# Patient Record
Sex: Male | Born: 1935 | Race: White | Hispanic: No | Smoking: Never smoker
Health system: Southern US, Community
[De-identification: ages and names within clinical notes are randomized; demographics above are authoritative.]

## PROBLEM LIST (undated history)

## (undated) DIAGNOSIS — I10 Essential (primary) hypertension: Secondary | ICD-10-CM

## (undated) DIAGNOSIS — M1990 Unspecified osteoarthritis, unspecified site: Secondary | ICD-10-CM

## (undated) NOTE — *Deleted (*Deleted)
Medical screening examination/treatment/procedure(s) were conducted as a shared visit with non-physician practitioner(s) and myself.  I personally evaluated the patient during the encounter  

---

## 2014-12-21 DIAGNOSIS — R569 Unspecified convulsions: Secondary | ICD-10-CM | POA: Insufficient documentation

## 2015-04-24 DIAGNOSIS — G8929 Other chronic pain: Secondary | ICD-10-CM | POA: Insufficient documentation

## 2015-04-24 DIAGNOSIS — R413 Other amnesia: Secondary | ICD-10-CM | POA: Insufficient documentation

## 2015-04-24 DIAGNOSIS — R251 Tremor, unspecified: Secondary | ICD-10-CM | POA: Insufficient documentation

## 2017-03-25 DIAGNOSIS — R69 Illness, unspecified: Secondary | ICD-10-CM | POA: Diagnosis not present

## 2017-04-10 DIAGNOSIS — R69 Illness, unspecified: Secondary | ICD-10-CM | POA: Diagnosis not present

## 2017-04-10 DIAGNOSIS — N400 Enlarged prostate without lower urinary tract symptoms: Secondary | ICD-10-CM | POA: Insufficient documentation

## 2017-04-10 DIAGNOSIS — R0602 Shortness of breath: Secondary | ICD-10-CM | POA: Diagnosis not present

## 2017-04-10 DIAGNOSIS — M7989 Other specified soft tissue disorders: Secondary | ICD-10-CM | POA: Diagnosis not present

## 2017-04-10 DIAGNOSIS — R030 Elevated blood-pressure reading, without diagnosis of hypertension: Secondary | ICD-10-CM | POA: Diagnosis not present

## 2017-04-10 DIAGNOSIS — R079 Chest pain, unspecified: Secondary | ICD-10-CM | POA: Diagnosis not present

## 2017-04-10 DIAGNOSIS — I10 Essential (primary) hypertension: Secondary | ICD-10-CM | POA: Diagnosis not present

## 2017-04-10 DIAGNOSIS — R0789 Other chest pain: Secondary | ICD-10-CM | POA: Diagnosis not present

## 2017-04-10 DIAGNOSIS — I160 Hypertensive urgency: Secondary | ICD-10-CM | POA: Diagnosis not present

## 2017-04-10 DIAGNOSIS — R42 Dizziness and giddiness: Secondary | ICD-10-CM | POA: Diagnosis not present

## 2017-04-10 DIAGNOSIS — Z79899 Other long term (current) drug therapy: Secondary | ICD-10-CM | POA: Diagnosis not present

## 2017-04-10 DIAGNOSIS — R748 Abnormal levels of other serum enzymes: Secondary | ICD-10-CM | POA: Diagnosis not present

## 2017-04-10 DIAGNOSIS — F17220 Nicotine dependence, chewing tobacco, uncomplicated: Secondary | ICD-10-CM | POA: Diagnosis not present

## 2017-04-11 DIAGNOSIS — I160 Hypertensive urgency: Secondary | ICD-10-CM | POA: Diagnosis not present

## 2017-04-11 DIAGNOSIS — N400 Enlarged prostate without lower urinary tract symptoms: Secondary | ICD-10-CM | POA: Diagnosis not present

## 2017-04-11 DIAGNOSIS — R079 Chest pain, unspecified: Secondary | ICD-10-CM | POA: Diagnosis not present

## 2017-04-12 DIAGNOSIS — N400 Enlarged prostate without lower urinary tract symptoms: Secondary | ICD-10-CM | POA: Diagnosis not present

## 2017-04-12 DIAGNOSIS — I160 Hypertensive urgency: Secondary | ICD-10-CM | POA: Diagnosis not present

## 2017-04-12 DIAGNOSIS — R079 Chest pain, unspecified: Secondary | ICD-10-CM | POA: Diagnosis not present

## 2017-04-24 DIAGNOSIS — Z1509 Genetic susceptibility to other malignant neoplasm: Secondary | ICD-10-CM | POA: Diagnosis not present

## 2017-04-24 DIAGNOSIS — R0602 Shortness of breath: Secondary | ICD-10-CM | POA: Diagnosis not present

## 2017-04-24 DIAGNOSIS — I10 Essential (primary) hypertension: Secondary | ICD-10-CM | POA: Diagnosis not present

## 2017-04-24 DIAGNOSIS — Z8051 Family history of malignant neoplasm of kidney: Secondary | ICD-10-CM | POA: Diagnosis not present

## 2017-04-24 DIAGNOSIS — Z85828 Personal history of other malignant neoplasm of skin: Secondary | ICD-10-CM | POA: Diagnosis not present

## 2017-04-24 DIAGNOSIS — Z1379 Encounter for other screening for genetic and chromosomal anomalies: Secondary | ICD-10-CM | POA: Diagnosis not present

## 2017-04-24 DIAGNOSIS — Z808 Family history of malignant neoplasm of other organs or systems: Secondary | ICD-10-CM | POA: Diagnosis not present

## 2017-04-25 DIAGNOSIS — Z1379 Encounter for other screening for genetic and chromosomal anomalies: Secondary | ICD-10-CM | POA: Diagnosis not present

## 2017-04-25 DIAGNOSIS — I209 Angina pectoris, unspecified: Secondary | ICD-10-CM | POA: Diagnosis not present

## 2017-04-25 DIAGNOSIS — R112 Nausea with vomiting, unspecified: Secondary | ICD-10-CM | POA: Diagnosis not present

## 2017-04-25 DIAGNOSIS — R0602 Shortness of breath: Secondary | ICD-10-CM | POA: Diagnosis not present

## 2017-04-25 DIAGNOSIS — R69 Illness, unspecified: Secondary | ICD-10-CM | POA: Diagnosis not present

## 2017-04-29 DIAGNOSIS — I10 Essential (primary) hypertension: Secondary | ICD-10-CM | POA: Diagnosis not present

## 2017-07-17 DIAGNOSIS — M79644 Pain in right finger(s): Secondary | ICD-10-CM | POA: Diagnosis not present

## 2017-07-17 DIAGNOSIS — I10 Essential (primary) hypertension: Secondary | ICD-10-CM | POA: Diagnosis not present

## 2017-07-17 DIAGNOSIS — W01198A Fall on same level from slipping, tripping and stumbling with subsequent striking against other object, initial encounter: Secondary | ICD-10-CM | POA: Diagnosis not present

## 2017-07-31 DIAGNOSIS — M79644 Pain in right finger(s): Secondary | ICD-10-CM | POA: Diagnosis not present

## 2017-07-31 DIAGNOSIS — I10 Essential (primary) hypertension: Secondary | ICD-10-CM | POA: Diagnosis not present

## 2017-08-25 DIAGNOSIS — M79644 Pain in right finger(s): Secondary | ICD-10-CM | POA: Diagnosis not present

## 2017-08-27 DIAGNOSIS — S62522A Displaced fracture of distal phalanx of left thumb, initial encounter for closed fracture: Secondary | ICD-10-CM | POA: Diagnosis not present

## 2017-09-16 DIAGNOSIS — M25562 Pain in left knee: Secondary | ICD-10-CM | POA: Diagnosis not present

## 2017-09-16 DIAGNOSIS — M25561 Pain in right knee: Secondary | ICD-10-CM | POA: Diagnosis not present

## 2017-09-16 DIAGNOSIS — N401 Enlarged prostate with lower urinary tract symptoms: Secondary | ICD-10-CM | POA: Diagnosis not present

## 2017-09-16 DIAGNOSIS — R252 Cramp and spasm: Secondary | ICD-10-CM | POA: Diagnosis not present

## 2017-09-16 DIAGNOSIS — I10 Essential (primary) hypertension: Secondary | ICD-10-CM | POA: Diagnosis not present

## 2017-09-23 DIAGNOSIS — E612 Magnesium deficiency: Secondary | ICD-10-CM | POA: Diagnosis not present

## 2017-09-23 DIAGNOSIS — R509 Fever, unspecified: Secondary | ICD-10-CM | POA: Diagnosis not present

## 2017-09-23 DIAGNOSIS — R799 Abnormal finding of blood chemistry, unspecified: Secondary | ICD-10-CM | POA: Diagnosis not present

## 2017-09-23 DIAGNOSIS — I700 Atherosclerosis of aorta: Secondary | ICD-10-CM | POA: Diagnosis not present

## 2018-03-22 DIAGNOSIS — R69 Illness, unspecified: Secondary | ICD-10-CM | POA: Diagnosis not present

## 2018-05-25 DIAGNOSIS — Z6830 Body mass index (BMI) 30.0-30.9, adult: Secondary | ICD-10-CM | POA: Diagnosis not present

## 2018-05-25 DIAGNOSIS — Z809 Family history of malignant neoplasm, unspecified: Secondary | ICD-10-CM | POA: Diagnosis not present

## 2018-05-25 DIAGNOSIS — R32 Unspecified urinary incontinence: Secondary | ICD-10-CM | POA: Diagnosis not present

## 2018-05-25 DIAGNOSIS — N400 Enlarged prostate without lower urinary tract symptoms: Secondary | ICD-10-CM | POA: Diagnosis not present

## 2018-05-25 DIAGNOSIS — I10 Essential (primary) hypertension: Secondary | ICD-10-CM | POA: Diagnosis not present

## 2018-05-25 DIAGNOSIS — E669 Obesity, unspecified: Secondary | ICD-10-CM | POA: Diagnosis not present

## 2018-05-25 DIAGNOSIS — Z89421 Acquired absence of other right toe(s): Secondary | ICD-10-CM | POA: Diagnosis not present

## 2018-05-25 DIAGNOSIS — Z823 Family history of stroke: Secondary | ICD-10-CM | POA: Diagnosis not present

## 2018-05-25 DIAGNOSIS — Z7722 Contact with and (suspected) exposure to environmental tobacco smoke (acute) (chronic): Secondary | ICD-10-CM | POA: Diagnosis not present

## 2018-05-25 DIAGNOSIS — Z803 Family history of malignant neoplasm of breast: Secondary | ICD-10-CM | POA: Diagnosis not present

## 2018-08-23 DIAGNOSIS — I10 Essential (primary) hypertension: Secondary | ICD-10-CM | POA: Diagnosis not present

## 2018-08-23 DIAGNOSIS — M25551 Pain in right hip: Secondary | ICD-10-CM | POA: Diagnosis not present

## 2018-08-23 DIAGNOSIS — S8991XA Unspecified injury of right lower leg, initial encounter: Secondary | ICD-10-CM | POA: Diagnosis not present

## 2018-08-23 DIAGNOSIS — M7989 Other specified soft tissue disorders: Secondary | ICD-10-CM | POA: Diagnosis not present

## 2018-08-23 DIAGNOSIS — S79911A Unspecified injury of right hip, initial encounter: Secondary | ICD-10-CM | POA: Diagnosis not present

## 2018-08-23 DIAGNOSIS — S80919A Unspecified superficial injury of unspecified knee, initial encounter: Secondary | ICD-10-CM | POA: Diagnosis not present

## 2018-08-23 DIAGNOSIS — S79912A Unspecified injury of left hip, initial encounter: Secondary | ICD-10-CM | POA: Diagnosis not present

## 2018-08-23 DIAGNOSIS — M25561 Pain in right knee: Secondary | ICD-10-CM | POA: Diagnosis not present

## 2018-08-23 DIAGNOSIS — R52 Pain, unspecified: Secondary | ICD-10-CM | POA: Diagnosis not present

## 2018-09-01 DIAGNOSIS — Z89421 Acquired absence of other right toe(s): Secondary | ICD-10-CM | POA: Diagnosis not present

## 2018-09-01 DIAGNOSIS — E669 Obesity, unspecified: Secondary | ICD-10-CM | POA: Diagnosis not present

## 2018-09-01 DIAGNOSIS — Z6830 Body mass index (BMI) 30.0-30.9, adult: Secondary | ICD-10-CM | POA: Diagnosis not present

## 2018-09-01 DIAGNOSIS — R32 Unspecified urinary incontinence: Secondary | ICD-10-CM | POA: Diagnosis not present

## 2018-09-01 DIAGNOSIS — Z791 Long term (current) use of non-steroidal anti-inflammatories (NSAID): Secondary | ICD-10-CM | POA: Diagnosis not present

## 2018-09-01 DIAGNOSIS — N400 Enlarged prostate without lower urinary tract symptoms: Secondary | ICD-10-CM | POA: Diagnosis not present

## 2018-09-01 DIAGNOSIS — Z89411 Acquired absence of right great toe: Secondary | ICD-10-CM | POA: Diagnosis not present

## 2018-09-01 DIAGNOSIS — I10 Essential (primary) hypertension: Secondary | ICD-10-CM | POA: Diagnosis not present

## 2018-09-01 DIAGNOSIS — G8929 Other chronic pain: Secondary | ICD-10-CM | POA: Diagnosis not present

## 2018-09-01 DIAGNOSIS — Z809 Family history of malignant neoplasm, unspecified: Secondary | ICD-10-CM | POA: Diagnosis not present

## 2018-09-27 DIAGNOSIS — E539 Vitamin B deficiency, unspecified: Secondary | ICD-10-CM | POA: Diagnosis not present

## 2018-09-27 DIAGNOSIS — I10 Essential (primary) hypertension: Secondary | ICD-10-CM | POA: Diagnosis not present

## 2018-09-27 DIAGNOSIS — N401 Enlarged prostate with lower urinary tract symptoms: Secondary | ICD-10-CM | POA: Diagnosis not present

## 2018-09-27 DIAGNOSIS — R296 Repeated falls: Secondary | ICD-10-CM | POA: Diagnosis not present

## 2018-09-30 DIAGNOSIS — N401 Enlarged prostate with lower urinary tract symptoms: Secondary | ICD-10-CM | POA: Diagnosis not present

## 2018-09-30 DIAGNOSIS — R296 Repeated falls: Secondary | ICD-10-CM | POA: Diagnosis not present

## 2018-09-30 DIAGNOSIS — I10 Essential (primary) hypertension: Secondary | ICD-10-CM | POA: Diagnosis not present

## 2018-09-30 DIAGNOSIS — E539 Vitamin B deficiency, unspecified: Secondary | ICD-10-CM | POA: Diagnosis not present

## 2018-10-14 DIAGNOSIS — D519 Vitamin B12 deficiency anemia, unspecified: Secondary | ICD-10-CM | POA: Diagnosis not present

## 2018-10-14 DIAGNOSIS — I10 Essential (primary) hypertension: Secondary | ICD-10-CM | POA: Diagnosis not present

## 2018-10-14 DIAGNOSIS — D529 Folate deficiency anemia, unspecified: Secondary | ICD-10-CM | POA: Diagnosis not present

## 2018-10-14 DIAGNOSIS — E559 Vitamin D deficiency, unspecified: Secondary | ICD-10-CM | POA: Diagnosis not present

## 2018-10-14 DIAGNOSIS — R799 Abnormal finding of blood chemistry, unspecified: Secondary | ICD-10-CM | POA: Diagnosis not present

## 2018-10-25 DIAGNOSIS — R799 Abnormal finding of blood chemistry, unspecified: Secondary | ICD-10-CM | POA: Diagnosis not present

## 2018-10-25 DIAGNOSIS — E559 Vitamin D deficiency, unspecified: Secondary | ICD-10-CM | POA: Diagnosis not present

## 2018-10-25 DIAGNOSIS — Z9119 Patient's noncompliance with other medical treatment and regimen: Secondary | ICD-10-CM | POA: Diagnosis not present

## 2018-12-06 DIAGNOSIS — R0902 Hypoxemia: Secondary | ICD-10-CM | POA: Diagnosis not present

## 2018-12-06 DIAGNOSIS — M25511 Pain in right shoulder: Secondary | ICD-10-CM | POA: Diagnosis not present

## 2018-12-06 DIAGNOSIS — M25519 Pain in unspecified shoulder: Secondary | ICD-10-CM | POA: Diagnosis not present

## 2018-12-06 DIAGNOSIS — R0689 Other abnormalities of breathing: Secondary | ICD-10-CM | POA: Diagnosis not present

## 2018-12-06 DIAGNOSIS — R231 Pallor: Secondary | ICD-10-CM | POA: Diagnosis not present

## 2018-12-06 DIAGNOSIS — S299XXA Unspecified injury of thorax, initial encounter: Secondary | ICD-10-CM | POA: Diagnosis not present

## 2018-12-06 DIAGNOSIS — M546 Pain in thoracic spine: Secondary | ICD-10-CM | POA: Diagnosis not present

## 2018-12-06 DIAGNOSIS — S0990XA Unspecified injury of head, initial encounter: Secondary | ICD-10-CM | POA: Diagnosis not present

## 2018-12-06 DIAGNOSIS — R410 Disorientation, unspecified: Secondary | ICD-10-CM | POA: Diagnosis not present

## 2018-12-06 DIAGNOSIS — R52 Pain, unspecified: Secondary | ICD-10-CM | POA: Diagnosis not present

## 2018-12-14 DIAGNOSIS — E876 Hypokalemia: Secondary | ICD-10-CM | POA: Diagnosis not present

## 2018-12-14 DIAGNOSIS — F0390 Unspecified dementia without behavioral disturbance: Secondary | ICD-10-CM | POA: Diagnosis not present

## 2018-12-14 DIAGNOSIS — R69 Illness, unspecified: Secondary | ICD-10-CM | POA: Diagnosis not present

## 2018-12-14 DIAGNOSIS — Z79899 Other long term (current) drug therapy: Secondary | ICD-10-CM | POA: Diagnosis not present

## 2018-12-14 DIAGNOSIS — E785 Hyperlipidemia, unspecified: Secondary | ICD-10-CM | POA: Diagnosis not present

## 2018-12-14 DIAGNOSIS — Z7982 Long term (current) use of aspirin: Secondary | ICD-10-CM | POA: Diagnosis not present

## 2018-12-14 DIAGNOSIS — R42 Dizziness and giddiness: Secondary | ICD-10-CM | POA: Diagnosis not present

## 2018-12-14 DIAGNOSIS — I340 Nonrheumatic mitral (valve) insufficiency: Secondary | ICD-10-CM

## 2018-12-14 DIAGNOSIS — Z6822 Body mass index (BMI) 22.0-22.9, adult: Secondary | ICD-10-CM | POA: Diagnosis not present

## 2018-12-14 DIAGNOSIS — I10 Essential (primary) hypertension: Secondary | ICD-10-CM | POA: Diagnosis not present

## 2018-12-14 DIAGNOSIS — I361 Nonrheumatic tricuspid (valve) insufficiency: Secondary | ICD-10-CM

## 2018-12-14 DIAGNOSIS — I4891 Unspecified atrial fibrillation: Secondary | ICD-10-CM | POA: Diagnosis not present

## 2018-12-14 DIAGNOSIS — R55 Syncope and collapse: Secondary | ICD-10-CM | POA: Diagnosis not present

## 2018-12-15 ENCOUNTER — Telehealth: Payer: Self-pay

## 2018-12-15 DIAGNOSIS — E785 Hyperlipidemia, unspecified: Secondary | ICD-10-CM | POA: Diagnosis not present

## 2018-12-15 DIAGNOSIS — F0390 Unspecified dementia without behavioral disturbance: Secondary | ICD-10-CM | POA: Diagnosis not present

## 2018-12-15 DIAGNOSIS — R55 Syncope and collapse: Secondary | ICD-10-CM | POA: Diagnosis not present

## 2018-12-15 DIAGNOSIS — I4891 Unspecified atrial fibrillation: Secondary | ICD-10-CM

## 2018-12-15 DIAGNOSIS — I10 Essential (primary) hypertension: Secondary | ICD-10-CM | POA: Diagnosis not present

## 2018-12-15 DIAGNOSIS — R69 Illness, unspecified: Secondary | ICD-10-CM | POA: Diagnosis not present

## 2018-12-15 NOTE — Telephone Encounter (Signed)
Patient is currently in hospital due for release today. Dr. Docia Furl requested 2 week zio due to afib/syncope. Printed patient records/insurance information and forwarded to P.Perzee to set patient up. Scheduled 2 week f/u.

## 2018-12-16 ENCOUNTER — Telehealth: Payer: Self-pay

## 2018-12-16 ENCOUNTER — Other Ambulatory Visit (INDEPENDENT_AMBULATORY_CARE_PROVIDER_SITE_OTHER): Payer: Medicare HMO

## 2018-12-16 DIAGNOSIS — I4891 Unspecified atrial fibrillation: Secondary | ICD-10-CM | POA: Diagnosis not present

## 2018-12-16 NOTE — Telephone Encounter (Signed)
Called to verify that patient was able to get monitor on with no issues. Registered unit with [employer]. Scheduled for 2 wk f/u.

## 2018-12-16 NOTE — Addendum Note (Signed)
Addended by: Beckey Rutter on: 12/16/2018 10:21 AM   Modules accepted: Orders

## 2018-12-30 ENCOUNTER — Ambulatory Visit (INDEPENDENT_AMBULATORY_CARE_PROVIDER_SITE_OTHER): Payer: Medicare HMO | Admitting: Cardiology

## 2018-12-30 ENCOUNTER — Other Ambulatory Visit: Payer: Self-pay

## 2018-12-30 ENCOUNTER — Encounter: Payer: Self-pay | Admitting: Cardiology

## 2018-12-30 VITALS — BP 140/62 | HR 76 | Ht 72.0 in

## 2018-12-30 DIAGNOSIS — I10 Essential (primary) hypertension: Secondary | ICD-10-CM | POA: Diagnosis not present

## 2018-12-30 DIAGNOSIS — R55 Syncope and collapse: Secondary | ICD-10-CM | POA: Diagnosis not present

## 2018-12-30 DIAGNOSIS — E785 Hyperlipidemia, unspecified: Secondary | ICD-10-CM | POA: Insufficient documentation

## 2018-12-30 DIAGNOSIS — I340 Nonrheumatic mitral (valve) insufficiency: Secondary | ICD-10-CM

## 2018-12-30 DIAGNOSIS — M1990 Unspecified osteoarthritis, unspecified site: Secondary | ICD-10-CM | POA: Insufficient documentation

## 2018-12-30 NOTE — Addendum Note (Signed)
Addended by: Beckey Rutter on: 12/30/2018 10:38 AM   Modules accepted: Orders

## 2018-12-30 NOTE — Patient Instructions (Addendum)
Medication Instructions:  Your physician recommends that you continue on your current medications as directed. Please refer to the Current Medication list given to you today.  If you need a refill on your cardiac medications before your next appointment, please call your pharmacy.   Lab work: Your physician recommends that you have a TSH drawn today.  If you have labs (blood work) drawn today and your tests are completely normal, you will receive your results only by: Marland Kitchen MyChart Message (if you have MyChart) OR . A paper copy in the mail If you have any lab test that is abnormal or we need to change your treatment, we will call you to review the results.  Testing/Procedures: YOU had an EKG performed today  Follow-Up: At [facility], you and your health needs are our priority.  As part of our continuing mission to provide you with exceptional heart care, we have created designated Provider Care Teams.  These Care Teams include your primary Cardiologist (physician) and Advanced Practice Providers (APPs -  Physician Assistants and Nurse Practitioners) who all work together to provide you with the care you need, when you need it. You will need a follow up appointment in 4 months.

## 2018-12-30 NOTE — Progress Notes (Addendum)
Cardiology Office Note:    Date:  12/30/2018   ID:  Christian Pages., DOB 1932-07-13, MRN [id number]  PCP:  Barnie Mort, NP  Cardiologist:  Jenean Lindau, MD   Referring MD: Barnie Mort, NP    ASSESSMENT:    1. Syncope and collapse   2. Essential hypertension   3. Moderate mitral regurgitation    PLAN:    In order of problems listed above:  1. Syncope and collapse: Christian Warren is being evaluated for this.  I did not see her TSH in the chart and we will get this blood work done today.  His blood pressure stable.  He is on blood pressure medication amlodipine 10 mg daily according to the daughter.  He is undergoing 14-day ZIO monitoring after hospital discharge and we will review this. 2. Essential hypertension: Blood pressure stable 3. Moderate mitral regurgitation: I discussed this with the Christian Warren and daughter extensively questions were answered to their satisfaction.  Medical management at this time 4 months follow-up or earlier if he has any concerns.  In view of recurrent syncopal episodes he was advised not to drive.  His syncope will also need to be evaluated by his primary care physician for reasons other than cardiac.  I advised him about this.   Medication Adjustments/Labs and Tests Ordered: Current medicines are reviewed at length with the Christian Warren today.  Concerns regarding medicines are outlined above.  No orders of the defined types were placed in this encounter.  No orders of the defined types were placed in this encounter.    History of Present Illness:    Christian Beg. is a 85 y.o. male who is being seen today for the evaluation of atrial fibrillation and syncope at the request of Barnie Mort, NP.  Christian Warren is a pleasant 85 year old male.  He is brought in by his daughter.  He has history of essential hypertension.  He had a passing out spell and was admitted to [facility].  He has had multiple such spells in the past.  No chest pain orthopnea or  PND.  He was found to be in atrial fibrillation with well-controlled ventricular rates, moderate mitral regurgitation and left atrial enlargement.  He is unsteady on his gait as he walks into the clinic.  At the time of my evaluation, the Christian Warren is alert awake oriented and in no distress.  No past medical history on file.  [place] records were reviewed extensively Current Medications: Current Meds  Medication Sig  . tamsulosin (FLOMAX) 0.4 MG CAPS capsule Take 1 capsule by mouth daily.     Allergies:   Christian Warren has no known allergies.   Social History   Socioeconomic History  . Marital status: Divorced    Spouse name: Not on file  . Number of children: Not on file  . Years of education: Not on file  . Highest education level: Not on file  Occupational History  . Not on file  Social Needs  . Financial resource strain: Not on file  . Food insecurity    Worry: Not on file    Inability: Not on file  . Transportation needs    Medical: Not on file    Non-medical: Not on file  Tobacco Use  . Smoking status: Never Smoker  . Smokeless tobacco: Never Used  Substance and Sexual Activity  . Alcohol use: Not on file  . Drug use: Not on file  . Sexual activity: Not on file  Lifestyle  . Physical activity    Days per week: Not on file    Minutes per session: Not on file  . Stress: Not on file  Relationships  . Social Musicianconnections    Talks on phone: Not on file    Gets together: Not on file    Attends religious service: Not on file    Active member of club or organization: Not on file    Attends meetings of clubs or organizations: Not on file    Relationship status: Not on file  Other Topics Concern  . Not on file  Social History Narrative  . Not on file     Family History: The Christian Warren's family history is not on file.  ROS:   Please see the history of present illness.    All other systems reviewed and are negative.  EKGs/Labs/Other Studies Reviewed:    The  following studies were reviewed today[facility].  Christian Warren has moderate mitral regurgitation.  Ejection fraction is normal.   Recent Labs: No results found for requested labs within last 8760 hours.  Recent Lipid Panel No results found for: CHOL, TRIG, HDL, CHOLHDL, VLDL, LDLCALC, LDLDIRECT  Physical Exam:    VS:  BP 140/62 (BP Location: Left Arm, Christian Warren Position: Sitting, Cuff Size: Normal)   Pulse 76   Ht 6' (1.829 m)   SpO2 98%     Wt Readings from Last 3 Encounters:  No data found for Wt     GEN: Christian Warren is in no acute distress HEENT: Normal NECK: No JVD; No carotid bruits LYMPHATICS: No lymphadenopathy CARDIAC: S1 S2 irregular, 2/6 systolic murmur at the apex. RESPIRATORY:  Clear to auscultation without rales, wheezing or rhonchi  ABDOMEN: Soft, non-tender, non-distended MUSCULOSKELETAL:  No edema; No deformity  SKIN: Warm and dry NEUROLOGIC:  Alert and oriented x 3 PSYCHIATRIC:  Normal affect    Signed, Garwin Brothersajan R Kasai Beltran, MD  12/30/2018 10:28 AM    [facility]

## 2018-12-31 ENCOUNTER — Telehealth: Payer: Self-pay

## 2018-12-31 LAB — TSH: TSH: 3.8 u[IU]/mL (ref 0.450–4.500)

## 2018-12-31 NOTE — Telephone Encounter (Signed)
-----   Message from Rajan R Revankar, MD sent at 12/31/2018  8:02 AM EDT ----- The results of the study is unremarkable. Please inform patient. I will discuss in detail at next appointment. Cc  primary care/referring physician Rajan R Revankar, MD 12/31/2018 8:02 AM 

## 2019-01-04 DIAGNOSIS — I4819 Other persistent atrial fibrillation: Secondary | ICD-10-CM | POA: Diagnosis not present

## 2019-01-04 DIAGNOSIS — I472 Ventricular tachycardia: Secondary | ICD-10-CM | POA: Diagnosis not present

## 2019-01-04 DIAGNOSIS — I4891 Unspecified atrial fibrillation: Secondary | ICD-10-CM | POA: Diagnosis not present

## 2019-01-12 ENCOUNTER — Telehealth: Payer: Self-pay

## 2019-01-12 DIAGNOSIS — R55 Syncope and collapse: Secondary | ICD-10-CM

## 2019-01-12 DIAGNOSIS — I10 Essential (primary) hypertension: Secondary | ICD-10-CM

## 2019-01-12 NOTE — Telephone Encounter (Signed)
Patient notified to come to [facility] for repeat blood work and scheduled Lexi. Spoke with his niece Malachy Mood with date/time. Patient will request printed copy of lexi instruction on day of lab visit.  Copy of results sent to PCP.

## 2019-01-12 NOTE — Telephone Encounter (Signed)
-----   Message from Austin Miles, RN sent at 01/11/2019 11:26 AM EDT -----  ----- Message ----- From: Jenean Lindau, MD Sent: 01/11/2019  11:06 AM EDT To: Susie Cassette, RN  In view of the above I would like to get a Chem-7, magnesium level, TSH and Lexiscan sestamibi in the next week or 2. Jenean Lindau, MD 01/11/2019 11:06 AM

## 2019-02-17 ENCOUNTER — Telehealth (HOSPITAL_COMMUNITY): Payer: Self-pay | Admitting: *Deleted

## 2019-02-17 NOTE — Telephone Encounter (Signed)
Left message on voicemail in reference to upcoming appointment scheduled for 02/22/19. Phone number given for a call back so details instructions can be given. Christian Warren   

## 2019-04-19 ENCOUNTER — Telehealth: Payer: Self-pay

## 2019-04-19 DIAGNOSIS — I10 Essential (primary) hypertension: Secondary | ICD-10-CM

## 2019-04-19 NOTE — Telephone Encounter (Signed)
-----   Message from Jenean Lindau, MD sent at 04/12/2019 12:52 PM EST ----- Not sure whether his potassium was replaced.  Please check with him if not bring him back for a Chem-7 I will let his primary care handle it depend on patient choice ----- Message ----- From: Caleen Essex Sent: 04/12/2019  10:07 AM EST To: Jenean Lindau, MD

## 2019-04-19 NOTE — Telephone Encounter (Signed)
Patient will come into office 11/11 or 11/12 for repeat BMP.

## 2019-04-20 DIAGNOSIS — I10 Essential (primary) hypertension: Secondary | ICD-10-CM | POA: Diagnosis not present

## 2019-04-20 DIAGNOSIS — Z136 Encounter for screening for cardiovascular disorders: Secondary | ICD-10-CM | POA: Diagnosis not present

## 2019-04-20 DIAGNOSIS — E559 Vitamin D deficiency, unspecified: Secondary | ICD-10-CM | POA: Diagnosis not present

## 2019-04-20 DIAGNOSIS — I4891 Unspecified atrial fibrillation: Secondary | ICD-10-CM | POA: Diagnosis not present

## 2019-05-02 ENCOUNTER — Ambulatory Visit (INDEPENDENT_AMBULATORY_CARE_PROVIDER_SITE_OTHER): Payer: Medicare HMO | Admitting: Cardiology

## 2019-05-02 ENCOUNTER — Other Ambulatory Visit: Payer: Self-pay

## 2019-05-02 ENCOUNTER — Encounter: Payer: Self-pay | Admitting: Cardiology

## 2019-05-02 VITALS — BP 146/72 | HR 73 | Ht 72.0 in | Wt 212.8 lb

## 2019-05-02 DIAGNOSIS — I10 Essential (primary) hypertension: Secondary | ICD-10-CM | POA: Diagnosis not present

## 2019-05-02 DIAGNOSIS — R55 Syncope and collapse: Secondary | ICD-10-CM | POA: Diagnosis not present

## 2019-05-02 DIAGNOSIS — R413 Other amnesia: Secondary | ICD-10-CM | POA: Diagnosis not present

## 2019-05-02 NOTE — Patient Instructions (Signed)
Medication Instructions:  Your physician recommends that you continue on your current medications as directed. Please refer to the Current Medication list given to you today.  *If you need a refill on your cardiac medications before your next appointment, please call your pharmacy*  Lab Work: None.  If you have labs (blood work) drawn today and your tests are completely normal, you will receive your results only by: . MyChart Message (if you have MyChart) OR . A paper copy in the mail If you have any lab test that is abnormal or we need to change your treatment, we will call you to review the results.  Testing/Procedures: None.   Follow-Up: At [facility], you and your health needs are our priority.  As part of our continuing mission to provide you with exceptional heart care, we have created designated Provider Care Teams.  These Care Teams include your primary Cardiologist (physician) and Advanced Practice Providers (APPs -  Physician Assistants and Nurse Practitioners) who all work together to provide you with the care you need, when you need it.  Your next appointment:   3 month(s)  The format for your next appointment:   In Person  Provider:   Rajan Revankar, MD  Other Instructions   

## 2019-05-02 NOTE — Progress Notes (Signed)
Cardiology Office Note:    Date:  05/02/2019   ID:  Christian Gentile., DOB 07/25/1932, MRN [id number]  PCP:  Retia Passe, NP  Cardiologist:  Garwin Brothers, MD   Referring MD: Retia Passe, NP    ASSESSMENT:    No diagnosis found. PLAN:    In order of problems listed above:  1. Nonsustained ventricular tachycardia: I discussed findings with the patient at extensive length.  Echocardiogram was unremarkable.  His blood work was recently done by his primary care physician and he was told that it was fine.  I discussed with him about stress testing in view of nonsustained ventricular tachycardia him he is not keen on it and respect his wishes.  Risks including fatal and fatal arrhythmias were discussed and he vocalized understanding and I discussed this with his needs to. 2. Essential hypertension: Blood pressure stable.  I would not put him on her beta-blocker at this time because of his minimum heart rate being fairly low. 3. Patient will be seen in follow-up appointment in 3 months or earlier if the patient has any concerns    Medication Adjustments/Labs and Tests Ordered: Current medicines are reviewed at length with the patient today.  Concerns regarding medicines are outlined above.  No orders of the defined types were placed in this encounter.  No orders of the defined types were placed in this encounter.    No chief complaint on file.    History of Present Illness:    Christian Mom. is a 85 y.o. male.  Patient has past medical history of essential hypertension.  He was evaluated for syncope and blood work was unremarkable.  He denies any chest pain orthopnea or PND.  His echocardiogram was unremarkable and Holter monitoring revealed nonsustained ventricular tachycardia and he is here for follow-up.  History reviewed. No pertinent past medical history.  History reviewed. No pertinent surgical history.  Current Medications: No outpatient medications have been  marked as taking for the 05/02/19 encounter (Appointment) with Courtenay Creger, Aundra Dubin, MD.     Allergies:   Patient has no known allergies.   Social History   Socioeconomic History  . Marital status: Divorced    Spouse name: Not on file  . Number of children: Not on file  . Years of education: Not on file  . Highest education level: Not on file  Occupational History  . Not on file  Social Needs  . Financial resource strain: Not on file  . Food insecurity    Worry: Not on file    Inability: Not on file  . Transportation needs    Medical: Not on file    Non-medical: Not on file  Tobacco Use  . Smoking status: Never Smoker  . Smokeless tobacco: Never Used  Substance and Sexual Activity  . Alcohol use: Not on file  . Drug use: Not on file  . Sexual activity: Not on file  Lifestyle  . Physical activity    Days per week: Not on file    Minutes per session: Not on file  . Stress: Not on file  Relationships  . Social Musician on phone: Not on file    Gets together: Not on file    Attends religious service: Not on file    Active member of club or organization: Not on file    Attends meetings of clubs or organizations: Not on file    Relationship status: Not on file  Other  Topics Concern  . Not on file  Social History Narrative  . Not on file     Family History: The patient's family history is not on file.  ROS:   Please see the history of present illness.    All other systems reviewed and are negative.  EKGs/Labs/Other Studies Reviewed:    The following studies were reviewed today:   EVENT MONITOR REPORT:  Patient was monitored from 12/15/2018 to 12/29/2018. Indication:                    Atrial fibrillation Ordering physician:  Jenean Lindau, MD  Referring physician:        Jenean Lindau, MD    Baseline rhythm: Fibrillation  Minimum heart rate: 36 BPM.  Average heart rate: 73 BPM.  Maximal heart rate 178 BPM.  Atrial arrhythmia: Atrial  fibrillation  Ventricular arrhythmia: PVCs seen.  2 nonsustained ventricular tachycardia's, longest was 7 beats  Conduction abnormality: None significant  Symptoms: None significant   Conclusion:  Atrial fibrillation.  2 nonsustained ventricular tachycardia episodes, longest was 7 beats.  Interpreting  cardiologist: Jenean Lindau, MD  Date: 01/09/2019 12:37 AM   Recent Labs: 12/30/2018: TSH 3.800  Recent Lipid Panel No results found for: CHOL, TRIG, HDL, CHOLHDL, VLDL, LDLCALC, LDLDIRECT  Physical Exam:    VS:  There were no vitals taken for this visit.    Wt Readings from Last 3 Encounters:  No data found for Wt     GEN: Patient is in no acute distress HEENT: Normal NECK: No JVD; No carotid bruits LYMPHATICS: No lymphadenopathy CARDIAC: Hear sounds regular, 2/6 systolic murmur at the apex. RESPIRATORY:  Clear to auscultation without rales, wheezing or rhonchi  ABDOMEN: Soft, non-tender, non-distended MUSCULOSKELETAL:  No edema; No deformity  SKIN: Warm and dry NEUROLOGIC:  Alert and oriented x 3 PSYCHIATRIC:  Normal affect   Signed, Jenean Lindau, MD  05/02/2019 9:50 AM    Christian Warren

## 2019-08-02 ENCOUNTER — Ambulatory Visit: Payer: Medicare HMO | Admitting: Cardiology

## 2020-01-03 DIAGNOSIS — Z9119 Patient's noncompliance with other medical treatment and regimen: Secondary | ICD-10-CM | POA: Diagnosis not present

## 2020-01-03 DIAGNOSIS — R296 Repeated falls: Secondary | ICD-10-CM | POA: Diagnosis not present

## 2020-01-03 DIAGNOSIS — E539 Vitamin B deficiency, unspecified: Secondary | ICD-10-CM | POA: Diagnosis not present

## 2020-01-03 DIAGNOSIS — E559 Vitamin D deficiency, unspecified: Secondary | ICD-10-CM | POA: Diagnosis not present

## 2020-01-03 DIAGNOSIS — I10 Essential (primary) hypertension: Secondary | ICD-10-CM | POA: Diagnosis not present

## 2020-01-31 DIAGNOSIS — S3993XA Unspecified injury of pelvis, initial encounter: Secondary | ICD-10-CM | POA: Diagnosis not present

## 2020-01-31 DIAGNOSIS — S51811A Laceration without foreign body of right forearm, initial encounter: Secondary | ICD-10-CM | POA: Diagnosis not present

## 2020-01-31 DIAGNOSIS — R531 Weakness: Secondary | ICD-10-CM | POA: Diagnosis not present

## 2020-01-31 DIAGNOSIS — S0990XA Unspecified injury of head, initial encounter: Secondary | ICD-10-CM | POA: Diagnosis not present

## 2020-01-31 DIAGNOSIS — S199XXA Unspecified injury of neck, initial encounter: Secondary | ICD-10-CM | POA: Diagnosis not present

## 2020-01-31 DIAGNOSIS — S299XXA Unspecified injury of thorax, initial encounter: Secondary | ICD-10-CM | POA: Diagnosis not present

## 2020-02-04 ENCOUNTER — Emergency Department (HOSPITAL_COMMUNITY): Payer: Medicare HMO

## 2020-02-04 ENCOUNTER — Encounter (HOSPITAL_COMMUNITY): Payer: Self-pay

## 2020-02-04 ENCOUNTER — Other Ambulatory Visit: Payer: Self-pay

## 2020-02-04 ENCOUNTER — Inpatient Hospital Stay (HOSPITAL_COMMUNITY)
Admission: EM | Admit: 2020-02-04 | Discharge: 2020-02-08 | DRG: 641 | Disposition: A | Discharge status: Skilled Nursing Facility | Payer: Medicare HMO | Attending: Internal Medicine | Admitting: Internal Medicine

## 2020-02-04 DIAGNOSIS — S32040A Wedge compression fracture of fourth lumbar vertebra, initial encounter for closed fracture: Secondary | ICD-10-CM | POA: Diagnosis not present

## 2020-02-04 DIAGNOSIS — R748 Abnormal levels of other serum enzymes: Secondary | ICD-10-CM

## 2020-02-04 DIAGNOSIS — W010XXA Fall on same level from slipping, tripping and stumbling without subsequent striking against object, initial encounter: Secondary | ICD-10-CM | POA: Diagnosis present

## 2020-02-04 DIAGNOSIS — I10 Essential (primary) hypertension: Secondary | ICD-10-CM | POA: Diagnosis present

## 2020-02-04 DIAGNOSIS — E538 Deficiency of other specified B group vitamins: Secondary | ICD-10-CM | POA: Diagnosis present

## 2020-02-04 DIAGNOSIS — R531 Weakness: Secondary | ICD-10-CM | POA: Diagnosis not present

## 2020-02-04 DIAGNOSIS — Z7982 Long term (current) use of aspirin: Secondary | ICD-10-CM | POA: Diagnosis not present

## 2020-02-04 DIAGNOSIS — E785 Hyperlipidemia, unspecified: Secondary | ICD-10-CM | POA: Diagnosis present

## 2020-02-04 DIAGNOSIS — Z23 Encounter for immunization: Secondary | ICD-10-CM

## 2020-02-04 DIAGNOSIS — Z79899 Other long term (current) drug therapy: Secondary | ICD-10-CM | POA: Diagnosis not present

## 2020-02-04 DIAGNOSIS — D696 Thrombocytopenia, unspecified: Secondary | ICD-10-CM | POA: Diagnosis present

## 2020-02-04 DIAGNOSIS — S8011XA Contusion of right lower leg, initial encounter: Secondary | ICD-10-CM | POA: Diagnosis not present

## 2020-02-04 DIAGNOSIS — S2221XA Fracture of manubrium, initial encounter for closed fracture: Secondary | ICD-10-CM | POA: Diagnosis not present

## 2020-02-04 DIAGNOSIS — R52 Pain, unspecified: Secondary | ICD-10-CM | POA: Diagnosis not present

## 2020-02-04 DIAGNOSIS — S40021A Contusion of right upper arm, initial encounter: Secondary | ICD-10-CM | POA: Diagnosis not present

## 2020-02-04 DIAGNOSIS — M2550 Pain in unspecified joint: Secondary | ICD-10-CM | POA: Diagnosis not present

## 2020-02-04 DIAGNOSIS — M25562 Pain in left knee: Secondary | ICD-10-CM | POA: Diagnosis not present

## 2020-02-04 DIAGNOSIS — Z20822 Contact with and (suspected) exposure to covid-19: Secondary | ICD-10-CM | POA: Diagnosis present

## 2020-02-04 DIAGNOSIS — R5381 Other malaise: Secondary | ICD-10-CM | POA: Diagnosis not present

## 2020-02-04 DIAGNOSIS — S2222XA Fracture of body of sternum, initial encounter for closed fracture: Secondary | ICD-10-CM | POA: Diagnosis present

## 2020-02-04 DIAGNOSIS — F17220 Nicotine dependence, chewing tobacco, uncomplicated: Secondary | ICD-10-CM | POA: Diagnosis present

## 2020-02-04 DIAGNOSIS — R000 Tachycardia, unspecified: Secondary | ICD-10-CM | POA: Diagnosis not present

## 2020-02-04 DIAGNOSIS — M6282 Rhabdomyolysis: Secondary | ICD-10-CM | POA: Diagnosis present

## 2020-02-04 DIAGNOSIS — S80212A Abrasion, left knee, initial encounter: Secondary | ICD-10-CM | POA: Diagnosis not present

## 2020-02-04 DIAGNOSIS — S0990XA Unspecified injury of head, initial encounter: Secondary | ICD-10-CM | POA: Diagnosis not present

## 2020-02-04 DIAGNOSIS — Y92002 Bathroom of unspecified non-institutional (private) residence single-family (private) house as the place of occurrence of the external cause: Secondary | ICD-10-CM

## 2020-02-04 DIAGNOSIS — W19XXXA Unspecified fall, initial encounter: Secondary | ICD-10-CM

## 2020-02-04 DIAGNOSIS — S299XXA Unspecified injury of thorax, initial encounter: Secondary | ICD-10-CM | POA: Diagnosis not present

## 2020-02-04 DIAGNOSIS — D539 Nutritional anemia, unspecified: Secondary | ICD-10-CM | POA: Diagnosis present

## 2020-02-04 DIAGNOSIS — E860 Dehydration: Secondary | ICD-10-CM | POA: Diagnosis not present

## 2020-02-04 DIAGNOSIS — S59901A Unspecified injury of right elbow, initial encounter: Secondary | ICD-10-CM | POA: Diagnosis not present

## 2020-02-04 DIAGNOSIS — N179 Acute kidney failure, unspecified: Secondary | ICD-10-CM | POA: Diagnosis not present

## 2020-02-04 DIAGNOSIS — S3993XA Unspecified injury of pelvis, initial encounter: Secondary | ICD-10-CM | POA: Diagnosis not present

## 2020-02-04 DIAGNOSIS — S8012XA Contusion of left lower leg, initial encounter: Secondary | ICD-10-CM | POA: Diagnosis not present

## 2020-02-04 DIAGNOSIS — S3991XA Unspecified injury of abdomen, initial encounter: Secondary | ICD-10-CM | POA: Diagnosis not present

## 2020-02-04 DIAGNOSIS — E876 Hypokalemia: Secondary | ICD-10-CM | POA: Diagnosis present

## 2020-02-04 DIAGNOSIS — M25522 Pain in left elbow: Secondary | ICD-10-CM | POA: Diagnosis not present

## 2020-02-04 DIAGNOSIS — S8992XA Unspecified injury of left lower leg, initial encounter: Secondary | ICD-10-CM | POA: Diagnosis not present

## 2020-02-04 DIAGNOSIS — S2222XD Fracture of body of sternum, subsequent encounter for fracture with routine healing: Secondary | ICD-10-CM | POA: Diagnosis not present

## 2020-02-04 DIAGNOSIS — R0902 Hypoxemia: Secondary | ICD-10-CM | POA: Diagnosis not present

## 2020-02-04 DIAGNOSIS — Z7401 Bed confinement status: Secondary | ICD-10-CM | POA: Diagnosis not present

## 2020-02-04 DIAGNOSIS — S199XXA Unspecified injury of neck, initial encounter: Secondary | ICD-10-CM | POA: Diagnosis not present

## 2020-02-04 DIAGNOSIS — R102 Pelvic and perineal pain: Secondary | ICD-10-CM | POA: Diagnosis not present

## 2020-02-04 DIAGNOSIS — N401 Enlarged prostate with lower urinary tract symptoms: Secondary | ICD-10-CM | POA: Diagnosis present

## 2020-02-04 DIAGNOSIS — S40022A Contusion of left upper arm, initial encounter: Secondary | ICD-10-CM | POA: Diagnosis not present

## 2020-02-04 DIAGNOSIS — S32040D Wedge compression fracture of fourth lumbar vertebra, subsequent encounter for fracture with routine healing: Secondary | ICD-10-CM | POA: Diagnosis not present

## 2020-02-04 DIAGNOSIS — R338 Other retention of urine: Secondary | ICD-10-CM | POA: Diagnosis present

## 2020-02-04 DIAGNOSIS — S80211A Abrasion, right knee, initial encounter: Secondary | ICD-10-CM | POA: Diagnosis present

## 2020-02-04 DIAGNOSIS — S59902A Unspecified injury of left elbow, initial encounter: Secondary | ICD-10-CM | POA: Diagnosis not present

## 2020-02-04 HISTORY — DX: Unspecified osteoarthritis, unspecified site: M19.90

## 2020-02-04 HISTORY — DX: Essential (primary) hypertension: I10

## 2020-02-04 LAB — PROTIME-INR
INR: 1 (ref 0.8–1.2)
Prothrombin Time: 12.7 seconds (ref 11.4–15.2)

## 2020-02-04 LAB — COMPREHENSIVE METABOLIC PANEL
ALT: 46 U/L — ABNORMAL HIGH (ref 0–44)
AST: 116 U/L — ABNORMAL HIGH (ref 15–41)
Albumin: 4.2 g/dL (ref 3.5–5.0)
Alkaline Phosphatase: 42 U/L (ref 38–126)
Anion gap: 16 — ABNORMAL HIGH (ref 5–15)
BUN: 40 mg/dL — ABNORMAL HIGH (ref 8–23)
CO2: 21 mmol/L — ABNORMAL LOW (ref 22–32)
Calcium: 9.6 mg/dL (ref 8.9–10.3)
Chloride: 102 mmol/L (ref 98–111)
Creatinine, Ser: 1.41 mg/dL — ABNORMAL HIGH (ref 0.61–1.24)
GFR calc Af Amer: 53 mL/min — ABNORMAL LOW (ref >60)
GFR calc non Af Amer: 45 mL/min — ABNORMAL LOW (ref >60)
Glucose, Bld: 89 mg/dL (ref 70–99)
Potassium: 2.9 mmol/L — ABNORMAL LOW (ref 3.5–5.1)
Sodium: 139 mmol/L (ref 135–145)
Total Bilirubin: 1.8 mg/dL — ABNORMAL HIGH (ref 0.3–1.2)
Total Protein: 6.7 g/dL (ref 6.5–8.1)

## 2020-02-04 LAB — SARS CORONAVIRUS 2 BY RT PCR (HOSPITAL ORDER, PERFORMED IN ~~LOC~~ HOSPITAL LAB): SARS Coronavirus 2: NEGATIVE

## 2020-02-04 LAB — URINALYSIS, ROUTINE W REFLEX MICROSCOPIC
Bacteria, UA: NONE SEEN
Bilirubin Urine: NEGATIVE
Glucose, UA: NEGATIVE mg/dL
Ketones, ur: 20 mg/dL — AB
Leukocytes,Ua: NEGATIVE
Nitrite: NEGATIVE
Protein, ur: 100 mg/dL — AB
Specific Gravity, Urine: 1.019 (ref 1.005–1.030)
pH: 5 (ref 5.0–8.0)

## 2020-02-04 LAB — MAGNESIUM: Magnesium: 1.9 mg/dL (ref 1.7–2.4)

## 2020-02-04 LAB — CBC
HCT: 35.7 % — ABNORMAL LOW (ref 39.0–52.0)
Hemoglobin: 12.1 g/dL — ABNORMAL LOW (ref 13.0–17.0)
MCH: 34.4 pg — ABNORMAL HIGH (ref 26.0–34.0)
MCHC: 33.9 g/dL (ref 30.0–36.0)
MCV: 101.4 fL — ABNORMAL HIGH (ref 80.0–100.0)
Platelets: 135 10*3/uL — ABNORMAL LOW (ref 150–400)
RBC: 3.52 MIL/uL — ABNORMAL LOW (ref 4.22–5.81)
RDW: 14.8 % (ref 11.5–15.5)
WBC: 7 10*3/uL (ref 4.0–10.5)
nRBC: 0 % (ref 0.0–0.2)

## 2020-02-04 LAB — CK: Total CK: 1064 U/L — ABNORMAL HIGH (ref 49–397)

## 2020-02-04 MED ORDER — SODIUM CHLORIDE 0.9 % IV SOLN: INTRAVENOUS | Status: DC

## 2020-02-04 MED ORDER — ACETAMINOPHEN 325 MG PO TABS
650.0000 mg | ORAL_TABLET | Freq: Four times a day (QID) | ORAL | Status: DC | PRN
  Administered 2020-02-04 – 2020-02-07 (×2): 650 mg via ORAL
  Filled 2020-02-04 (×2): qty 2

## 2020-02-04 MED ORDER — LACTATED RINGERS IV BOLUS
500.0000 mL | Freq: Once | INTRAVENOUS | Status: AC
  Administered 2020-02-04: 500 mL via INTRAVENOUS

## 2020-02-04 MED ORDER — TETANUS-DIPHTH-ACELL PERTUSSIS 5-2.5-18.5 LF-MCG/0.5 IM SUSP
0.5000 mL | Freq: Once | INTRAMUSCULAR | Status: AC
  Administered 2020-02-04: 0.5 mL via INTRAMUSCULAR
  Filled 2020-02-04: qty 0.5

## 2020-02-04 MED ORDER — ACETAMINOPHEN 650 MG RE SUPP: 650.0000 mg | Freq: Four times a day (QID) | RECTAL | Status: DC | PRN

## 2020-02-04 MED ORDER — FENTANYL CITRATE (PF) 100 MCG/2ML IJ SOLN
50.0000 ug | Freq: Once | INTRAMUSCULAR | Status: AC
  Administered 2020-02-04: 50 ug via INTRAVENOUS
  Filled 2020-02-04: qty 2

## 2020-02-04 MED ORDER — FENTANYL CITRATE (PF) 100 MCG/2ML IJ SOLN: 50.0000 ug | Freq: Once | INTRAMUSCULAR | Status: DC

## 2020-02-04 MED ORDER — ENOXAPARIN SODIUM 40 MG/0.4ML ~~LOC~~ SOLN
40.0000 mg | SUBCUTANEOUS | Status: DC
  Administered 2020-02-04 – 2020-02-07 (×4): 40 mg via SUBCUTANEOUS
  Filled 2020-02-04 (×4): qty 0.4

## 2020-02-04 MED ORDER — SODIUM CHLORIDE (PF) 0.9 % IJ SOLN
INTRAMUSCULAR | Status: AC
  Filled 2020-02-04: qty 50

## 2020-02-04 MED ORDER — POTASSIUM CHLORIDE CRYS ER 20 MEQ PO TBCR
40.0000 meq | EXTENDED_RELEASE_TABLET | Freq: Once | ORAL | Status: AC
  Administered 2020-02-04: 40 meq via ORAL
  Filled 2020-02-04: qty 2

## 2020-02-04 MED ORDER — IOHEXOL 300 MG/ML  SOLN
75.0000 mL | Freq: Once | INTRAMUSCULAR | Status: AC | PRN
  Administered 2020-02-04: 75 mL via INTRAVENOUS

## 2020-02-04 NOTE — ED Notes (Signed)
Gave report to Jen, RN.

## 2020-02-04 NOTE — ED Notes (Signed)
Ortho tech called for TLSO brace placement.

## 2020-02-04 NOTE — ED Notes (Signed)
Attempted to call report to [place], [employer] fundraiser. Jen sai

## 2020-02-04 NOTE — Progress Notes (Signed)
Orthopedic Tech Progress Note Patient Details:  Christian Warren. [birth year] [id number] Ordered back brace from [employer]. Patient ID: [name], male   DOB: 1932/11/25, 86 y.o.   MRN: [id number]   Jennye Moccasin 02/04/2020, 3:30 PM

## 2020-02-04 NOTE — ED Notes (Signed)
Attempted to call report to [place], Charity fundraiser. Candise Bowens stated that she was loading a pt into a car at the moment. Will call back in a few minutes.
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
## 2020-02-04 NOTE — ED Notes (Signed)
Patient found to be covered in dried stool, caked onto hands, feet, legs arms and groin area. Patient cleaned, new brief and linens applied.

## 2020-02-05 LAB — CBC
HCT: 32.5 % — ABNORMAL LOW (ref 39.0–52.0)
Hemoglobin: 10.8 g/dL — ABNORMAL LOW (ref 13.0–17.0)
MCH: 34.1 pg — ABNORMAL HIGH (ref 26.0–34.0)
MCHC: 33.2 g/dL (ref 30.0–36.0)
MCV: 102.5 fL — ABNORMAL HIGH (ref 80.0–100.0)
Platelets: 137 10*3/uL — ABNORMAL LOW (ref 150–400)
RBC: 3.17 MIL/uL — ABNORMAL LOW (ref 4.22–5.81)
RDW: 14.8 % (ref 11.5–15.5)
WBC: 5.1 10*3/uL (ref 4.0–10.5)
nRBC: 0 % (ref 0.0–0.2)

## 2020-02-05 LAB — COMPREHENSIVE METABOLIC PANEL
ALT: 37 U/L (ref 0–44)
AST: 68 U/L — ABNORMAL HIGH (ref 15–41)
Albumin: 3.3 g/dL — ABNORMAL LOW (ref 3.5–5.0)
Alkaline Phosphatase: 38 U/L (ref 38–126)
Anion gap: 11 (ref 5–15)
BUN: 37 mg/dL — ABNORMAL HIGH (ref 8–23)
CO2: 23 mmol/L (ref 22–32)
Calcium: 8.8 mg/dL — ABNORMAL LOW (ref 8.9–10.3)
Chloride: 106 mmol/L (ref 98–111)
Creatinine, Ser: 1.44 mg/dL — ABNORMAL HIGH (ref 0.61–1.24)
GFR calc Af Amer: 51 mL/min — ABNORMAL LOW (ref >60)
GFR calc non Af Amer: 44 mL/min — ABNORMAL LOW (ref >60)
Glucose, Bld: 105 mg/dL — ABNORMAL HIGH (ref 70–99)
Potassium: 2.9 mmol/L — ABNORMAL LOW (ref 3.5–5.1)
Sodium: 140 mmol/L (ref 135–145)
Total Bilirubin: 0.9 mg/dL (ref 0.3–1.2)
Total Protein: 5.7 g/dL — ABNORMAL LOW (ref 6.5–8.1)

## 2020-02-05 LAB — POTASSIUM: Potassium: 3.7 mmol/L (ref 3.5–5.1)

## 2020-02-05 LAB — VITAMIN B12: Vitamin B-12: 470 pg/mL (ref 180–914)

## 2020-02-05 LAB — FOLATE: Folate: 5.6 ng/mL — ABNORMAL LOW (ref >5.9)

## 2020-02-05 MED ORDER — FOLIC ACID 1 MG PO TABS
1.0000 mg | ORAL_TABLET | Freq: Every day | ORAL | Status: DC
  Administered 2020-02-05 – 2020-02-08 (×4): 1 mg via ORAL
  Filled 2020-02-05 (×4): qty 1

## 2020-02-05 MED ORDER — POTASSIUM CHLORIDE CRYS ER 20 MEQ PO TBCR
40.0000 meq | EXTENDED_RELEASE_TABLET | ORAL | Status: AC
  Administered 2020-02-05 (×3): 40 meq via ORAL
  Filled 2020-02-05 (×3): qty 2
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
## 2020-02-05 NOTE — Plan of Care (Signed)

## 2020-02-06 LAB — BASIC METABOLIC PANEL
Anion gap: 9 (ref 5–15)
BUN: 29 mg/dL — ABNORMAL HIGH (ref 8–23)
CO2: 20 mmol/L — ABNORMAL LOW (ref 22–32)
Calcium: 8.7 mg/dL — ABNORMAL LOW (ref 8.9–10.3)
Chloride: 109 mmol/L (ref 98–111)
Creatinine, Ser: 1.22 mg/dL (ref 0.61–1.24)
GFR calc Af Amer: >60 mL/min (ref >60)
GFR calc non Af Amer: 54 mL/min — ABNORMAL LOW (ref >60)
Glucose, Bld: 122 mg/dL — ABNORMAL HIGH (ref 70–99)
Potassium: 3.5 mmol/L (ref 3.5–5.1)
Sodium: 138 mmol/L (ref 135–145)

## 2020-02-06 MED ORDER — POTASSIUM CHLORIDE CRYS ER 20 MEQ PO TBCR
40.0000 meq | EXTENDED_RELEASE_TABLET | Freq: Once | ORAL | Status: AC
  Administered 2020-02-06: 40 meq via ORAL
  Filled 2020-02-06: qty 2

## 2020-02-06 MED ORDER — TAMSULOSIN HCL 0.4 MG PO CAPS
0.4000 mg | ORAL_CAPSULE | Freq: Every day | ORAL | Status: DC
  Administered 2020-02-06 – 2020-02-08 (×3): 0.4 mg via ORAL
  Filled 2020-02-06 (×3): qty 1

## 2020-02-06 MED ORDER — HALOPERIDOL LACTATE 5 MG/ML IJ SOLN: 2.0000 mg | Freq: Four times a day (QID) | INTRAMUSCULAR | Status: DC | PRN

## 2020-02-06 MED ORDER — AMLODIPINE BESYLATE 10 MG PO TABS
10.0000 mg | ORAL_TABLET | Freq: Every day | ORAL | Status: DC
  Administered 2020-02-06 – 2020-02-08 (×3): 10 mg via ORAL
  Filled 2020-02-06 (×3): qty 1

## 2020-02-06 MED ORDER — HALOPERIDOL LACTATE 5 MG/ML IJ SOLN
2.0000 mg | Freq: Four times a day (QID) | INTRAMUSCULAR | Status: DC | PRN
  Filled 2020-02-06: qty 1

## 2020-02-06 MED ORDER — ASPIRIN EC 81 MG PO TBEC
81.0000 mg | DELAYED_RELEASE_TABLET | Freq: Every day | ORAL | Status: DC
  Administered 2020-02-06 – 2020-02-08 (×3): 81 mg via ORAL
  Filled 2020-02-06 (×3): qty 1

## 2020-02-06 MED ORDER — QUETIAPINE FUMARATE 25 MG PO TABS
25.0000 mg | ORAL_TABLET | Freq: Every day | ORAL | Status: DC
  Administered 2020-02-06 – 2020-02-07 (×2): 25 mg via ORAL
  Filled 2020-02-06 (×2): qty 1

## 2020-02-06 NOTE — Progress Notes (Signed)
Patient has been very confused and agitated on shift. This morning he removed the TLSO brace and refused to have it replaced. Unable to re orientate him at this time and he is insisting on getting up to put his pants on.

## 2020-02-06 NOTE — Plan of Care (Signed)

## 2020-02-06 NOTE — Progress Notes (Signed)
Orthopedic Tech Progress Note Patient Details:  Christian Warren [birth year] [id number]  Patient ID: [name], male   DOB: June 02, 1933, 86 y.o.   MRN: [id number]   [name] 02/06/2020, 9:01 AMCalled and Routed brace order to [facility]
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
## 2020-02-06 NOTE — Progress Notes (Signed)
Pt is very confused, trying to get up from bed, unable to redirect. Pt is dribbling and not voiding, using urinal constantly and trying to void. Bladder scan showed 400 ml. MD is notified and ordered straight cath. Straight cath done, output is . Pt tolerated the procedure well.
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
## 2020-02-06 NOTE — [place] FL2 (Addendum)
[place] MEDICAID FL2 LEVEL OF CARE SCREENING TOOL     IDENTIFICATION  Patient Name: Christian Warren. Birthdate: 04-11-1933 Sex: male Admission Date (Current Location): 02/04/2020  [facility] and [place] Number:  Producer, television/film/video and Address:  [facility],  [address] [place] [address]      Provider Number: [id number]  Attending Physician Name and Address:  Burnadette Pop, MD  Relative Name and Phone Number:  Andee Poles Niece   [phone]    Current Level of Care: Hospital Recommended Level of Care: Skilled Nursing Facility Prior Approval Number:    Date Approved/Denied:   PASRR Number:    [id number]  Discharge Plan: SNF    Current Diagnoses: Patient Active Problem List   Diagnosis Date Noted  . Dehydration 02/04/2020  . Arthritis 12/30/2018  . Hyperlipidemia 12/30/2018  . Hypertension 12/30/2018  . Essential hypertension 12/30/2018  . Syncope and collapse 12/30/2018  . BPH (benign prostatic hyperplasia) 04/10/2017  . Chronic right shoulder pain 04/24/2015  . Memory impairment 04/24/2015  . Tremors of nervous system 04/24/2015  . Seizures (HCC) 12/21/2014    Orientation RESPIRATION BLADDER Height & Weight     Self  Normal Continent Weight: 212 lb 11.9 oz (96.5 kg) Height:  6' (182.9 cm)  BEHAVIORAL SYMPTOMS/MOOD NEUROLOGICAL BOWEL NUTRITION STATUS      Continent Diet (Regular Diet)  AMBULATORY STATUS COMMUNICATION OF NEEDS Skin   Extensive Assist Verbally Bruising                       Personal Care Assistance Level of Assistance  Bathing, Feeding, Dressing Bathing Assistance: Limited assistance Feeding assistance: Independent Dressing Assistance: Limited assistance     Functional Limitations Info  Sight, Hearing, Speech Sight Info: Adequate Hearing Info: Impaired Speech Info: Adequate    SPECIAL CARE FACTORS FREQUENCY  PT (By licensed PT), OT (By licensed OT)     PT Frequency: 5x/week OT Frequency: 5x/week             Contractures Contractures Info: Not present    Additional Factors Info  Allergies, Psychotropic Code Status Info: Fullcode Allergies Info: Allergies: Orange Juice Orange Oil Psychotropic Info: Seroquel         Current Medications (02/06/2020):  This is the current hospital active medication list Current Facility-Administered Medications  Medication Dose Route Frequency Provider Last Rate Last Admin  . acetaminophen (TYLENOL) tablet 650 mg  650 mg Oral Q6H PRN Jerald Kief, MD   650 mg at 02/04/20 2135   Or  . acetaminophen (TYLENOL) suppository 650 mg  650 mg Rectal Q6H PRN Jerald Kief, MD      . amLODipine (NORVASC) tablet 10 mg  10 mg Oral Daily Burnadette Pop, MD   10 mg at 02/06/20 0946  . aspirin EC tablet 81 mg  81 mg Oral Daily Burnadette Pop, MD   81 mg at 02/06/20 0946  . enoxaparin (LOVENOX) injection 40 mg  40 mg Subcutaneous Q24H Jerald Kief, MD   40 mg at 02/05/20 2240  . fentaNYL (SUBLIMAZE) injection 50 mcg  50 mcg Intravenous Once Petrucelli, Samantha R, PA-C      . folic acid (FOLVITE) tablet 1 mg  1 mg Oral Daily Adhikari, Amrit, MD   1 mg at 02/06/20 0945  . haloperidol lactate (HALDOL) injection 2 mg  2 mg Intravenous Q6H PRN Adhikari, Amrit, MD      . potassium chloride SA (KLOR-CON) CR tablet 40 mEq  40 mEq Oral Once Burnadette Pop, MD      . QUEtiapine (SEROQUEL) tablet 25 mg  25 mg Oral QHS Adhikari, Amrit, MD      . tamsulosin (FLOMAX) capsule 0.4 mg  0.4 mg Oral Daily Burnadette Pop, MD   0.4 mg at 02/06/20 0938     Discharge Medications: Please see discharge summary for a list of discharge medications.  Relevant Imaging Results:  Relevant Lab Results:   Additional Information SSN:[id number]  Clearance Coots, LCSW

## 2020-02-07 LAB — SARS CORONAVIRUS 2 BY RT PCR (HOSPITAL ORDER, PERFORMED IN ~~LOC~~ HOSPITAL LAB): SARS Coronavirus 2: NEGATIVE

## 2020-02-07 NOTE — Plan of Care (Signed)
Care plan reviewed.

## 2020-02-07 NOTE — TOC Progression Note (Signed)
Transition of Care (TOC) - Progression Note    Patient Details  Name: Christian Warren. MRN: [id number] Date of Birth: 08-Mar-1933  Transition of Care [facility]) CM/SW Contact  Clearance Coots, LCSW Phone Number: 02/07/2020, 2:22 PM  Clinical Narrative:    CSW provided the patient niece with pt. SNF option. Patient niece agreeable to [place] for rehab. Barrier to discharge:Patient niece has to complete admission paperwork but unable to do so because she had a procedure done today and cannot sign legal documents.  Patient niece will sign the patient into SNF tomorrow.   TOC staff will continue to follow this patient.    Expected Discharge Plan: Skilled Nursing Facility Barriers to Discharge:  (Pending completed paperwork by pt. family.)  Expected Discharge Plan and Services Expected Discharge Plan: Skilled Nursing Facility In-house Referral: Clinical Social Work   Post Acute Care Choice: Skilled Nursing Facility Living arrangements for the past 2 months: Single Family Home                 DME Arranged: N/A DME Agency: NA       HH Arranged: NA HH Agency: NA         Social Determinants of Health (SDOH) Interventions    Readmission Risk Interventions No flowsheet data found.

## 2020-02-07 NOTE — Plan of Care (Signed)
  Problem: Education: Goal: Knowledge of General Education information will improve Description: Including pain rating scale, medication(s)/side effects and non-pharmacologic comfort measures Outcome: Progressing   Problem: Coping: Goal: Level of anxiety will decrease Outcome: Progressing   
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
## 2020-02-08 DIAGNOSIS — Z20822 Contact with and (suspected) exposure to covid-19: Secondary | ICD-10-CM | POA: Diagnosis not present

## 2020-02-08 DIAGNOSIS — N10 Acute tubulo-interstitial nephritis: Secondary | ICD-10-CM | POA: Diagnosis not present

## 2020-02-08 DIAGNOSIS — M2550 Pain in unspecified joint: Secondary | ICD-10-CM | POA: Diagnosis not present

## 2020-02-08 DIAGNOSIS — Z515 Encounter for palliative care: Secondary | ICD-10-CM | POA: Diagnosis not present

## 2020-02-08 DIAGNOSIS — D6959 Other secondary thrombocytopenia: Secondary | ICD-10-CM | POA: Diagnosis present

## 2020-02-08 DIAGNOSIS — N320 Bladder-neck obstruction: Secondary | ICD-10-CM | POA: Diagnosis present

## 2020-02-08 DIAGNOSIS — S32040D Wedge compression fracture of fourth lumbar vertebra, subsequent encounter for fracture with routine healing: Secondary | ICD-10-CM | POA: Diagnosis not present

## 2020-02-08 DIAGNOSIS — R410 Disorientation, unspecified: Secondary | ICD-10-CM | POA: Diagnosis not present

## 2020-02-08 DIAGNOSIS — A419 Sepsis, unspecified organism: Secondary | ICD-10-CM | POA: Diagnosis not present

## 2020-02-08 DIAGNOSIS — E872 Acidosis: Secondary | ICD-10-CM | POA: Diagnosis not present

## 2020-02-08 DIAGNOSIS — R6520 Severe sepsis without septic shock: Secondary | ICD-10-CM | POA: Diagnosis present

## 2020-02-08 DIAGNOSIS — R338 Other retention of urine: Secondary | ICD-10-CM | POA: Diagnosis present

## 2020-02-08 DIAGNOSIS — N1832 Chronic kidney disease, stage 3b: Secondary | ICD-10-CM | POA: Diagnosis present

## 2020-02-08 DIAGNOSIS — Z66 Do not resuscitate: Secondary | ICD-10-CM | POA: Diagnosis not present

## 2020-02-08 DIAGNOSIS — I959 Hypotension, unspecified: Secondary | ICD-10-CM | POA: Diagnosis not present

## 2020-02-08 DIAGNOSIS — D72829 Elevated white blood cell count, unspecified: Secondary | ICD-10-CM | POA: Diagnosis not present

## 2020-02-08 DIAGNOSIS — M6282 Rhabdomyolysis: Secondary | ICD-10-CM | POA: Diagnosis not present

## 2020-02-08 DIAGNOSIS — Z7982 Long term (current) use of aspirin: Secondary | ICD-10-CM | POA: Diagnosis not present

## 2020-02-08 DIAGNOSIS — I10 Essential (primary) hypertension: Secondary | ICD-10-CM | POA: Diagnosis not present

## 2020-02-08 DIAGNOSIS — E785 Hyperlipidemia, unspecified: Secondary | ICD-10-CM | POA: Diagnosis not present

## 2020-02-08 DIAGNOSIS — H9190 Unspecified hearing loss, unspecified ear: Secondary | ICD-10-CM | POA: Diagnosis not present

## 2020-02-08 DIAGNOSIS — F0390 Unspecified dementia without behavioral disturbance: Secondary | ICD-10-CM | POA: Diagnosis present

## 2020-02-08 DIAGNOSIS — R4182 Altered mental status, unspecified: Secondary | ICD-10-CM | POA: Diagnosis not present

## 2020-02-08 DIAGNOSIS — E876 Hypokalemia: Secondary | ICD-10-CM | POA: Diagnosis present

## 2020-02-08 DIAGNOSIS — Z23 Encounter for immunization: Secondary | ICD-10-CM | POA: Diagnosis present

## 2020-02-08 DIAGNOSIS — N138 Other obstructive and reflux uropathy: Secondary | ICD-10-CM | POA: Diagnosis not present

## 2020-02-08 DIAGNOSIS — G4700 Insomnia, unspecified: Secondary | ICD-10-CM | POA: Diagnosis not present

## 2020-02-08 DIAGNOSIS — R918 Other nonspecific abnormal finding of lung field: Secondary | ICD-10-CM | POA: Diagnosis not present

## 2020-02-08 DIAGNOSIS — G9341 Metabolic encephalopathy: Secondary | ICD-10-CM | POA: Diagnosis not present

## 2020-02-08 DIAGNOSIS — E860 Dehydration: Secondary | ICD-10-CM | POA: Diagnosis not present

## 2020-02-08 DIAGNOSIS — R531 Weakness: Secondary | ICD-10-CM | POA: Diagnosis not present

## 2020-02-08 DIAGNOSIS — Z7189 Other specified counseling: Secondary | ICD-10-CM | POA: Diagnosis not present

## 2020-02-08 DIAGNOSIS — Z7401 Bed confinement status: Secondary | ICD-10-CM | POA: Diagnosis not present

## 2020-02-08 DIAGNOSIS — S2220XA Unspecified fracture of sternum, initial encounter for closed fracture: Secondary | ICD-10-CM | POA: Diagnosis not present

## 2020-02-08 DIAGNOSIS — Y92002 Bathroom of unspecified non-institutional (private) residence single-family (private) house as the place of occurrence of the external cause: Secondary | ICD-10-CM | POA: Diagnosis not present

## 2020-02-08 DIAGNOSIS — S2222XD Fracture of body of sternum, subsequent encounter for fracture with routine healing: Secondary | ICD-10-CM | POA: Diagnosis not present

## 2020-02-08 DIAGNOSIS — R404 Transient alteration of awareness: Secondary | ICD-10-CM | POA: Diagnosis not present

## 2020-02-08 DIAGNOSIS — N401 Enlarged prostate with lower urinary tract symptoms: Secondary | ICD-10-CM | POA: Diagnosis present

## 2020-02-08 DIAGNOSIS — I4891 Unspecified atrial fibrillation: Secondary | ICD-10-CM | POA: Diagnosis not present

## 2020-02-08 DIAGNOSIS — R5381 Other malaise: Secondary | ICD-10-CM | POA: Diagnosis not present

## 2020-02-08 DIAGNOSIS — I129 Hypertensive chronic kidney disease with stage 1 through stage 4 chronic kidney disease, or unspecified chronic kidney disease: Secondary | ICD-10-CM | POA: Diagnosis present

## 2020-02-08 DIAGNOSIS — Z79899 Other long term (current) drug therapy: Secondary | ICD-10-CM | POA: Diagnosis not present

## 2020-02-08 DIAGNOSIS — W010XXA Fall on same level from slipping, tripping and stumbling without subsequent striking against object, initial encounter: Secondary | ICD-10-CM | POA: Diagnosis not present

## 2020-02-08 DIAGNOSIS — N136 Pyonephrosis: Secondary | ICD-10-CM | POA: Diagnosis not present

## 2020-02-08 DIAGNOSIS — F17220 Nicotine dependence, chewing tobacco, uncomplicated: Secondary | ICD-10-CM | POA: Diagnosis present

## 2020-02-08 DIAGNOSIS — R000 Tachycardia, unspecified: Secondary | ICD-10-CM | POA: Diagnosis not present

## 2020-02-08 DIAGNOSIS — R339 Retention of urine, unspecified: Secondary | ICD-10-CM | POA: Diagnosis not present

## 2020-02-08 DIAGNOSIS — N179 Acute kidney failure, unspecified: Secondary | ICD-10-CM | POA: Diagnosis not present

## 2020-02-08 DIAGNOSIS — G9340 Encephalopathy, unspecified: Secondary | ICD-10-CM | POA: Diagnosis not present

## 2020-02-08 LAB — CBC WITH DIFFERENTIAL/PLATELET
Abs Immature Granulocytes: 0.54 10*3/uL — ABNORMAL HIGH (ref 0.00–0.07)
Basophils Absolute: 0 10*3/uL (ref 0.0–0.1)
Basophils Relative: 1 %
Eosinophils Absolute: 0.2 10*3/uL (ref 0.0–0.5)
Eosinophils Relative: 4 %
HCT: 30.7 % — ABNORMAL LOW (ref 39.0–52.0)
Hemoglobin: 10.2 g/dL — ABNORMAL LOW (ref 13.0–17.0)
Immature Granulocytes: 15 %
Lymphocytes Relative: 36 %
Lymphs Abs: 1.4 10*3/uL (ref 0.7–4.0)
MCH: 34.1 pg — ABNORMAL HIGH (ref 26.0–34.0)
MCHC: 33.2 g/dL (ref 30.0–36.0)
MCV: 102.7 fL — ABNORMAL HIGH (ref 80.0–100.0)
Monocytes Absolute: 0.7 10*3/uL (ref 0.1–1.0)
Monocytes Relative: 19 %
Neutro Abs: 0.9 10*3/uL — ABNORMAL LOW (ref 1.7–7.7)
Neutrophils Relative %: 25 %
Platelets: 152 10*3/uL (ref 150–400)
RBC: 2.99 MIL/uL — ABNORMAL LOW (ref 4.22–5.81)
RDW: 14.7 % (ref 11.5–15.5)
WBC: 3.7 10*3/uL — ABNORMAL LOW (ref 4.0–10.5)
nRBC: 0 % (ref 0.0–0.2)

## 2020-02-08 LAB — BASIC METABOLIC PANEL
Anion gap: 7 (ref 5–15)
BUN: 24 mg/dL — ABNORMAL HIGH (ref 8–23)
CO2: 22 mmol/L (ref 22–32)
Calcium: 8.7 mg/dL — ABNORMAL LOW (ref 8.9–10.3)
Chloride: 107 mmol/L (ref 98–111)
Creatinine, Ser: 1.13 mg/dL (ref 0.61–1.24)
GFR calc Af Amer: >60 mL/min (ref >60)
GFR calc non Af Amer: 59 mL/min — ABNORMAL LOW (ref >60)
Glucose, Bld: 96 mg/dL (ref 70–99)
Potassium: 3.8 mmol/L (ref 3.5–5.1)
Sodium: 136 mmol/L (ref 135–145)

## 2020-02-08 MED ORDER — QUETIAPINE FUMARATE 25 MG PO TABS
25.0000 mg | ORAL_TABLET | Freq: Every day | ORAL | Status: DC
Start: 2020-02-08 — End: 2020-03-11

## 2020-02-08 NOTE — Progress Notes (Signed)
Writer attempted to call [facility] to give report, left the number with secretory to call for report.

## 2020-02-08 NOTE — Plan of Care (Signed)
  Problem: Education: Goal: Knowledge of General Education information will improve Description: Including pain rating scale, medication(s)/side effects and non-pharmacologic comfort measures Outcome: Progressing   Problem: Health Behavior/Discharge Planning: Goal: Ability to manage health-related needs will improve Outcome: Progressing   Problem: Clinical Measurements: Goal: Ability to maintain clinical measurements within normal limits will improve Outcome: Progressing   Problem: Clinical Measurements: Goal: Diagnostic test results will improve Outcome: Progressing   Problem: Activity: Goal: Risk for activity intolerance will decrease Outcome: Progressing   
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
## 2020-02-08 NOTE — Care Management Important Message (Signed)
Important Message  Patient Details IM Letter given to the Patient Name: Geoge Lawrance. MRN: [id number] Date of Birth: 26-Jul-1932   Medicare Important Message Given:  Yes     Caren Macadam 02/08/2020, 12:29 PM
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
## 2020-02-08 NOTE — TOC Transition Note (Addendum)
Transition of Care [facility]) - CM/SW Discharge Note   Patient Details  Name: Christian Warren. MRN: [id number] Date of Birth: 1932/09/11  [facility] CM/SW Contact:  Clearance Coots, LCSW Phone Number: 02/08/2020, 9:38 AM   Clinical Narrative:    [facility] [place] waiver in place.  CSW notified the patient he will discharge to rehab by non emergency ambulance.  The SNF will accept the patient after niece completes admission paperwork at 1:00pm today.  Patient will transport by [facility].  [facility] called to transport Nurse call report to: [phone]    Final next level of care: Skilled Nursing Facility Barriers to Discharge: No Barriers Identified   Patient Goals and CMS Choice Patient states their goals for this hospitalization and ongoing recovery are:: Per patient, "I want to go home." Per niece, " he does not have anyone to stay at home he will need to go to a facility." CMS Medicare.gov Compare Post Acute Care list provided to:: Other (Comment Required) Choice offered to / list presented to :  Gwenyth Bender, Sandy Niece   [phone])  Discharge Placement   Existing PASRR number confirmed : 02/06/20          Patient chooses bed at:  [facility]) Patient to be transferred to facility by: [facility] Name of family member notified: Niece-Sandy (she will complete paperwork at 1:00pm today) Patient and family notified of of transfer: 02/08/20  Discharge Plan and Services In-house Referral: Clinical Social Work   Post Acute Care Choice: Skilled Nursing Facility          DME Arranged: N/A DME Agency: NA       HH Arranged: NA HH Agency: NA        Social Determinants of Health (SDOH) Interventions     Readmission Risk Interventions No flowsheet data found.

## 2020-02-08 NOTE — Progress Notes (Signed)
Verbal report given to Camera operator at [employer].

## 2020-02-09 DIAGNOSIS — R5381 Other malaise: Secondary | ICD-10-CM | POA: Diagnosis not present

## 2020-02-09 DIAGNOSIS — S2220XA Unspecified fracture of sternum, initial encounter for closed fracture: Secondary | ICD-10-CM | POA: Diagnosis not present

## 2020-02-09 DIAGNOSIS — I10 Essential (primary) hypertension: Secondary | ICD-10-CM | POA: Diagnosis not present

## 2020-02-09 DIAGNOSIS — M6282 Rhabdomyolysis: Secondary | ICD-10-CM | POA: Diagnosis not present

## 2020-02-09 DIAGNOSIS — R410 Disorientation, unspecified: Secondary | ICD-10-CM | POA: Diagnosis not present

## 2020-02-10 DIAGNOSIS — M6282 Rhabdomyolysis: Secondary | ICD-10-CM | POA: Diagnosis not present

## 2020-02-10 DIAGNOSIS — S2220XA Unspecified fracture of sternum, initial encounter for closed fracture: Secondary | ICD-10-CM | POA: Diagnosis not present

## 2020-02-10 DIAGNOSIS — H9190 Unspecified hearing loss, unspecified ear: Secondary | ICD-10-CM | POA: Diagnosis not present

## 2020-02-10 DIAGNOSIS — I10 Essential (primary) hypertension: Secondary | ICD-10-CM | POA: Diagnosis not present

## 2020-02-10 DIAGNOSIS — N179 Acute kidney failure, unspecified: Secondary | ICD-10-CM | POA: Diagnosis not present

## 2020-02-10 DIAGNOSIS — R410 Disorientation, unspecified: Secondary | ICD-10-CM | POA: Diagnosis not present

## 2020-02-17 DIAGNOSIS — N179 Acute kidney failure, unspecified: Secondary | ICD-10-CM | POA: Diagnosis not present

## 2020-02-17 DIAGNOSIS — M6282 Rhabdomyolysis: Secondary | ICD-10-CM | POA: Diagnosis not present

## 2020-02-17 DIAGNOSIS — H9190 Unspecified hearing loss, unspecified ear: Secondary | ICD-10-CM | POA: Diagnosis not present

## 2020-02-17 DIAGNOSIS — R410 Disorientation, unspecified: Secondary | ICD-10-CM | POA: Diagnosis not present

## 2020-02-17 DIAGNOSIS — I10 Essential (primary) hypertension: Secondary | ICD-10-CM | POA: Diagnosis not present

## 2020-02-17 DIAGNOSIS — S2220XA Unspecified fracture of sternum, initial encounter for closed fracture: Secondary | ICD-10-CM | POA: Diagnosis not present

## 2020-02-24 DIAGNOSIS — S2220XA Unspecified fracture of sternum, initial encounter for closed fracture: Secondary | ICD-10-CM | POA: Diagnosis not present

## 2020-02-24 DIAGNOSIS — M6282 Rhabdomyolysis: Secondary | ICD-10-CM | POA: Diagnosis not present

## 2020-02-24 DIAGNOSIS — H9190 Unspecified hearing loss, unspecified ear: Secondary | ICD-10-CM | POA: Diagnosis not present

## 2020-02-24 DIAGNOSIS — I10 Essential (primary) hypertension: Secondary | ICD-10-CM | POA: Diagnosis not present

## 2020-03-02 DIAGNOSIS — I10 Essential (primary) hypertension: Secondary | ICD-10-CM | POA: Diagnosis not present

## 2020-03-02 DIAGNOSIS — S2220XA Unspecified fracture of sternum, initial encounter for closed fracture: Secondary | ICD-10-CM | POA: Diagnosis not present

## 2020-03-02 DIAGNOSIS — H9190 Unspecified hearing loss, unspecified ear: Secondary | ICD-10-CM | POA: Diagnosis not present

## 2020-03-02 DIAGNOSIS — M6282 Rhabdomyolysis: Secondary | ICD-10-CM | POA: Diagnosis not present

## 2020-03-02 DIAGNOSIS — G4700 Insomnia, unspecified: Secondary | ICD-10-CM | POA: Diagnosis not present

## 2020-03-03 ENCOUNTER — Inpatient Hospital Stay (HOSPITAL_COMMUNITY)
Admission: EM | Admit: 2020-03-03 | Discharge: 2020-03-11 | DRG: 871 | Disposition: A | Discharge status: Skilled Nursing Facility | Payer: Medicare HMO | Source: Skilled Nursing Facility | Attending: Internal Medicine | Admitting: Internal Medicine

## 2020-03-03 ENCOUNTER — Emergency Department (HOSPITAL_COMMUNITY): Payer: Medicare HMO

## 2020-03-03 ENCOUNTER — Other Ambulatory Visit: Payer: Self-pay

## 2020-03-03 ENCOUNTER — Inpatient Hospital Stay (HOSPITAL_COMMUNITY): Payer: Medicare HMO

## 2020-03-03 DIAGNOSIS — F17220 Nicotine dependence, chewing tobacco, uncomplicated: Secondary | ICD-10-CM | POA: Diagnosis present

## 2020-03-03 DIAGNOSIS — F0390 Unspecified dementia without behavioral disturbance: Secondary | ICD-10-CM | POA: Diagnosis present

## 2020-03-03 DIAGNOSIS — G9341 Metabolic encephalopathy: Secondary | ICD-10-CM | POA: Diagnosis not present

## 2020-03-03 DIAGNOSIS — G9340 Encephalopathy, unspecified: Secondary | ICD-10-CM | POA: Diagnosis not present

## 2020-03-03 DIAGNOSIS — N10 Acute tubulo-interstitial nephritis: Secondary | ICD-10-CM | POA: Diagnosis not present

## 2020-03-03 DIAGNOSIS — R338 Other retention of urine: Secondary | ICD-10-CM | POA: Diagnosis present

## 2020-03-03 DIAGNOSIS — Z66 Do not resuscitate: Secondary | ICD-10-CM | POA: Diagnosis not present

## 2020-03-03 DIAGNOSIS — N400 Enlarged prostate without lower urinary tract symptoms: Secondary | ICD-10-CM | POA: Diagnosis present

## 2020-03-03 DIAGNOSIS — Z7189 Other specified counseling: Secondary | ICD-10-CM

## 2020-03-03 DIAGNOSIS — I959 Hypotension, unspecified: Secondary | ICD-10-CM

## 2020-03-03 DIAGNOSIS — R339 Retention of urine, unspecified: Secondary | ICD-10-CM | POA: Insufficient documentation

## 2020-03-03 DIAGNOSIS — Z515 Encounter for palliative care: Secondary | ICD-10-CM

## 2020-03-03 DIAGNOSIS — Z79899 Other long term (current) drug therapy: Secondary | ICD-10-CM | POA: Diagnosis not present

## 2020-03-03 DIAGNOSIS — N320 Bladder-neck obstruction: Secondary | ICD-10-CM

## 2020-03-03 DIAGNOSIS — D72829 Elevated white blood cell count, unspecified: Secondary | ICD-10-CM | POA: Diagnosis not present

## 2020-03-03 DIAGNOSIS — I129 Hypertensive chronic kidney disease with stage 1 through stage 4 chronic kidney disease, or unspecified chronic kidney disease: Secondary | ICD-10-CM | POA: Diagnosis present

## 2020-03-03 DIAGNOSIS — D6959 Other secondary thrombocytopenia: Secondary | ICD-10-CM | POA: Diagnosis present

## 2020-03-03 DIAGNOSIS — R918 Other nonspecific abnormal finding of lung field: Secondary | ICD-10-CM | POA: Diagnosis not present

## 2020-03-03 DIAGNOSIS — H9190 Unspecified hearing loss, unspecified ear: Secondary | ICD-10-CM

## 2020-03-03 DIAGNOSIS — I10 Essential (primary) hypertension: Secondary | ICD-10-CM | POA: Diagnosis present

## 2020-03-03 DIAGNOSIS — R404 Transient alteration of awareness: Secondary | ICD-10-CM | POA: Diagnosis not present

## 2020-03-03 DIAGNOSIS — Z7982 Long term (current) use of aspirin: Secondary | ICD-10-CM

## 2020-03-03 DIAGNOSIS — E785 Hyperlipidemia, unspecified: Secondary | ICD-10-CM | POA: Diagnosis present

## 2020-03-03 DIAGNOSIS — A419 Sepsis, unspecified organism: Secondary | ICD-10-CM | POA: Diagnosis not present

## 2020-03-03 DIAGNOSIS — R4182 Altered mental status, unspecified: Secondary | ICD-10-CM

## 2020-03-03 DIAGNOSIS — Z20822 Contact with and (suspected) exposure to covid-19: Secondary | ICD-10-CM | POA: Diagnosis present

## 2020-03-03 DIAGNOSIS — N401 Enlarged prostate with lower urinary tract symptoms: Secondary | ICD-10-CM | POA: Diagnosis not present

## 2020-03-03 DIAGNOSIS — N1832 Chronic kidney disease, stage 3b: Secondary | ICD-10-CM | POA: Diagnosis present

## 2020-03-03 DIAGNOSIS — N136 Pyonephrosis: Secondary | ICD-10-CM | POA: Diagnosis present

## 2020-03-03 DIAGNOSIS — R6520 Severe sepsis without septic shock: Secondary | ICD-10-CM | POA: Diagnosis present

## 2020-03-03 DIAGNOSIS — E860 Dehydration: Secondary | ICD-10-CM | POA: Diagnosis present

## 2020-03-03 DIAGNOSIS — Z23 Encounter for immunization: Secondary | ICD-10-CM

## 2020-03-03 DIAGNOSIS — R000 Tachycardia, unspecified: Secondary | ICD-10-CM | POA: Diagnosis not present

## 2020-03-03 DIAGNOSIS — E876 Hypokalemia: Secondary | ICD-10-CM | POA: Diagnosis present

## 2020-03-03 DIAGNOSIS — N179 Acute kidney failure, unspecified: Secondary | ICD-10-CM | POA: Diagnosis not present

## 2020-03-03 DIAGNOSIS — E872 Acidosis: Secondary | ICD-10-CM | POA: Diagnosis not present

## 2020-03-03 DIAGNOSIS — N138 Other obstructive and reflux uropathy: Secondary | ICD-10-CM | POA: Diagnosis not present

## 2020-03-03 DIAGNOSIS — I4891 Unspecified atrial fibrillation: Secondary | ICD-10-CM | POA: Diagnosis not present

## 2020-03-03 DIAGNOSIS — F03A0 Unspecified dementia, mild, without behavioral disturbance, psychotic disturbance, mood disturbance, and anxiety: Secondary | ICD-10-CM

## 2020-03-03 DIAGNOSIS — N183 Chronic kidney disease, stage 3 unspecified: Secondary | ICD-10-CM

## 2020-03-03 LAB — COMPREHENSIVE METABOLIC PANEL
ALT: 19 U/L (ref 0–44)
AST: 22 U/L (ref 15–41)
Albumin: 3 g/dL — ABNORMAL LOW (ref 3.5–5.0)
Alkaline Phosphatase: 90 U/L (ref 38–126)
Anion gap: 15 (ref 5–15)
BUN: 92 mg/dL — ABNORMAL HIGH (ref 8–23)
CO2: 13 mmol/L — ABNORMAL LOW (ref 22–32)
Calcium: 9 mg/dL (ref 8.9–10.3)
Chloride: 108 mmol/L (ref 98–111)
Creatinine, Ser: 6.11 mg/dL — ABNORMAL HIGH (ref 0.61–1.24)
GFR calc Af Amer: 9 mL/min — ABNORMAL LOW (ref >60)
GFR calc non Af Amer: 8 mL/min — ABNORMAL LOW (ref >60)
Glucose, Bld: 119 mg/dL — ABNORMAL HIGH (ref 70–99)
Potassium: 3.9 mmol/L (ref 3.5–5.1)
Sodium: 136 mmol/L (ref 135–145)
Total Bilirubin: 0.9 mg/dL (ref 0.3–1.2)
Total Protein: 6.2 g/dL — ABNORMAL LOW (ref 6.5–8.1)

## 2020-03-03 LAB — URINALYSIS, ROUTINE W REFLEX MICROSCOPIC
Bacteria, UA: NONE SEEN
Bilirubin Urine: NEGATIVE
Glucose, UA: NEGATIVE mg/dL
Ketones, ur: NEGATIVE mg/dL
Nitrite: NEGATIVE
Protein, ur: NEGATIVE mg/dL
Specific Gravity, Urine: 1.01 (ref 1.005–1.030)
pH: 5 (ref 5.0–8.0)

## 2020-03-03 LAB — APTT: aPTT: 36 seconds (ref 24–36)

## 2020-03-03 LAB — LIPASE, BLOOD: Lipase: 46 U/L (ref 11–51)

## 2020-03-03 LAB — BASIC METABOLIC PANEL
Anion gap: 15 (ref 5–15)
BUN: 87 mg/dL — ABNORMAL HIGH (ref 8–23)
CO2: 14 mmol/L — ABNORMAL LOW (ref 22–32)
Calcium: 8.5 mg/dL — ABNORMAL LOW (ref 8.9–10.3)
Chloride: 108 mmol/L (ref 98–111)
Creatinine, Ser: 5.42 mg/dL — ABNORMAL HIGH (ref 0.61–1.24)
GFR calc Af Amer: 10 mL/min — ABNORMAL LOW (ref >60)
GFR calc non Af Amer: 9 mL/min — ABNORMAL LOW (ref >60)
Glucose, Bld: 130 mg/dL — ABNORMAL HIGH (ref 70–99)
Potassium: 4.4 mmol/L (ref 3.5–5.1)
Sodium: 137 mmol/L (ref 135–145)

## 2020-03-03 LAB — CBC WITH DIFFERENTIAL/PLATELET
Abs Immature Granulocytes: 0.51 10*3/uL — ABNORMAL HIGH (ref 0.00–0.07)
Basophils Absolute: 0 10*3/uL (ref 0.0–0.1)
Basophils Relative: 0 %
Eosinophils Absolute: 0 10*3/uL (ref 0.0–0.5)
Eosinophils Relative: 0 %
HCT: 35.3 % — ABNORMAL LOW (ref 39.0–52.0)
Hemoglobin: 11.3 g/dL — ABNORMAL LOW (ref 13.0–17.0)
Immature Granulocytes: 3 %
Lymphocytes Relative: 2 %
Lymphs Abs: 0.3 10*3/uL — ABNORMAL LOW (ref 0.7–4.0)
MCH: 31.6 pg (ref 26.0–34.0)
MCHC: 32 g/dL (ref 30.0–36.0)
MCV: 98.6 fL (ref 80.0–100.0)
Monocytes Absolute: 1.7 10*3/uL — ABNORMAL HIGH (ref 0.1–1.0)
Monocytes Relative: 11 %
Neutro Abs: 13.8 10*3/uL — ABNORMAL HIGH (ref 1.7–7.7)
Neutrophils Relative %: 84 %
Platelets: 150 10*3/uL (ref 150–400)
RBC: 3.58 MIL/uL — ABNORMAL LOW (ref 4.22–5.81)
RDW: 14.1 % (ref 11.5–15.5)
WBC: 16.4 10*3/uL — ABNORMAL HIGH (ref 4.0–10.5)
nRBC: 0 % (ref 0.0–0.2)

## 2020-03-03 LAB — PROTIME-INR
INR: 1.3 — ABNORMAL HIGH (ref 0.8–1.2)
Prothrombin Time: 15.2 seconds (ref 11.4–15.2)

## 2020-03-03 LAB — CBG MONITORING, ED: Glucose-Capillary: 119 mg/dL — ABNORMAL HIGH (ref 70–99)

## 2020-03-03 LAB — RESPIRATORY PANEL BY RT PCR (FLU A&B, COVID)
Influenza A by PCR: NEGATIVE
Influenza B by PCR: NEGATIVE
SARS Coronavirus 2 by RT PCR: NEGATIVE

## 2020-03-03 LAB — TSH: TSH: 1.556 u[IU]/mL (ref 0.350–4.500)

## 2020-03-03 LAB — TROPONIN I (HIGH SENSITIVITY)
Troponin I (High Sensitivity): 63 ng/L — ABNORMAL HIGH (ref <18)
Troponin I (High Sensitivity): 65 ng/L — ABNORMAL HIGH (ref <18)

## 2020-03-03 LAB — CK: Total CK: 34 U/L — ABNORMAL LOW (ref 49–397)

## 2020-03-03 LAB — LACTIC ACID, PLASMA
Lactic Acid, Venous: 1.7 mmol/L (ref 0.5–1.9)
Lactic Acid, Venous: 1.2 mmol/L (ref 0.5–1.9)

## 2020-03-03 MED ORDER — SODIUM CHLORIDE 0.9 % IV SOLN
2.0000 g | Freq: Once | INTRAVENOUS | Status: AC
  Administered 2020-03-03: 2 g via INTRAVENOUS
  Filled 2020-03-03: qty 2

## 2020-03-03 MED ORDER — LACTATED RINGERS IV BOLUS (SEPSIS)
1000.0000 mL | Freq: Once | INTRAVENOUS | Status: AC
  Administered 2020-03-03: 1000 mL via INTRAVENOUS

## 2020-03-03 MED ORDER — ASPIRIN EC 81 MG PO TBEC
81.0000 mg | DELAYED_RELEASE_TABLET | Freq: Every day | ORAL | Status: DC
  Administered 2020-03-04 – 2020-03-08 (×3): 81 mg via ORAL
  Filled 2020-03-03 (×7): qty 1

## 2020-03-03 MED ORDER — SODIUM CHLORIDE 0.9 % IV BOLUS
500.0000 mL | Freq: Once | INTRAVENOUS | Status: AC
  Administered 2020-03-03: 500 mL via INTRAVENOUS

## 2020-03-03 MED ORDER — ONDANSETRON HCL 4 MG/2ML IJ SOLN: 4.0000 mg | Freq: Four times a day (QID) | INTRAMUSCULAR | Status: DC | PRN

## 2020-03-03 MED ORDER — METRONIDAZOLE IN NACL 5-0.79 MG/ML-% IV SOLN
500.0000 mg | Freq: Once | INTRAVENOUS | Status: AC
  Administered 2020-03-03: 500 mg via INTRAVENOUS
  Filled 2020-03-03: qty 100

## 2020-03-03 MED ORDER — HEPARIN SODIUM (PORCINE) 5000 UNIT/ML IJ SOLN
5000.0000 [IU] | Freq: Two times a day (BID) | INTRAMUSCULAR | Status: DC
  Administered 2020-03-03 – 2020-03-11 (×15): 5000 [IU] via SUBCUTANEOUS
  Filled 2020-03-03 (×15): qty 1

## 2020-03-03 MED ORDER — VANCOMYCIN HCL 2000 MG/400ML IV SOLN
2000.0000 mg | Freq: Once | INTRAVENOUS | Status: AC
  Administered 2020-03-03: 2000 mg via INTRAVENOUS
  Filled 2020-03-03 (×2): qty 400

## 2020-03-03 MED ORDER — PANTOPRAZOLE SODIUM 40 MG PO TBEC
40.0000 mg | DELAYED_RELEASE_TABLET | Freq: Every day | ORAL | Status: DC
  Administered 2020-03-04 – 2020-03-11 (×7): 40 mg via ORAL
  Filled 2020-03-03 (×10): qty 1

## 2020-03-03 MED ORDER — ONDANSETRON HCL 4 MG PO TABS: 4.0000 mg | ORAL_TABLET | Freq: Four times a day (QID) | ORAL | Status: DC | PRN

## 2020-03-03 MED ORDER — HYDROMORPHONE HCL 1 MG/ML IJ SOLN: 0.5000 mg | INTRAMUSCULAR | Status: DC | PRN

## 2020-03-03 MED ORDER — SODIUM CHLORIDE 0.9 % IV SOLN
1.0000 g | INTRAVENOUS | Status: DC
  Filled 2020-03-03: qty 1

## 2020-03-03 MED ORDER — SODIUM BICARBONATE 8.4 % IV SOLN
INTRAVENOUS | Status: AC
  Filled 2020-03-03 (×4): qty 850

## 2020-03-03 MED ORDER — HYDRALAZINE HCL 25 MG PO TABS: 25.0000 mg | ORAL_TABLET | Freq: Four times a day (QID) | ORAL | Status: DC | PRN

## 2020-03-03 MED ORDER — LACTATED RINGERS IV SOLN: INTRAVENOUS | Status: DC

## 2020-03-03 MED ORDER — VANCOMYCIN HCL IN DEXTROSE 1-5 GM/200ML-% IV SOLN: 1000.0000 mg | Freq: Once | INTRAVENOUS | Status: DC

## 2020-03-03 MED ORDER — QUETIAPINE FUMARATE 25 MG PO TABS
25.0000 mg | ORAL_TABLET | Freq: Every day | ORAL | Status: DC
  Filled 2020-03-03: qty 1

## 2020-03-03 MED ORDER — ACETAMINOPHEN 325 MG PO TABS
650.0000 mg | ORAL_TABLET | Freq: Four times a day (QID) | ORAL | Status: DC | PRN
  Administered 2020-03-05 – 2020-03-11 (×6): 650 mg via ORAL
  Filled 2020-03-03 (×6): qty 2

## 2020-03-03 MED ORDER — VANCOMYCIN VARIABLE DOSE PER UNSTABLE RENAL FUNCTION (PHARMACIST DOSING): Status: DC

## 2020-03-03 MED ORDER — TAMSULOSIN HCL 0.4 MG PO CAPS
0.4000 mg | ORAL_CAPSULE | Freq: Every day | ORAL | Status: DC
  Administered 2020-03-04 – 2020-03-09 (×4): 0.4 mg via ORAL
  Filled 2020-03-03 (×7): qty 1

## 2020-03-03 NOTE — ED Triage Notes (Addendum)
PT from [facility] and staff found PT unresponsive . Staff unaware of Pt's medical HX. Pt grunts and moans when moved or asked questions. EMS reported Pt BP 80/50  And gave NS with increased BP 116/60.  PT does not answer questions.

## 2020-03-03 NOTE — Progress Notes (Signed)
Gave report to [place] for 6E-04

## 2020-03-03 NOTE — H&P (Addendum)
History and Physical    Christian Warren. JGO:[id number] DOB: 1932/09/29 DOA: 03/03/2020  PCP: Retia Passe, NP (Confirm with patient/family/NH records and if not entered, this has to be entered at [facility] point of entry) Patient coming from: SNF  I have personally briefly reviewed patient's old medical records in [facility]  Chief Complaint: Patient was found unresponsive  HPI: Christian Warren. is a 86 y.o. male with medical history significant of HTN, BPH, OA, was found unresponsive at nursing home.  Patient with recently hospitalized for rhabdomyolysis and AKI and then discharged back to nursing home.  Patient was last seen normal last night.  This morning patient was found unresponsive but only moaning.  EMS arrived and found patient blood pressure 80/50, blood pressure improved to 116/60 after 200 mL normal saline bolus and patient became awake and responded to questions.  Patient remained very confused in ED, however patient reported pain in the suprapubic area, but denied any dysuria no fever or chills.  No back pains. ED Course: Patient was found to have fever of 101.6, blood pressure borderline low but responded to IV boluses 2.7 L of LR.  Chest x-ray showed no acute infiltrates.  Blood work, WBC 16, creatinine 6.1, BUN 92, bicarb 14.  Bladder scan was more than 700 urine retention, Foley inserted and UA showed UTI  Review of Systems: Unable to perform patient confused   Past Medical History:  Diagnosis Date  . Arthritis   . Hypertension     No past surgical history on file.   reports that he has never smoked. His smokeless tobacco use includes chew. He reports current alcohol use of about 3.0 standard drinks of alcohol per week. He reports that he does not use drugs.  Allergies  Allergen Reactions  . Orange Juice [Orange Oil] Itching    No family history on file.   Prior to Admission medications   Medication Sig Start Date End Date Taking? Authorizing Provider   acetaminophen (TYLENOL) 325 MG tablet Take 650 mg by mouth every 6 (six) hours as needed for mild pain, fever or headache.    [provider]  amLODipine (NORVASC) 10 MG tablet Take 10 mg by mouth daily. 02/10/19   [provider]  aspirin EC 81 MG tablet Take 1 tablet by mouth daily.    [provider]  QUEtiapine (SEROQUEL) 25 MG tablet Take 1 tablet (25 mg total) by mouth at bedtime. 02/08/20   Lewie Chamber, MD  tamsulosin (FLOMAX) 0.4 MG CAPS capsule Take 1 capsule by mouth daily. 11/30/14   [provider]    Physical Exam: Vitals:   03/03/20 1345 03/03/20 1400 03/03/20 1430 03/03/20 1445  BP: 112/69 97/63 95/66  (!) 120/97  Pulse: 79 80 79 (!) 113  Resp: 14 14 14  (!) 30  Temp:      TempSrc:      SpO2: 100% 99% 99% 93%  Weight:      Height:        Constitutional: NAD, calm, comfortable Vitals:   03/03/20 1345 03/03/20 1400 03/03/20 1430 03/03/20 1445  BP: 112/69 97/63 95/66  (!) 120/97  Pulse: 79 80 79 (!) 113  Resp: 14 14 14  (!) 30  Temp:      TempSrc:      SpO2: 100% 99% 99% 93%  Weight:      Height:       Eyes: PERRL, lids and conjunctivae normal ENMT: Mucous membranes are moist. Posterior pharynx clear of any  exudate or lesions.Normal dentition.  Neck: normal, supple, no masses, no thyromegaly Respiratory: clear to auscultation bilaterally, no wheezing, no crackles. Normal respiratory effort. No accessory muscle use.  Cardiovascular: Regular rate and rhythm, no murmurs / rubs / gallops. No extremity edema. 2+ pedal pulses. No carotid bruits.  Abdomen: no tenderness, no masses palpated. No hepatosplenomegaly. Bowel sounds positive.  Musculoskeletal: no clubbing / cyanosis. No joint deformity upper and lower extremities. Good ROM, no contractures. Normal muscle tone.  Skin: no rashes, lesions, ulcers. No induration Neurologic: CN 2-12 grossly intact. Sensation intact, DTR normal. Strength 5/5 in all 4.  Psychiatric: Awake  confused    Labs on Admission: I have personally reviewed following labs and imaging studies  CBC: Recent Labs  Lab 03/03/20 1120  WBC 16.4*  NEUTROABS 13.8*  HGB 11.3*  HCT 35.3*  MCV 98.6  PLT 150   Basic Metabolic Panel: Recent Labs  Lab 03/03/20 1120  NA 136  K 3.9  CL 108  CO2 13*  GLUCOSE 119*  BUN 92*  CREATININE 6.11*  CALCIUM 9.0   GFR: Estimated Creatinine Clearance: 9.9 mL/min (A) (by C-G formula based on SCr of 6.11 mg/dL (H)). Liver Function Tests: Recent Labs  Lab 03/03/20 1120  AST 22  ALT 19  ALKPHOS 90  BILITOT 0.9  PROT 6.2*  ALBUMIN 3.0*   Recent Labs  Lab 03/03/20 1120  LIPASE 46   No results for input(s): AMMONIA in the last 168 hours. Coagulation Profile: Recent Labs  Lab 03/03/20 1120  INR 1.3*   Cardiac Enzymes: Recent Labs  Lab 03/03/20 1120  CKTOTAL 34*   BNP (last 3 results) No results for input(s): PROBNP in the last 8760 hours. HbA1C: No results for input(s): HGBA1C in the last 72 hours. CBG: Recent Labs  Lab 03/03/20 1101  GLUCAP 119*   Lipid Profile: No results for input(s): CHOL, HDL, LDLCALC, TRIG, CHOLHDL, LDLDIRECT in the last 72 hours. Thyroid Function Tests: No results for input(s): TSH, T4TOTAL, FREET4, T3FREE, THYROIDAB in the last 72 hours. Anemia Panel: No results for input(s): VITAMINB12, FOLATE, FERRITIN, TIBC, IRON, RETICCTPCT in the last 72 hours. Urine analysis:    Component Value Date/Time   COLORURINE YELLOW 03/03/2020 1359   APPEARANCEUR HAZY (A) 03/03/2020 1359   LABSPEC 1.010 03/03/2020 1359   PHURINE 5.0 03/03/2020 1359   GLUCOSEU NEGATIVE 03/03/2020 1359   HGBUR MODERATE (A) 03/03/2020 1359   BILIRUBINUR NEGATIVE 03/03/2020 1359   KETONESUR NEGATIVE 03/03/2020 1359   PROTEINUR NEGATIVE 03/03/2020 1359   NITRITE NEGATIVE 03/03/2020 1359   LEUKOCYTESUR MODERATE (A) 03/03/2020 1359    Radiological Exams on Admission: CT Head Wo Contrast  Result Date: 03/03/2020 CLINICAL  DATA:  Mental status change. EXAM: CT HEAD WITHOUT CONTRAST TECHNIQUE: Contiguous axial images were obtained from the base of the skull through the vertex without intravenous contrast. COMPARISON:  CT 02/04/2020 FINDINGS: Brain: Similar advanced patchy white matter hypoattenuation, compatible with the sequela of chronic microvascular ischemic disease. Similar remote lacunar infarcts involving the right caudate and possibly in the right frontal white matter. No evidence of acute large vascular territory infarct. No acute hemorrhage. No abnormal mass lesion or extra-axial collection. Similar moderate diffuse cerebral atrophy with ex vacuo ventricular dilation. Partially empty sella. Vascular: Calcific atherosclerosis. Skull: Normal. Negative for fracture or focal lesion. Sinuses/Orbits: No substantial paranasal sinus disease. No acute orbital abnormality. Other: No mastoid effusion. IMPRESSION: 1. No evidence of acute intracranial abnormality. 2. Similar diffuse cerebral atrophy, chronic microvascular ischemic disease, and  remote lacunar infarcts. Electronically Signed   By: Feliberto Harts MD   On: 03/03/2020 12:01   DG Chest Portable 1 View  Result Date: 03/03/2020 CLINICAL DATA:  Altered mental status.  Code sepsis. EXAM: PORTABLE CHEST 1 VIEW COMPARISON:  Chest radiograph and CT 02/04/2020. FINDINGS: Mild enlargement the cardiac silhouette, similar to prior. Aortic atherosclerosis. Linear bibasilar opacities, favor atelectasis. No focal consolidation. No visible pleural effusions. No pneumothorax. The visualized skeletal structures are unremarkable. IMPRESSION: Linear bibasilar opacities, favor atelectasis. Electronically Signed   By: Feliberto Harts MD   On: 03/03/2020 11:56    EKG: Pending  Assessment/Plan Active Problems:   Sepsis (HCC)  (please populate well all problems here in Problem List. (For example, if patient is on BP meds at home and you resume or decide to hold them, it is a problem  that needs to be her. Same for CAD, COPD, HLD and so on)  Sepsis -Evidenced by hypotension, fever, leukocytosis, with signs of endorgan damage with AKI and encephalopathy.  Source most likely urinary, and suspect this is complicated UTI/pyelonephritis with venting from last front bilateral kidney stones. -Broad-spectrum antibiotics vancomycin and cefepime, will consider discontinue vancomycin once source is defined.  AKI with acute uremia, azotemia and non-anion gap metabolic acidosis -Suspect multifactorial, sepsis and post renal as patient had urinary retention with more than 700 urine drained from bladder after Foley inserted. -Received 2.7 L of IV boluses in the ED, recheck BMP, if persistent acidosis will switch both to bicarb drip. -Repeat BMP at 15:00 showed rapid improvement of creatinine 6.11>5.4, CO2 remains low at 14, D/W nephro attending Dr. Arlean Hopping over phone, his impression is that the AKI likely post-renal from urinary retention probably from BPH, recommends continue aggressive hydration with bicarb drip, and repeat renal U/S in 24 hours, call Nephro if no improvement.  Acute metabolic encephalopathy -From sepsis, uremia, and acidosis -Antibiotics, aggressive hydration -Strict IO's  Acute urinary retention -Probably from combined effect of UTI and BPH -Consider continue indwelling Foley 3 to 5 days and void trial  HTN -Hold BP meds   DVT prophylaxis: Heparin subcu Code Status: Full code Family Communication: Niece Sandy over phone Disposition Plan: Pt is sick with Sepsis, AKI and acidosis, expect 3-5 days hospital stay Consults called: Curbside consult with Nephro Dr. Arlean Hopping Admission status: PCU   Emeline General MD [facility] Pager [phone]  03/03/2020, 3:22 PM

## 2020-03-03 NOTE — Progress Notes (Addendum)
Pharmacy Antibiotic Note  Barbara Keng. is a 86 y.o. male admitted on 03/03/2020 with sepsis.  Pharmacy has been consulted for Cefepime and Vancomycin  dosing.   Height: 6' (182.9 cm) Weight: 90.7 kg (200 lb) IBW/kg (Calculated) : 77.6  Temp (24hrs), Avg:101.6 F (38.7 C), Min:101.6 F (38.7 C), Max:101.6 F (38.7 C)  Recent Labs  Lab 03/03/20 1120  WBC 16.4*  CREATININE 6.11*  LATICACIDVEN 1.7    Estimated Creatinine Clearance: 9.9 mL/min (A) (by C-G formula based on SCr of 6.11 mg/dL (H)).    Allergies  Allergen Reactions  . Orange Juice [Orange Oil] Itching    Antimicrobials this admission: 9/25 Cefepime >>  9/28 Vancomycin >>   Dose adjustments this admission: N/a  Microbiology results: Pending   Plan:  - Cefepime 1g IV q24h - Vancomycin 2000mg  IV x 1 dose  - Will dose vancomycin based on random levels due to severe AKI (scr 6.11 baseline ~1.1)  - Monitor patients renal function and urine output  - De-escalate ABX when appropriate   Thank you for allowing pharmacy to be a part of this patient's care.  PharmD. BCPS 03/03/2020 1:33 PM
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
## 2020-03-04 ENCOUNTER — Inpatient Hospital Stay (HOSPITAL_COMMUNITY): Payer: Medicare HMO

## 2020-03-04 DIAGNOSIS — R6520 Severe sepsis without septic shock: Secondary | ICD-10-CM

## 2020-03-04 DIAGNOSIS — N10 Acute tubulo-interstitial nephritis: Secondary | ICD-10-CM

## 2020-03-04 DIAGNOSIS — D72829 Elevated white blood cell count, unspecified: Secondary | ICD-10-CM

## 2020-03-04 DIAGNOSIS — E872 Acidosis: Secondary | ICD-10-CM

## 2020-03-04 DIAGNOSIS — N179 Acute kidney failure, unspecified: Secondary | ICD-10-CM

## 2020-03-04 DIAGNOSIS — G9340 Encephalopathy, unspecified: Secondary | ICD-10-CM

## 2020-03-04 LAB — URINE CULTURE: Culture: NO GROWTH

## 2020-03-04 LAB — CBC
HCT: 28.6 % — ABNORMAL LOW (ref 39.0–52.0)
Hemoglobin: 9.5 g/dL — ABNORMAL LOW (ref 13.0–17.0)
MCH: 31.7 pg (ref 26.0–34.0)
MCHC: 33.2 g/dL (ref 30.0–36.0)
MCV: 95.3 fL (ref 80.0–100.0)
Platelets: 112 10*3/uL — ABNORMAL LOW (ref 150–400)
RBC: 3 MIL/uL — ABNORMAL LOW (ref 4.22–5.81)
RDW: 14.2 % (ref 11.5–15.5)
WBC: 13.6 10*3/uL — ABNORMAL HIGH (ref 4.0–10.5)
nRBC: 0 % (ref 0.0–0.2)

## 2020-03-04 LAB — BASIC METABOLIC PANEL
Anion gap: 12 (ref 5–15)
BUN: 79 mg/dL — ABNORMAL HIGH (ref 8–23)
CO2: 16 mmol/L — ABNORMAL LOW (ref 22–32)
Calcium: 8.6 mg/dL — ABNORMAL LOW (ref 8.9–10.3)
Chloride: 112 mmol/L — ABNORMAL HIGH (ref 98–111)
Creatinine, Ser: 4.63 mg/dL — ABNORMAL HIGH (ref 0.61–1.24)
GFR calc Af Amer: 13 mL/min — ABNORMAL LOW (ref >60)
GFR calc non Af Amer: 11 mL/min — ABNORMAL LOW (ref >60)
Glucose, Bld: 112 mg/dL — ABNORMAL HIGH (ref 70–99)
Potassium: 3.3 mmol/L — ABNORMAL LOW (ref 3.5–5.1)
Sodium: 140 mmol/L (ref 135–145)

## 2020-03-04 MED ORDER — SODIUM CHLORIDE 0.9 % IV SOLN
2.0000 g | INTRAVENOUS | Status: DC
  Administered 2020-03-04 – 2020-03-07 (×4): 2 g via INTRAVENOUS
  Filled 2020-03-04 (×6): qty 2

## 2020-03-04 MED ORDER — CHLORHEXIDINE GLUCONATE CLOTH 2 % EX PADS
6.0000 | MEDICATED_PAD | Freq: Every day | CUTANEOUS | Status: DC
  Administered 2020-03-04 – 2020-03-11 (×7): 6 via TOPICAL

## 2020-03-04 MED ORDER — POTASSIUM CHLORIDE CRYS ER 20 MEQ PO TBCR
40.0000 meq | EXTENDED_RELEASE_TABLET | Freq: Once | ORAL | Status: AC
  Administered 2020-03-04: 40 meq via ORAL
  Filled 2020-03-04: qty 2
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
## 2020-03-04 NOTE — Progress Notes (Signed)
Pharmacy Antibiotic Note  Christian Warren. is a 86 y.o. male admitted on 03/03/2020 with sepsis. The patient did receive 1 dose of vancomycin 2000 mg and cefepime 2g while in the ED. Pharmacy has been consulted for cefepime dosing.  The patient's serum creatinine upon presentation was 6.11 and has since improved to 4.63. The patient's baseline serum creatinine is estimated to be 1.2-1.4. The suspected source of sepsis is thought to be urinary but is still being determined.   Height: 6' (182.9 cm) Weight: 88.1 kg (194 lb 3.6 oz) IBW/kg (Calculated) : 77.6  Temp (24hrs), Avg:98.1 F (36.7 C), Min:97.1 F (36.2 C), Max:101.6 F (38.7 C)  Recent Labs  Lab 03/03/20 1120 03/03/20 1452 03/04/20 0331  WBC 16.4*  --  13.6*  CREATININE 6.11* 5.42* 4.63*  LATICACIDVEN 1.7 1.2  --     Estimated Creatinine Clearance: 13 mL/min (A) (by C-G formula based on SCr of 4.63 mg/dL (H)).    Allergies  Allergen Reactions  . Orange Juice [Orange Oil] Itching    Antimicrobials this admission: 9/25 Cefepime >>  9/25 Vancomycin x1 dose  Dose adjustments this admission: Cefepime initially dosed at 1g q24h, however, due to patient's improvement in renal function, dose was increased to 2g q24h per antimicrobial dosing guidelines.  Microbiology results: 9/25 BCx NGTD 9/25 BCx sent 9/25 UCx sent   Plan:  - Continue cefepime IV 2g q24h  - Monitor renal function, urine output, clinical status and length of therapy - Deescalate antibiotics and length of therapy when appropriate   Thank you for allowing pharmacy to be a part of this patient's care.  Sanda Klein, PharmD, RPh  PGY-1 Pharmacy Resident 03/04/2020 9:28 AM  Please check [email or web address] for unit-specific pharmacy phone numbers.

## 2020-03-04 NOTE — Evaluation (Signed)
Clinical/Bedside Swallow Evaluation Patient Details  Name: Christian Warren. MRN: [id number] Date of Birth: 1933/02/14  Today's Date: 03/04/2020 Time: SLP Start Time (ACUTE ONLY): 1447 SLP Stop Time (ACUTE ONLY): 1503 SLP Time Calculation (min) (ACUTE ONLY): 16 min  Past Medical History:  Past Medical History:  Diagnosis Date  . Arthritis   . Hypertension    Past Surgical History: No past surgical history on file. HPI:   Christian Warren. is a 86 y.o. male with medical history significant of HTN, BPH, OA, was found unresponsive at nursing home.  Patient with recently hospitalized for rhabdomyolysis and AKI and then discharged back to nursing home.  Head CT was negative for acute intracranial abnormality.  CXR was remarkable for "linear bibasilar opacities, favoring atelectasis."   Assessment / Plan / Recommendation Clinical Impression  Pt was seen for a bedside swallow evaluation and he presents with moderate oral dysphagia.  Pt was encountered awake/alert and was agreeable to PO trials.  He had appeared to have some difficulty hearing and was unable to follow commands for a full oral mechanism evaluation.  Oral cavity appeared functional with some missing dentition per limited observation.  He consumed trials of thin liquid, puree, Dysphagia 2 (ground) solids, and regular solids.  Mastication of ground solids and regular solids was significantly prolonged with moderate-severe oral residue.  Pt was able to clear cracker residue given multiple liquid washes, but he was unable to clear residue from the ground solid trial despite effort.  Pt eventually expelled the bolus from his oral cavity upon SLP request.  AP transport appeared timely with puree and thin liquid trials and no overt s/sx of aspiration were observed with any PO trials during this evaluation.  Recommend diet change to Dysphagia 1 (puree) solids and thin liquids with medications administered crushed in puree.  SLP will f/u for diagnostic  treatment per POC.   *Of note, pt said that he should "Go ahead and shoot [him]self and get it over with" multiple times during this evaluation.  RN was made aware.    SLP Visit Diagnosis: Dysphagia, oral phase (R13.11)    Aspiration Risk  Mild aspiration risk    Diet Recommendation Dysphagia 1 (Puree);Thin liquid   Liquid Administration via: Cup;Straw Medication Administration: Crushed with puree Supervision: Intermittent supervision to cue for compensatory strategies;Patient able to self feed Compensations: Minimize environmental distractions;Slow rate;Small sips/bites;Lingual sweep for clearance of pocketing Postural Changes: Seated upright at 90 degrees    Other  Recommendations Oral Care Recommendations: Oral care BID   Follow up Recommendations Defer to PT recommendations      Frequency and Duration min 2x/week  2 weeks       Prognosis Prognosis for Safe Diet Advancement: Fair      Swallow Study   General HPI:  [name]. is a 86 y.o. male with medical history significant of HTN, BPH, OA, was found unresponsive at nursing home.  Patient with recently hospitalized for rhabdomyolysis and AKI and then discharged back to nursing home.  Head CT was negative for acute intracranial abnormality.  CXR was remarkable for "linear bibasilar opacities, favoring atelectasis." Type of Study: Bedside Swallow Evaluation Previous Swallow Assessment: None  Diet Prior to this Study: Dysphagia 2 (chopped);Thin liquids Temperature Spikes Noted: Yes Respiratory Status: Room air History of Recent Intubation: No Behavior/Cognition: Alert;Cooperative Oral Cavity Assessment: Within Functional Limits Oral Care Completed by SLP: No Oral Cavity - Dentition: Missing dentition Vision: Functional for self-feeding Self-Feeding Abilities: Able to feed self;Needs  set up Patient Positioning: Upright in bed Baseline Vocal Quality: Normal Volitional Swallow: Able to elicit    Oral/Motor/Sensory  Function Overall Oral Motor/Sensory Function: Other (comment) (Pt with difficulty following commands )   Ice Chips Ice chips: Not tested   Thin Liquid Thin Liquid: Within functional limits Presentation: Cup;Straw;Self Fed    Nectar Thick Nectar Thick Liquid: Not tested   Honey Thick Honey Thick Liquid: Not tested   Puree Puree: Within functional limits Presentation: Spoon   Solid     Solid: Impaired Presentation: Spoon Oral Phase Impairments: Impaired mastication Oral Phase Functional Implications: Impaired mastication;Prolonged oral transit;Oral residue     Villa Herb M.S., CCC-SLP Acute Rehabilitation Services Office: [phone]  Shanon Rosser Mekhia Brogan 03/04/2020,3:13 PM

## 2020-03-04 NOTE — Progress Notes (Signed)
Patient ID: Christian Gentile., male   DOB: February 14, 1933, 86 y.o.   MRN: [id number]  PROGRESS NOTE    Christian Gentile.  EQA:[id number] DOB: 16-Sep-1932 DOA: 03/03/2020 PCP: Retia Passe, NP   Brief Narrative:  86 year old male with history of hypertension, BPH, OA, recent hospitalization for rhabdomyolysis and AKI with subsequent discharge to nursing home was found unresponsive at nursing home.  On presentation, initial blood pressure was 80/50 which improved to 116/60 after 200 cc normal saline bolus following which patient became more awake and responded to questions but was still confused.  He was found to have temperature of 101.6.  Chest x-ray showed no acute infiltrates.  UA was positive.  WBC of 16, creatinine 6.1, bicarb of 14.  Bladder scan was showing more than 700 cc of urine, Foley catheter was inserted.  Nephrology was curb sided who recommended aggressive hydration and repeat renal ultrasound and if no improvement in renal function, to consult nephrology officially.  Assessment & Plan:   Severe sepsis: Present on admission -Patient presented with hypotension, fever, leukocytosis with end-stage organ damage including AKI and encephalopathy -Probably from UTI/pyelonephritis -Antibiotic plan as below.  Continue current IV fluids.  Cultures negative so far.  COVID-19 testing was negative on presentation  UTI/probable acute pyelonephritis -Continue cefepime.  Follow cultures.  Received a dose of vancomycin on presentation.  AKI Non-anion gap metabolic acidosis -Probably post renal from BPH with concerns for obstructive uropathy -Foley catheter inserted in the ED and more than 700 cc fluid drained -Continue bicarb drip for now.  Creatinine 6.11 on presentation.  4.63 today.  Bicarb still 16.  Initial renal ultrasound showed mild bilateral hydronephrosis.  Nephrology was curb sided by admitting provider: Nephrology had recommended to continue with aggressive IV fluid hydration with bicarb drip  and repeat renal ultrasound in 24 hours.  If no improvement, will call nephrology officially.  Follow repeat renal ultrasound  Thrombocytopenia -Questionable cause.  No signs of bleeding.  Monitor  Hypokalemia -Replace.  Repeat a.m. labs  Acute metabolic encephalopathy -From all of the above.  Monitor mental status.  Still confused.  Fall precaution.  PT evaluation  Acute urinary retention -Probably from UTI and BPH.  Continue Flomax.  Continue indwelling Foley catheter for now and consider voiding trial in 3 to 5 days.  Might need outpatient urology evaluation  Leukocytosis -Improving.  Monitor  Hypertension -Blood pressure on the lower side.  Blood pressure medications on hold.  Generalized conditioning - will need PT evaluation once a little more stable. -Palliative care consultation for goals of care discussion  DVT prophylaxis: Heparin Code Status: Full  family Communication: None at bedside Disposition Plan: Status is: Inpatient  Remains inpatient appropriate because:Inpatient level of care appropriate due to severity of illness   Dispo: The patient is from: SNF              Anticipated d/c is to: SNF              Anticipated d/c date is: 3 days              Patient currently is not medically stable to d/c.  Consultants: Nephrology was consulted by admitting provider  Procedures: None  Antimicrobials: Cefepime from 03/03/2020 onwards.  One dose of vancomycin and Flagyl on 03/03/2020   Subjective: Patient seen and examined at bedside.  Poor historian.  Awake, confused, keeps repeating that he wants something to drink.  No overnight fever, vomiting, worsening diarrhea reported.  Objective:  Vitals:   03/04/20 0219 03/04/20 0317 03/04/20 0419 03/04/20 0718  BP: 94/66 98/73 109/65 119/78  Pulse:  72 77 72  Resp: 15 16 16 17   Temp:   97.9 F (36.6 C) (!) 97.4 F (36.3 C)  TempSrc:   Oral Oral  SpO2:  98% 96% 95%  Weight:      Height:        Intake/Output  Summary (Last 24 hours) at 03/04/2020 0838 Last data filed at 03/04/2020 0500 Gross per 24 hour  Intake 3080.24 ml  Output 2200 ml  Net 880.24 ml   Filed Weights   03/03/20 1109 03/03/20 2119  Weight: 90.7 kg 88.1 kg    Examination:  General exam: Appears calm and comfortable.  Looks chronically ill. Respiratory system: Bilateral decreased breath sounds at bases with scattered crackles Cardiovascular system: S1 & S2 heard, Rate controlled Gastrointestinal system: Abdomen is nondistended, soft and nontender. Normal bowel sounds heard. Extremities: No cyanosis, clubbing; trace lower extremity edema present Central nervous system: Poor historian.  Awake, confused, keeps repeating that he wants something to drink.  No focal neurological deficits. Moving extremities Skin: No rashes, lesions or ulcers Psychiatry: Could not be assessed because of mental status    Data Reviewed: I have personally reviewed following labs and imaging studies  CBC: Recent Labs  Lab 03/03/20 1120 03/04/20 0331  WBC 16.4* 13.6*  NEUTROABS 13.8*  --   HGB 11.3* 9.5*  HCT 35.3* 28.6*  MCV 98.6 95.3  PLT 150 112*   Basic Metabolic Panel: Recent Labs  Lab 03/03/20 1120 03/03/20 1452 03/04/20 0331  NA 136 137 140  K 3.9 4.4 3.3*  CL 108 108 112*  CO2 13* 14* 16*  GLUCOSE 119* 130* 112*  BUN 92* 87* 79*  CREATININE 6.11* 5.42* 4.63*  CALCIUM 9.0 8.5* 8.6*   GFR: Estimated Creatinine Clearance: 13 mL/min (A) (by C-G formula based on SCr of 4.63 mg/dL (H)). Liver Function Tests: Recent Labs  Lab 03/03/20 1120  AST 22  ALT 19  ALKPHOS 90  BILITOT 0.9  PROT 6.2*  ALBUMIN 3.0*   Recent Labs  Lab 03/03/20 1120  LIPASE 46   No results for input(s): AMMONIA in the last 168 hours. Coagulation Profile: Recent Labs  Lab 03/03/20 1120  INR 1.3*   Cardiac Enzymes: Recent Labs  Lab 03/03/20 1120  CKTOTAL 34*   BNP (last 3 results) No results for input(s): PROBNP in the last 8760  hours. HbA1C: No results for input(s): HGBA1C in the last 72 hours. CBG: Recent Labs  Lab 03/03/20 1101  GLUCAP 119*   Lipid Profile: No results for input(s): CHOL, HDL, LDLCALC, TRIG, CHOLHDL, LDLDIRECT in the last 72 hours. Thyroid Function Tests: Recent Labs    03/03/20 2155  TSH 1.556   Anemia Panel: No results for input(s): VITAMINB12, FOLATE, FERRITIN, TIBC, IRON, RETICCTPCT in the last 72 hours. Sepsis Labs: Recent Labs  Lab 03/03/20 1120 03/03/20 1452  LATICACIDVEN 1.7 1.2    Recent Results (from the past 240 hour(s))  Blood culture (routine x 2)     Status: None (Preliminary result)   Collection Time: 03/03/20 11:20 AM   Specimen: BLOOD  Result Value Ref Range Status   Specimen Description BLOOD RIGHT ANTECUBITAL  Final   Special Requests   Final    BOTTLES DRAWN AEROBIC AND ANAEROBIC Blood Culture results may not be optimal due to an inadequate volume of blood received in culture bottles   Culture   Final  NO GROWTH <12 HOURS Performed at [facility], [address] [place], [place] [address]    Report Status PENDING  Incomplete  Respiratory Panel by RT PCR (Flu A&B, Covid) - Nasopharyngeal Swab     Status: None   Collection Time: 03/03/20  2:30 PM   Specimen: Nasopharyngeal Swab  Result Value Ref Range Status   SARS Coronavirus 2 by RT PCR NEGATIVE NEGATIVE Final    Comment: (NOTE) SARS-CoV-2 target nucleic acids are NOT DETECTED.  The SARS-CoV-2 RNA is generally detectable in upper respiratoy specimens during the acute phase of infection. The lowest concentration of SARS-CoV-2 viral copies this assay can detect is 131 copies/mL. A negative result does not preclude SARS-Cov-2 infection and should not be used as the sole basis for treatment or other patient management decisions. A negative result may occur with  improper specimen collection/handling, submission of specimen other than nasopharyngeal swab, presence of viral mutation(s) within  the areas targeted by this assay, and inadequate number of viral copies (<131 copies/mL). A negative result must be combined with clinical observations, patient history, and epidemiological information. The expected result is Negative.  Fact Sheet for Patients:  https://www.moore.com/  Fact Sheet for Healthcare Providers:  https://www.young.biz/  This test is no t yet approved or cleared by the [place] FDA and  has been authorized for detection and/or diagnosis of SARS-CoV-2 by FDA under an Emergency Use Authorization (EUA). This EUA will remain  in effect (meaning this test can be used) for the duration of the COVID-19 declaration under Section 564(b)(1) of the Act, 21 U.S.C. section 360bbb-3(b)(1), unless the authorization is terminated or revoked sooner.     Influenza A by PCR NEGATIVE NEGATIVE Final   Influenza B by PCR NEGATIVE NEGATIVE Final    Comment: (NOTE) The Xpert Xpress SARS-CoV-2/FLU/RSV assay is intended as an aid in  the diagnosis of influenza from Nasopharyngeal swab specimens and  should not be used as a sole basis for treatment. Nasal washings and  aspirates are unacceptable for Xpert Xpress SARS-CoV-2/FLU/RSV  testing.  Fact Sheet for Patients: https://www.moore.com/  Fact Sheet for Healthcare Providers: https://www.young.biz/  This test is not yet approved or cleared by the [place] FDA and  has been authorized for detection and/or diagnosis of SARS-CoV-2 by  FDA under an Emergency Use Authorization (EUA). This EUA will remain  in effect (meaning this test can be used) for the duration of the  Covid-19 declaration under Section 564(b)(1) of the Act, 21  U.S.C. section 360bbb-3(b)(1), unless the authorization is  terminated or revoked. Performed at [facility], [address], [place], [place] [address]          Radiology Studies: CT Head Wo  Contrast  Result Date: 03/03/2020 CLINICAL DATA:  Mental status change. EXAM: CT HEAD WITHOUT CONTRAST TECHNIQUE: Contiguous axial images were obtained from the base of the skull through the vertex without intravenous contrast. COMPARISON:  CT 02/04/2020 FINDINGS: Brain: Similar advanced patchy white matter hypoattenuation, compatible with the sequela of chronic microvascular ischemic disease. Similar remote lacunar infarcts involving the right caudate and possibly in the right frontal white matter. No evidence of acute large vascular territory infarct. No acute hemorrhage. No abnormal mass lesion or extra-axial collection. Similar moderate diffuse cerebral atrophy with ex vacuo ventricular dilation. Partially empty sella. Vascular: Calcific atherosclerosis. Skull: Normal. Negative for fracture or focal lesion. Sinuses/Orbits: No substantial paranasal sinus disease. No acute orbital abnormality. Other: No mastoid effusion. IMPRESSION: 1. No evidence of  acute intracranial abnormality. 2. Similar diffuse cerebral atrophy, chronic microvascular ischemic disease, and remote lacunar infarcts. Electronically Signed   By: Feliberto HartsFrederick S Jones MD   On: 03/03/2020 12:01   US RENAL  Result Date: 03/03/2020 CLINICAL DATA:  AKI EXAM: RENAL / URINARY TRACT ULTRASOUND COMPLETE COMPARISON:  02/04/2020 CT abdomen pelvis and prior. 07/07/2014 renal ultrasound. FINDINGS: Right Kidney: Renal measurements: 11.2 x 4.7 x 6.2 cm = volume: 169.4 mL. Echogenicity within normal limits. No mass. Mild hydronephrosis visualized. Left Kidney: Renal measurements: 12.8 x 5.8 x 6.4 cm = volume: 249.8 mL. Echogenicity within normal limits. No mass. Mild hydronephrosis visualized. Bladder: Appears normal for degree of bladder distention. Indwelling Foley catheter. Other: None. IMPRESSION: Mild bilateral hydronephrosis. Parenchymal echogenicity within normal limits. Indwelling Foley catheter. Electronically Signed   By: Stana Buntinghikanele  Emekauwa M.D.    On: 03/03/2020 15:41   DG Chest Portable 1 View  Result Date: 03/03/2020 CLINICAL DATA:  Altered mental status.  Code sepsis. EXAM: PORTABLE CHEST 1 VIEW COMPARISON:  Chest radiograph and CT 02/04/2020. FINDINGS: Mild enlargement the cardiac silhouette, similar to prior. Aortic atherosclerosis. Linear bibasilar opacities, favor atelectasis. No focal consolidation. No visible pleural effusions. No pneumothorax. The visualized skeletal structures are unremarkable. IMPRESSION: Linear bibasilar opacities, favor atelectasis. Electronically Signed   By: Feliberto HartsFrederick S Jones MD   On: 03/03/2020 11:56        Scheduled Meds: . aspirin EC  81 mg Oral Daily  . heparin  5,000 Units Subcutaneous Q12H  . pantoprazole  40 mg Oral Daily  . tamsulosin  0.4 mg Oral Daily   Continuous Infusions: . ceFEPime (MAXIPIME) IV    . sodium bicarbonate (isotonic) 150 mEq in D5W 1000 mL infusion 125 mL/hr at 03/04/20 0500          Glade LloydKshitiz Deshun Sedivy, MD [facility] 03/04/2020, 8:38 AM

## 2020-03-05 DIAGNOSIS — G9341 Metabolic encephalopathy: Secondary | ICD-10-CM

## 2020-03-05 LAB — CBC WITH DIFFERENTIAL/PLATELET
Abs Immature Granulocytes: 0.32 10*3/uL — ABNORMAL HIGH (ref 0.00–0.07)
Basophils Absolute: 0 10*3/uL (ref 0.0–0.1)
Basophils Relative: 0 %
Eosinophils Absolute: 0.1 10*3/uL (ref 0.0–0.5)
Eosinophils Relative: 1 %
HCT: 30.3 % — ABNORMAL LOW (ref 39.0–52.0)
Hemoglobin: 10.6 g/dL — ABNORMAL LOW (ref 13.0–17.0)
Immature Granulocytes: 4 %
Lymphocytes Relative: 16 %
Lymphs Abs: 1.2 10*3/uL (ref 0.7–4.0)
MCH: 33 pg (ref 26.0–34.0)
MCHC: 35 g/dL (ref 30.0–36.0)
MCV: 94.4 fL (ref 80.0–100.0)
Monocytes Absolute: 1.2 10*3/uL — ABNORMAL HIGH (ref 0.1–1.0)
Monocytes Relative: 16 %
Neutro Abs: 4.8 10*3/uL (ref 1.7–7.7)
Neutrophils Relative %: 63 %
Platelets: 135 10*3/uL — ABNORMAL LOW (ref 150–400)
RBC: 3.21 MIL/uL — ABNORMAL LOW (ref 4.22–5.81)
RDW: 13.9 % (ref 11.5–15.5)
WBC: 7.7 10*3/uL (ref 4.0–10.5)
nRBC: 0 % (ref 0.0–0.2)

## 2020-03-05 LAB — COMPREHENSIVE METABOLIC PANEL
ALT: 19 U/L (ref 0–44)
AST: 24 U/L (ref 15–41)
Albumin: 2.6 g/dL — ABNORMAL LOW (ref 3.5–5.0)
Alkaline Phosphatase: 78 U/L (ref 38–126)
Anion gap: 12 (ref 5–15)
BUN: 59 mg/dL — ABNORMAL HIGH (ref 8–23)
CO2: 22 mmol/L (ref 22–32)
Calcium: 8.4 mg/dL — ABNORMAL LOW (ref 8.9–10.3)
Chloride: 101 mmol/L (ref 98–111)
Creatinine, Ser: 3.29 mg/dL — ABNORMAL HIGH (ref 0.61–1.24)
GFR calc Af Amer: 19 mL/min — ABNORMAL LOW (ref >60)
GFR calc non Af Amer: 16 mL/min — ABNORMAL LOW (ref >60)
Glucose, Bld: 117 mg/dL — ABNORMAL HIGH (ref 70–99)
Potassium: 3.2 mmol/L — ABNORMAL LOW (ref 3.5–5.1)
Sodium: 135 mmol/L (ref 135–145)
Total Bilirubin: 0.5 mg/dL (ref 0.3–1.2)
Total Protein: 5.6 g/dL — ABNORMAL LOW (ref 6.5–8.1)

## 2020-03-05 LAB — AMMONIA: Ammonia: 24 umol/L (ref 9–35)

## 2020-03-05 LAB — MAGNESIUM: Magnesium: 1.2 mg/dL — ABNORMAL LOW (ref 1.7–2.4)

## 2020-03-05 LAB — FOLATE: Folate: 8.5 ng/mL (ref >5.9)

## 2020-03-05 LAB — TSH: TSH: 2.526 u[IU]/mL (ref 0.350–4.500)

## 2020-03-05 LAB — VITAMIN B12: Vitamin B-12: 1077 pg/mL — ABNORMAL HIGH (ref 180–914)

## 2020-03-05 MED ORDER — MAGNESIUM SULFATE 2 GM/50ML IV SOLN
2.0000 g | Freq: Once | INTRAVENOUS | Status: AC
  Administered 2020-03-05: 2 g via INTRAVENOUS
  Filled 2020-03-05: qty 50

## 2020-03-05 MED ORDER — METOPROLOL TARTRATE 5 MG/5ML IV SOLN
5.0000 mg | Freq: Four times a day (QID) | INTRAVENOUS | Status: DC | PRN
  Administered 2020-03-05: 5 mg via INTRAVENOUS
  Filled 2020-03-05: qty 5

## 2020-03-05 MED ORDER — HALOPERIDOL LACTATE 5 MG/ML IJ SOLN
3.0000 mg | Freq: Four times a day (QID) | INTRAMUSCULAR | Status: DC | PRN
  Administered 2020-03-05: 3 mg via INTRAVENOUS
  Filled 2020-03-05 (×2): qty 1

## 2020-03-05 MED ORDER — POTASSIUM CHLORIDE CRYS ER 20 MEQ PO TBCR
40.0000 meq | EXTENDED_RELEASE_TABLET | Freq: Once | ORAL | Status: AC
  Administered 2020-03-05: 40 meq via ORAL
  Filled 2020-03-05: qty 2

## 2020-03-05 MED ORDER — QUETIAPINE FUMARATE 25 MG PO TABS
12.5000 mg | ORAL_TABLET | Freq: Every day | ORAL | Status: DC
  Administered 2020-03-05 – 2020-03-10 (×6): 12.5 mg via ORAL
  Filled 2020-03-05 (×6): qty 1

## 2020-03-05 MED ORDER — SODIUM CHLORIDE 0.9 % IV SOLN: INTRAVENOUS | Status: DC

## 2020-03-05 MED ORDER — POTASSIUM CHLORIDE CRYS ER 20 MEQ PO TBCR
40.0000 meq | EXTENDED_RELEASE_TABLET | ORAL | Status: AC
  Administered 2020-03-05: 40 meq via ORAL
  Filled 2020-03-05: qty 2

## 2020-03-05 NOTE — NC FL2 (Signed)
[place] MEDICAID FL2 LEVEL OF CARE SCREENING TOOL     IDENTIFICATION  Patient Name: Christian Warren. Birthdate: [birth year] Sex: male Admission Date (Current Location): 03/03/2020  [facility] and [place] Number:  Producer, television/film/video and Address:  [facility], [address], [place], [place] [address]      Provider Number: [id number]  Attending Physician Name and Address:  Glade Lloyd, MD  Relative Name and Phone Number:  Andrey Campanile [phone]    Current Level of Care: Hospital Recommended Level of Care: Skilled Nursing Facility Prior Approval Number:    Date Approved/Denied:   PASRR Number: [id number]  Discharge Plan: SNF    Current Diagnoses: Patient Active Problem List   Diagnosis Date Noted  . Sepsis (HCC) 03/03/2020  . Urinary retention   . Altered mental status   . Dehydration 02/04/2020  . Arthritis 12/30/2018  . Hyperlipidemia 12/30/2018  . Hypertension 12/30/2018  . Essential hypertension 12/30/2018  . Syncope and collapse 12/30/2018  . BPH (benign prostatic hyperplasia) 04/10/2017  . Chronic right shoulder pain 04/24/2015  . Memory impairment 04/24/2015  . Tremors of nervous system 04/24/2015  . Seizures (HCC) 12/21/2014    Orientation RESPIRATION BLADDER Height & Weight     Self  Normal Incontinent (Urethral Catheter JS Straight-tip 16Fr) Weight: 191 lb 5.8 oz (86.8 kg) Height:  6' (182.9 cm)  BEHAVIORAL SYMPTOMS/MOOD NEUROLOGICAL BOWEL NUTRITION STATUS      Continent (type 6 mushy consistency with jagged edges) Diet (See Discharge Summary)  AMBULATORY STATUS COMMUNICATION OF NEEDS Skin   Extensive Assist Verbally Skin abrasions, Other (Comment) (Dry;Abrasion;arm;hand;leg;toe;bilateral;foam;amputation toe right;ecchymosis arm;hand;bilateral)                       Personal Care Assistance Level of Assistance  Bathing, Feeding, Dressing Bathing Assistance: Maximum assistance Feeding assistance: Maximum assistance  (NPO) Dressing Assistance: Maximum assistance     Functional Limitations Info  Sight, Hearing, Speech Sight Info: Adequate Hearing Info: Adequate Speech Info: Adequate    SPECIAL CARE FACTORS FREQUENCY  PT (By licensed PT), OT (By licensed OT)     PT Frequency: 5x min weekly OT Frequency: 5x min weekly            Contractures Contractures Info: Not present    Additional Factors Info  Code Status, Allergies, Psychotropic Code Status Info: DNR Allergies Info: Orange Juice/Orange oil Psychotropic Info: QUEtiapine (SEROQUEL) tablet 12.5 mg daily at bedtime,         Current Medications (03/05/2020):  This is the current hospital active medication list Current Facility-Administered Medications  Medication Dose Route Frequency Provider Last Rate Last Admin  . 0.9 %  sodium chloride infusion   Intravenous Continuous Glade Lloyd, MD 75 mL/hr at 03/05/20 1100 New Bag at 03/05/20 1100  . acetaminophen (TYLENOL) tablet 650 mg  650 mg Oral Q6H PRN Emeline General, MD   650 mg at 03/05/20 0408  . aspirin EC tablet 81 mg  81 mg Oral Daily Mikey College T, MD   81 mg at 03/05/20 1750  . ceFEPIme (MAXIPIME) 2 g in sodium chloride 0.9 % 100 mL IVPB  2 g Intravenous Q24H Phillips Hay, RPH 200 mL/hr at 03/05/20 1759 2 g at 03/05/20 1759  . Chlorhexidine Gluconate Cloth 2 % PADS 6 each  6 each Topical Daily Glade Lloyd, MD   6 each at 03/05/20 1100  . haloperidol lactate (HALDOL) injection 3 mg  3 mg Intravenous Q6H PRN Opyd,  Lavone Neri, MD   3 mg at 03/05/20 0140  . heparin injection 5,000 Units  5,000 Units Subcutaneous Q12H Emeline General, MD   5,000 Units at 03/05/20 1100  . hydrALAZINE (APRESOLINE) tablet 25 mg  25 mg Oral Q6H PRN Mikey College T, MD      . metoprolol tartrate (LOPRESSOR) injection 5 mg  5 mg Intravenous Q6H PRN Opyd, Lavone Neri, MD   5 mg at 03/05/20 0354  . ondansetron (ZOFRAN) tablet 4 mg  4 mg Oral Q6H PRN Mikey College T, MD       Or  . ondansetron [facility])  injection 4 mg  4 mg Intravenous Q6H PRN Mikey College T, MD      . pantoprazole (PROTONIX) EC tablet 40 mg  40 mg Oral Daily Mikey College T, MD   40 mg at 03/05/20 1750  . QUEtiapine (SEROQUEL) tablet 12.5 mg  12.5 mg Oral QHS Alekh, Kshitiz, MD      . tamsulosin (FLOMAX) capsule 0.4 mg  0.4 mg Oral Daily Mikey College T, MD   0.4 mg at 03/05/20 1750     Discharge Medications: Please see discharge summary for a list of discharge medications.  Relevant Imaging Results:  Relevant Lab Results:   Additional Information [id number]  Terrial Rhodes, LCSWA

## 2020-03-05 NOTE — Progress Notes (Addendum)
Patient pulling at lines and heart monitor, alert to self only. Placed patient on mittens, will continue to monitor patient.   0140 Update:patient continuing to yell at staff and pull lines, including foley. Paged [facility] got order for Haldol. Will continue to monitor patient closely.

## 2020-03-05 NOTE — Progress Notes (Signed)
Patients Heart rate ranging from 100s-140s non sustaining. Patient also agitated, confused trying to pull mittens off. Will paged [facility].

## 2020-03-05 NOTE — Progress Notes (Signed)
Patient ID: Christian Warren., male   DOB: 1932/09/24, 86 y.o.   MRN: [id number]  PROGRESS NOTE    Christian Warren.  WUJ:[id number] DOB: 04-06-1933 DOA: 03/03/2020 PCP: Retia Passe, NP   Brief Narrative:  86 year old male with history of hypertension, BPH, OA, recent hospitalization for rhabdomyolysis and AKI with subsequent discharge to nursing home was found unresponsive at nursing home.  On presentation, initial blood pressure was 80/50 which improved to 116/60 after 200 cc normal saline bolus following which patient became more awake and responded to questions but was still confused.  He was found to have temperature of 101.6.  Chest x-ray showed no acute infiltrates.  UA was positive.  WBC of 16, creatinine 6.1, bicarb of 14.  Bladder scan was showing more than 700 cc of urine, Foley catheter was inserted.  Nephrology was curb sided who recommended aggressive hydration and repeat renal ultrasound and if no improvement in renal function, to consult nephrology officially.  Assessment & Plan:   Severe sepsis: Present on admission -Patient presented with hypotension, fever, leukocytosis with end-stage organ damage including AKI and encephalopathy -Probably from UTI/pyelonephritis -Antibiotic plan as below.  IV fluids as below.  Blood cultures growing gram-positive rods, anaerobic bottle only: Possibly contaminant.  COVID-19 testing was negative on presentation  UTI/probable acute pyelonephritis -Continue cefepime.  Urine culture negative so far.  Received a dose of vancomycin on presentation.  AKI Non-anion gap metabolic acidosis -Probably post renal from BPH with concerns for obstructive uropathy -Foley catheter inserted in the ED and more than 700 cc fluid drained -Currently on bicarb drip.  Creatinine 6.11 on presentation.  3.29 today.  Bicarb 22 today.  Initial renal ultrasound showed mild bilateral hydronephrosis.  Nephrology was curb sided by admitting provider: Nephrology had recommended  to continue with aggressive IV fluid hydration with bicarb drip and repeat renal ultrasound in 24 hours.  If no improvement, to call nephrology officially.   -Repeat ultrasound showed persistent but improved mild bilateral hydronephrosis, left worse than right -DC bicarb drip.  Start normal saline at 75 cc an hour  Thrombocytopenia -Questionable cause.  No signs of bleeding.  Monitor  Hypokalemia -Replace.  Repeat a.m. labs  Hypomagnesemia -Replace.  Repeat a.m. labs  Acute metabolic encephalopathy -From all of the above.  Monitor mental status.  Still confused and was agitated overnight requiring Haldol.  Fall precaution.  PT evaluation -Ammonia, folate, B12 and TSH levels normal -will start low-dose Seroquel at night.  Acute urinary retention -Probably from UTI and BPH.  Continue Flomax.  Continue indwelling Foley catheter for now and consider voiding trial in 3 to 5 days.  Might need outpatient urology evaluation  Leukocytosis -Resolved.  Hypertension -Blood pressure improving.  Blood pressure medications on hold.  Generalized conditioning - will need PT evaluation once a little more stable. -Palliative care following.  CODE STATUS has been changed to DNR.  DVT prophylaxis: Heparin Code Status: DNR  family Communication: None at bedside Disposition Plan: Status is: Inpatient  Remains inpatient appropriate because:Inpatient level of care appropriate due to severity of illness   Dispo: The patient is from: SNF              Anticipated d/c is to: SNF              Anticipated d/c date is: 3 days              Patient currently is not medically stable to d/c.  Consultants: Nephrology was curb  sided by admitting provider  Procedures: None  Antimicrobials: Cefepime from 03/03/2020 onwards.  One dose of vancomycin and Flagyl on 03/03/2020   Subjective: Patient seen and examined at bedside.  Poor historian.  Nursing staff reports episodes of confusion/agitation overnight.   No overnight fever or vomiting reported. Objective: Vitals:   03/04/20 2005 03/05/20 0403 03/05/20 0411 03/05/20 0629  BP: 126/75 123/71  (!) 145/89  Pulse: 91 (!) 113  90  Resp: 19 (!) 23  17  Temp: (!) 97.2 F (36.2 C) 98.3 F (36.8 C)  (!) 97.5 F (36.4 C)  TempSrc: Oral Oral  Oral  SpO2: 98% 97%  95%  Weight:   86.8 kg   Height:        Intake/Output Summary (Last 24 hours) at 03/05/2020 0725 Last data filed at 03/05/2020 0411 Gross per 24 hour  Intake 2850.32 ml  Output 3755 ml  Net -904.68 ml   Filed Weights   03/03/20 1109 03/03/20 2119 03/05/20 0411  Weight: 90.7 kg 88.1 kg 86.8 kg    Examination:  General exam: No acute distress.  Looks chronically ill. Currently does not look agitated  Respiratory system: Bilateral decreased breath sounds at bases with some crackles.  No wheezing.  Intermittently tachypneic Cardiovascular system: S1 & S2 heard, intermittently tachycardic Gastrointestinal system: Abdomen is nondistended, soft and nontender.  Bowel sounds are heard.   Extremities: Mild lower extremity edema present.  No clubbing Central nervous system: Poor historian.  Confused. Awake, hardly answers any questions. No focal neurological deficits.  Moves extremities Skin: No obvious ecchymosis/lesions Psychiatry: Cannot assess because of mental status  Data Reviewed: I have personally reviewed following labs and imaging studies  CBC: Recent Labs  Lab 03/03/20 1120 03/04/20 0331 03/05/20 0220  WBC 16.4* 13.6* 7.7  NEUTROABS 13.8*  --  4.8  HGB 11.3* 9.5* 10.6*  HCT 35.3* 28.6* 30.3*  MCV 98.6 95.3 94.4  PLT 150 112* 135*   Basic Metabolic Panel: Recent Labs  Lab 03/03/20 1120 03/03/20 1452 03/04/20 0331 03/05/20 0220  NA 136 137 140 135  K 3.9 4.4 3.3* 3.2*  CL 108 108 112* 101  CO2 13* 14* 16* 22  GLUCOSE 119* 130* 112* 117*  BUN 92* 87* 79* 59*  CREATININE 6.11* 5.42* 4.63* 3.29*  CALCIUM 9.0 8.5* 8.6* 8.4*  MG  --   --   --  1.2*    GFR: Estimated Creatinine Clearance: 18.3 mL/min (A) (by C-G formula based on SCr of 3.29 mg/dL (H)). Liver Function Tests: Recent Labs  Lab 03/03/20 1120 03/05/20 0220  AST 22 24  ALT 19 19  ALKPHOS 90 78  BILITOT 0.9 0.5  PROT 6.2* 5.6*  ALBUMIN 3.0* 2.6*   Recent Labs  Lab 03/03/20 1120  LIPASE 46   Recent Labs  Lab 03/05/20 0220  AMMONIA 24   Coagulation Profile: Recent Labs  Lab 03/03/20 1120  INR 1.3*   Cardiac Enzymes: Recent Labs  Lab 03/03/20 1120  CKTOTAL 34*   BNP (last 3 results) No results for input(s): PROBNP in the last 8760 hours. HbA1C: No results for input(s): HGBA1C in the last 72 hours. CBG: Recent Labs  Lab 03/03/20 1101  GLUCAP 119*   Lipid Profile: No results for input(s): CHOL, HDL, LDLCALC, TRIG, CHOLHDL, LDLDIRECT in the last 72 hours. Thyroid Function Tests: Recent Labs    03/05/20 0220  TSH 2.526   Anemia Panel: Recent Labs    03/05/20 0220  VITAMINB12 1,077*  FOLATE 8.5  Sepsis Labs: Recent Labs  Lab 03/03/20 1120 03/03/20 1452  LATICACIDVEN 1.7 1.2    Recent Results (from the past 240 hour(s))  Blood culture (routine x 2)     Status: None (Preliminary result)   Collection Time: 03/03/20 11:20 AM   Specimen: BLOOD  Result Value Ref Range Status   Specimen Description BLOOD RIGHT ANTECUBITAL  Final   Special Requests   Final    BOTTLES DRAWN AEROBIC AND ANAEROBIC Blood Culture results may not be optimal due to an inadequate volume of blood received in culture bottles   Culture  Setup Time   Final    GRAM POSITIVE RODS ANAEROBIC BOTTLE ONLY CRITICAL RESULT CALLED TO, READ BACK BY AND VERIFIED WITH: Carmell AustriaL. CHEN,PHARMD [id number] 03/05/2020 Girtha Hake. TYSOR Performed at [facility], [address] [place], [place] [address]    Culture GRAM POSITIVE RODS  Final   Report Status PENDING  Incomplete  Urine culture     Status: None   Collection Time: 03/03/20  1:49 PM   Specimen: Urine, Random  Result Value Ref  Range Status   Specimen Description URINE, RANDOM  Final   Special Requests NONE  Final   Culture   Final    NO GROWTH Performed at [facility], [address] [place], [place] [address]    Report Status 03/04/2020 FINAL  Final  Respiratory Panel by RT PCR (Flu A&B, Covid) - Nasopharyngeal Swab     Status: None   Collection Time: 03/03/20  2:30 PM   Specimen: Nasopharyngeal Swab  Result Value Ref Range Status   SARS Coronavirus 2 by RT PCR NEGATIVE NEGATIVE Final    Comment: (NOTE) SARS-CoV-2 target nucleic acids are NOT DETECTED.  The SARS-CoV-2 RNA is generally detectable in upper respiratoy specimens during the acute phase of infection. The lowest concentration of SARS-CoV-2 viral copies this assay can detect is 131 copies/mL. A negative result does not preclude SARS-Cov-2 infection and should not be used as the sole basis for treatment or other patient management decisions. A negative result may occur with  improper specimen collection/handling, submission of specimen other than nasopharyngeal swab, presence of viral mutation(s) within the areas targeted by this assay, and inadequate number of viral copies (<131 copies/mL). A negative result must be combined with clinical observations, patient history, and epidemiological information. The expected result is Negative.  Fact Sheet for Patients:  https://www.moore.com/https://www.fda.gov/media/142436/download  Fact Sheet for Healthcare Providers:  https://www.young.biz/https://www.fda.gov/media/142435/download  This test is no t yet approved or cleared by the [place] FDA and  has been authorized for detection and/or diagnosis of SARS-CoV-2 by FDA under an Emergency Use Authorization (EUA). This EUA will remain  in effect (meaning this test can be used) for the duration of the COVID-19 declaration under Section 564(b)(1) of the Act, 21 U.S.C. section 360bbb-3(b)(1), unless the authorization is terminated or revoked sooner.     Influenza A by PCR  NEGATIVE NEGATIVE Final   Influenza B by PCR NEGATIVE NEGATIVE Final    Comment: (NOTE) The Xpert Xpress SARS-CoV-2/FLU/RSV assay is intended as an aid in  the diagnosis of influenza from Nasopharyngeal swab specimens and  should not be used as a sole basis for treatment. Nasal washings and  aspirates are unacceptable for Xpert Xpress SARS-CoV-2/FLU/RSV  testing.  Fact Sheet for Patients: https://www.moore.com/https://www.fda.gov/media/142436/download  Fact Sheet for Healthcare Providers: https://www.young.biz/https://www.fda.gov/media/142435/download  This test is not yet approved or cleared by the [place] FDA and  has been authorized for detection and/or  diagnosis of SARS-CoV-2 by  FDA under an Emergency Use Authorization (EUA). This EUA will remain  in effect (meaning this test can be used) for the duration of the  Covid-19 declaration under Section 564(b)(1) of the Act, 21  U.S.C. section 360bbb-3(b)(1), unless the authorization is  terminated or revoked. Performed at [facility], [address] [place], [place] [address]   Blood culture (routine x 2)     Status: None (Preliminary result)   Collection Time: 03/03/20 10:04 PM   Specimen: BLOOD  Result Value Ref Range Status   Specimen Description BLOOD RIGHT THUMB  Final   Special Requests   Final    BOTTLES DRAWN AEROBIC ONLY Blood Culture results may not be optimal due to an inadequate volume of blood received in culture bottles   Culture   Final    NO GROWTH 2 DAYS Performed at [facility], [address], [place], [place] [address]    Report Status PENDING  Incomplete         Radiology Studies: CT Head Wo Contrast  Result Date: 03/03/2020 CLINICAL DATA:  Mental status change. EXAM: CT HEAD WITHOUT CONTRAST TECHNIQUE: Contiguous axial images were obtained from the base of the skull through the vertex without intravenous contrast. COMPARISON:  CT 02/04/2020 FINDINGS: Brain: Similar advanced patchy white matter hypoattenuation, compatible  with the sequela of chronic microvascular ischemic disease. Similar remote lacunar infarcts involving the right caudate and possibly in the right frontal white matter. No evidence of acute large vascular territory infarct. No acute hemorrhage. No abnormal mass lesion or extra-axial collection. Similar moderate diffuse cerebral atrophy with ex vacuo ventricular dilation. Partially empty sella. Vascular: Calcific atherosclerosis. Skull: Normal. Negative for fracture or focal lesion. Sinuses/Orbits: No substantial paranasal sinus disease. No acute orbital abnormality. Other: No mastoid effusion. IMPRESSION: 1. No evidence of acute intracranial abnormality. 2. Similar diffuse cerebral atrophy, chronic microvascular ischemic disease, and remote lacunar infarcts. Electronically Signed   By: Feliberto Harts MD   On: 03/03/2020 12:01   US RENAL  Result Date: 03/04/2020 CLINICAL DATA:  Acute kidney injury EXAM: RENAL / URINARY TRACT ULTRASOUND COMPLETE COMPARISON:  03/03/2020 FINDINGS: Right Kidney: Renal measurements: 10 x 4.1 x 4.6 cm = volume: 98.6 mL. Again noted is mild hydronephrosis. This appears improved from prior study. Left Kidney: Renal measurements: 11.7 x 4.9 x 5 cm = volume: 147 mL. Again noted is mild hydronephrosis that appears slightly improved from prior study. Bladder: The bladder is decompressed with a Foley catheter. Despite the degree of underdistention, the bladder wall appears to be thickened which may be secondary to chronic outlet obstruction. Other: The prostate gland is significantly enlarged measuring 7 x 4.8 cm. IMPRESSION: 1. Persistent but improved mild bilateral hydronephrosis, left worse than right. 2. Urinary bladder decompressed by Foley catheter. There appears to be diffuse bladder wall thickening despite the degree of underdistention which can be seen in patients with chronic outlet obstruction. 3. Prostatomegaly. Electronically Signed   By: Katherine Mantle M.D.   On:  03/04/2020 17:39   US RENAL  Result Date: 03/03/2020 CLINICAL DATA:  AKI EXAM: RENAL / URINARY TRACT ULTRASOUND COMPLETE COMPARISON:  02/04/2020 CT abdomen pelvis and prior. 07/07/2014 renal ultrasound. FINDINGS: Right Kidney: Renal measurements: 11.2 x 4.7 x 6.2 cm = volume: 169.4 mL. Echogenicity within normal limits. No mass. Mild hydronephrosis visualized. Left Kidney: Renal measurements: 12.8 x 5.8 x 6.4 cm = volume: 249.8 mL. Echogenicity within normal limits. No mass. Mild hydronephrosis  visualized. Bladder: Appears normal for degree of bladder distention. Indwelling Foley catheter. Other: None. IMPRESSION: Mild bilateral hydronephrosis. Parenchymal echogenicity within normal limits. Indwelling Foley catheter. Electronically Signed   By: Stana Bunting M.D.   On: 03/03/2020 15:41   DG Chest Portable 1 View  Result Date: 03/03/2020 CLINICAL DATA:  Altered mental status.  Code sepsis. EXAM: PORTABLE CHEST 1 VIEW COMPARISON:  Chest radiograph and CT 02/04/2020. FINDINGS: Mild enlargement the cardiac silhouette, similar to prior. Aortic atherosclerosis. Linear bibasilar opacities, favor atelectasis. No focal consolidation. No visible pleural effusions. No pneumothorax. The visualized skeletal structures are unremarkable. IMPRESSION: Linear bibasilar opacities, favor atelectasis. Electronically Signed   By: Feliberto Harts MD   On: 03/03/2020 11:56        Scheduled Meds: . aspirin EC  81 mg Oral Daily  . Chlorhexidine Gluconate Cloth  6 each Topical Daily  . heparin  5,000 Units Subcutaneous Q12H  . pantoprazole  40 mg Oral Daily  . tamsulosin  0.4 mg Oral Daily   Continuous Infusions: . ceFEPime (MAXIPIME) IV 2 g (03/04/20 1324)          Glade Lloyd, MD [facility] 03/05/2020, 7:25 AM

## 2020-03-05 NOTE — TOC Initial Note (Signed)
Transition of Care (TOC) - Initial/Assessment Note    Patient Details  Name: Christian Warren. MRN: [id number] Date of Birth: 1932/09/28  Transition of [facility]) CM/SW Contact:    Terrial Rhodes, LCSWA Phone Number: 03/05/2020, 5:53 PM  Clinical Narrative:                  CSW received consult for possible SNF placement at time of discharge. CSW spoke with patients niece Andrey Campanile regarding PT recommendation of SNF placement at time of discharge.Patients niece expressed understanding of PT recommendation and is agreeable to SNF placement at time of discharge. Patients niece is agreeable for patient to return to [place] for SNF.CSW confirmed with Inetta Fermo at [facility] that they can accept patient back for SNF. Facility will start insurance authorization for patient.Patient has not had their COVID vaccines.No further questions reported at this time. CSW to continue to follow and assist with discharge planning needs.    Expected Discharge Plan: Skilled Nursing Facility Barriers to Discharge: Continued Medical Work up   Patient Goals and CMS Choice   CMS Medicare.gov Compare Post Acute Care list provided to:: Patient Represenative (must comment) Andrey Campanile) Choice offered to / list presented to :  Andrey Campanile)  Expected Discharge Plan and Services Expected Discharge Plan: Skilled Nursing Facility       Living arrangements for the past 2 months:  (came from [place] short term)                                      Prior Living Arrangements/Services Living arrangements for the past 2 months:  (came from [place] short term) Lives with:: Self Patient language and need for interpreter reviewed:: Yes Do you feel safe going back to the place where you live?: No   SNF  Need for Family Participation in Patient Care: Yes (Comment) Care giver support system in place?: Yes (comment)   Criminal Activity/Legal Involvement Pertinent to Current Situation/Hospitalization: No -  Comment as needed  Activities of Daily Living      Permission Sought/Granted Permission sought to share information with : Case Manager, Family Supports, Oceanographer granted to share information with : Yes, Verbal Permission Granted  Share Information with NAME: Andrey Campanile  Permission granted to share info w AGENCY: SNF  Permission granted to share info w Relationship: Niece  Permission granted to share info w Contact Information: [phone]  Emotional Assessment       Orientation: : Oriented to Self Alcohol / Substance Use: Not Applicable Psych Involvement: No (comment)  Admission diagnosis:  Urinary retention [R33.9] AKI (acute kidney injury) (HCC) [N17.9] Sepsis (HCC) [A41.9] Hypotension, unspecified hypotension type [I95.9] Altered mental status, unspecified altered mental status type [R41.82] Sepsis, due to unspecified organism, unspecified whether acute organ dysfunction present [facility]) [A41.9] Patient Active Problem List   Diagnosis Date Noted  . Sepsis (HCC) 03/03/2020  . Urinary retention   . Altered mental status   . Dehydration 02/04/2020  . Arthritis 12/30/2018  . Hyperlipidemia 12/30/2018  . Hypertension 12/30/2018  . Essential hypertension 12/30/2018  . Syncope and collapse 12/30/2018  . BPH (benign prostatic hyperplasia) 04/10/2017  . Chronic right shoulder pain 04/24/2015  . Memory impairment 04/24/2015  . Tremors of nervous system 04/24/2015  . Seizures (HCC) 12/21/2014   PCP:  Retia Passe, NP Pharmacy:  No Pharmacies Listed    Social Determinants of Health (SDOH) Interventions  Readmission Risk Interventions No flowsheet data found.

## 2020-03-05 NOTE — Evaluation (Signed)
Physical Therapy Evaluation Patient Details Name: Christian Warren. MRN: [id number] DOB: [birth year] Today's Date: 03/05/2020   History of Present Illness  86 year old male with history of hypertension, BPH, OA, recent hospitalization for rhabdomyolysis and AKI with subsequent discharge to nursing home was found unresponsive at nursing home.  On presentation, initial blood pressure was 80/50 which improved to 116/60 after 200 cc normal saline bolus following which patient became more awake and responded to questions but was still confused.    Clinical Impression  Pt admitted with above diagnosis. Pt was able to sit EOB 10 min and perform exercises. Min guard assist needed once pt obtained balance EOB.  Will need SNF.  Pt currently with functional limitations due to the deficits listed below (see PT Problem List). Pt will benefit from skilled PT to increase their independence and safety with mobility to allow discharge to the venue listed below.      Follow Up Recommendations SNF;Supervision/Assistance - 24 hour    Equipment Recommendations  None recommended by PT    Recommendations for Other Services       Precautions / Restrictions Precautions Precautions: Fall Restrictions Weight Bearing Restrictions: No      Mobility  Bed Mobility Overal bed mobility: Needs Assistance Bed Mobility: Supine to Sit;Sit to Supine     Supine to sit: Mod assist;+2 for physical assistance Sit to supine: Mod assist;Max assist;+2 for physical assistance   General bed mobility comments: Assit for LEs and trunk to come to EOB with use of pad as well. Incr time to scoot pt EOB  Transfers Overall transfer level: Needs assistance Equipment used: 2 person hand held assist Transfers: Sit to/from Stand Sit to Stand: Mod assist;+2 physical assistance;From elevated surface;Max assist         General transfer comment: Attempted to stand pt and he cleared buttocks off bed with use of pad to scoot 1/2 inch  towared HOB.   Ambulation/Gait                Stairs            Wheelchair Mobility    Modified Rankin (Stroke Patients Only)       Balance Overall balance assessment: Needs assistance Sitting-balance support: Bilateral upper extremity supported;Feet supported Sitting balance-Leahy Scale: Poor Sitting balance - Comments: relies on UE support for balance. Min guard assist once pt obtained balance EOB which took incr time. Pt was able to sit for about 10 min at EOB Postural control: Posterior lean Standing balance support: Bilateral upper extremity supported;During functional activity Standing balance-Leahy Scale: Poor Standing balance comment: relies on UE support                             Pertinent Vitals/Pain Pain Assessment: Faces Faces Pain Scale: Hurts little more Pain Location: bil feet Pain Descriptors / Indicators: Aching;Grimacing;Guarding    Home Living Family/patient expects to be discharged to:: Skilled nursing facility                      Prior Function Level of Independence: Independent with assistive device(s)         Comments: Was in SNF PTA for therapy. Amb with RW prior to last admit. Has a tub/shower with tub bench.     Hand Dominance   Dominant Hand: Right    Extremity/Trunk Assessment   Upper Extremity Assessment Upper Extremity Assessment: Defer to OT evaluation  Lower Extremity Assessment Lower Extremity Assessment: RLE deficits/detail;LLE deficits/detail RLE Deficits / Details: tightness to stretch, grossly 3-/5 LLE Deficits / Details: tightness to stretch, grossly 3-/5    Cervical / Trunk Assessment Cervical / Trunk Assessment: Kyphotic  Communication   Communication: HOH  Cognition Arousal/Alertness: Awake/alert Behavior During Therapy: WFL for tasks assessed/performed Overall Cognitive Status: History of cognitive impairments - at baseline                                         General Comments      Exercises General Exercises - Lower Extremity Long Arc Quad: AROM;Both;10 reps;Seated   Assessment/Plan    PT Assessment Patient needs continued PT services  PT Problem List Decreased activity tolerance;Decreased balance;Decreased mobility;Decreased knowledge of use of DME;Decreased safety awareness;Decreased knowledge of precautions;Pain       PT Treatment Interventions DME instruction;Gait training;Functional mobility training;Therapeutic activities;Therapeutic exercise;Balance training;Patient/family education    PT Goals (Current goals can be found in the Care Plan section)  Acute Rehab PT Goals Patient Stated Goal: unable to state PT Goal Formulation: With patient Time For Goal Achievement: 03/19/20 Potential to Achieve Goals: Good    Frequency Min 2X/week   Barriers to discharge Decreased caregiver support      Co-evaluation               AM-PAC PT "6 Clicks" Mobility  Outcome Measure Help needed turning from your back to your side while in a flat bed without using bedrails?: A Lot Help needed moving from lying on your back to sitting on the side of a flat bed without using bedrails?: A Lot Help needed moving to and from a bed to a chair (including a wheelchair)?: Total Help needed standing up from a chair using your arms (e.g., wheelchair or bedside chair)?: Total Help needed to walk in hospital room?: Total Help needed climbing 3-5 steps with a railing? : Total 6 Click Score: 8    End of Session Equipment Utilized During Treatment: Gait belt Activity Tolerance: Patient limited by fatigue Patient left: with call bell/phone within reach;in bed;with bed alarm set;with nursing/sitter in room Nurse Communication: Mobility status PT Visit Diagnosis: Muscle weakness (generalized) (M62.81);Pain Pain - Right/Left:  (bil) Pain - part of body: Ankle and joints of foot    Time: 1142-1155 PT Time Calculation (min) (ACUTE ONLY): 13  min   Charges:   PT Evaluation $PT Eval Moderate Complexity: 1 Mod          Octavie Westerhold W,PT Acute Rehabilitation Services Pager:  [phone]  Office:  [phone]    Berline Lopes 03/05/2020, 2:17 PM

## 2020-03-05 NOTE — Progress Notes (Signed)
   03/05/20 0403  Assess: MEWS Score  Temp 98.3 F (36.8 C)  BP 123/71  Pulse Rate (!) 113  ECG Heart Rate (!) 113  Resp (!) 23  SpO2 97 %  O2 Device Room Air  Assess: MEWS Score  MEWS Temp 0  MEWS Systolic 0  MEWS Pulse 2  MEWS RR 1  MEWS LOC 0  MEWS Score 3  MEWS Score Color Yellow  Assess: if the MEWS score is Yellow or Red  Were vital signs taken at a resting state? Yes  Focused Assessment Change from prior assessment (see assessment flowsheet)  Early Detection of Sepsis Score *See Row Information* Medium  MEWS guidelines implemented *See Row Information* Yes  Treat  MEWS Interventions Administered prn meds/treatments  Take Vital Signs  Increase Vital Sign Frequency  Yellow: Q 2hr X 2 then Q 4hr X 2, if remains yellow, continue Q 4hrs  Escalate  MEWS: Escalate Yellow: discuss with charge nurse/RN and consider discussing with provider and RRT  Notify: Charge Nurse/RN  Name of Charge Nurse/RN Notified Christina RN  Date Charge Nurse/RN Notified 03/05/20  Time Charge Nurse/RN Notified [phone]

## 2020-03-05 NOTE — Progress Notes (Signed)
PHARMACY - PHYSICIAN COMMUNICATION CRITICAL VALUE ALERT - BLOOD CULTURE IDENTIFICATION (BCID)  Christian Warren. is an 86 y.o. male who presented to [facility] on 03/03/2020 with a chief complaint of urosepsis  Assessment:  Blood cultures growing 1 of 2 anaerobic bottles GPR, no BCID will be done. Possible contaminant. Patient is on cefepime for likely urinary source, no changes recommended.  Name of physician (or Provider) Contacted: Dr. Antionette Char  Current antibiotics: Cefepime  Changes to prescribed antibiotics recommended:  Patient is on recommended antibiotics - No changes needed  No results found for this or any previous visit.  Alphia Moh, PharmD, BCPS, BCCP Clinical Pharmacist  Please check AMION for all [facility] phone numbers After 10:00 PM, call Main Pharmacy [phone]

## 2020-03-06 DIAGNOSIS — R338 Other retention of urine: Secondary | ICD-10-CM

## 2020-03-06 LAB — CBC WITH DIFFERENTIAL/PLATELET
Abs Immature Granulocytes: 0.23 10*3/uL — ABNORMAL HIGH (ref 0.00–0.07)
Basophils Absolute: 0 10*3/uL (ref 0.0–0.1)
Basophils Relative: 1 %
Eosinophils Absolute: 0.1 10*3/uL (ref 0.0–0.5)
Eosinophils Relative: 1 %
HCT: 30.6 % — ABNORMAL LOW (ref 39.0–52.0)
Hemoglobin: 10.2 g/dL — ABNORMAL LOW (ref 13.0–17.0)
Immature Granulocytes: 4 %
Lymphocytes Relative: 25 %
Lymphs Abs: 1.3 10*3/uL (ref 0.7–4.0)
MCH: 32.3 pg (ref 26.0–34.0)
MCHC: 33.3 g/dL (ref 30.0–36.0)
MCV: 96.8 fL (ref 80.0–100.0)
Monocytes Absolute: 1 10*3/uL (ref 0.1–1.0)
Monocytes Relative: 18 %
Neutro Abs: 2.8 10*3/uL (ref 1.7–7.7)
Neutrophils Relative %: 51 %
Platelets: 151 10*3/uL (ref 150–400)
RBC: 3.16 MIL/uL — ABNORMAL LOW (ref 4.22–5.81)
RDW: 13.9 % (ref 11.5–15.5)
WBC: 5.4 10*3/uL (ref 4.0–10.5)
nRBC: 0 % (ref 0.0–0.2)

## 2020-03-06 LAB — BASIC METABOLIC PANEL
Anion gap: 11 (ref 5–15)
BUN: 41 mg/dL — ABNORMAL HIGH (ref 8–23)
CO2: 22 mmol/L (ref 22–32)
Calcium: 8.3 mg/dL — ABNORMAL LOW (ref 8.9–10.3)
Chloride: 107 mmol/L (ref 98–111)
Creatinine, Ser: 2.33 mg/dL — ABNORMAL HIGH (ref 0.61–1.24)
GFR calc Af Amer: 29 mL/min — ABNORMAL LOW (ref >60)
GFR calc non Af Amer: 25 mL/min — ABNORMAL LOW (ref >60)
Glucose, Bld: 130 mg/dL — ABNORMAL HIGH (ref 70–99)
Potassium: 3.3 mmol/L — ABNORMAL LOW (ref 3.5–5.1)
Sodium: 140 mmol/L (ref 135–145)

## 2020-03-06 LAB — MAGNESIUM: Magnesium: 1.5 mg/dL — ABNORMAL LOW (ref 1.7–2.4)

## 2020-03-06 MED ORDER — POTASSIUM CHLORIDE CRYS ER 20 MEQ PO TBCR
40.0000 meq | EXTENDED_RELEASE_TABLET | Freq: Once | ORAL | Status: AC
  Administered 2020-03-06: 40 meq via ORAL
  Filled 2020-03-06: qty 2

## 2020-03-06 MED ORDER — MAGNESIUM SULFATE 2 GM/50ML IV SOLN
2.0000 g | Freq: Once | INTRAVENOUS | Status: AC
  Administered 2020-03-06: 2 g via INTRAVENOUS

## 2020-03-06 NOTE — Progress Notes (Signed)
SLP Cancellation Note  Patient Details Name: Christian Warren. MRN: [id number] DOB: [birth year]   Cancelled treatment:        Attempted therapy for possible upgrade from purees. Pt sleeping, opened eyes and closes. He repeatedly requests this therapist to "leave me alone" even after multiple attempts to encourage. Will continue efforts.    Royce Macadamia 03/06/2020, 11:10 AM   Breck Coons Lonell Face.Ed Nurse, children's [phone] Office [phone]

## 2020-03-06 NOTE — Progress Notes (Signed)
Patient ID: Christian Warren., male   DOB: 1932-07-14, 86 y.o.   MRN: [id number]  PROGRESS NOTE    Christian Warren.  ERD:[id number] DOB: 07-29-1932 DOA: 03/03/2020 PCP: Retia Passe, NP   Brief Narrative:  86 year old male with history of hypertension, BPH, OA, recent hospitalization for rhabdomyolysis and AKI with subsequent discharge to nursing home was found unresponsive at nursing home.  On presentation, initial blood pressure was 80/50 which improved to 116/60 after 200 cc normal saline bolus following which patient became more awake and responded to questions but was still confused.  He was found to have temperature of 101.6.  Chest x-ray showed no acute infiltrates.  UA was positive.  WBC of 16, creatinine 6.1, bicarb of 14.  Bladder scan was showing more than 700 cc of urine, Foley catheter was inserted.  Nephrology was curb sided who recommended aggressive hydration and repeat renal ultrasound and if no improvement in renal function, to consult nephrology officially.  Assessment & Plan:   Severe sepsis: Present on admission -Patient presented with hypotension, fever, leukocytosis with end-stage organ damage including AKI and encephalopathy -Probably from UTI/pyelonephritis -Antibiotic plan as below.  IV fluids as below.  Blood cultures growing gram-positive rods, anaerobic bottle only: Possibly contaminant.  COVID-19 testing was negative on presentation -Afebrile currently and blood pressure stable.  UTI/probable acute pyelonephritis -Continue cefepime.  Urine culture negative so far.  Received a dose of vancomycin on presentation.  AKI Non-anion gap metabolic acidosis -Probably post renal from BPH with concerns for obstructive uropathy -Foley catheter inserted in the ED and more than 700 cc fluid drained -Currently on bicarb drip.  Creatinine 6.11 on presentation.  3.29 on 03/05/2020.  Creatinine pending today.  Initial renal ultrasound showed mild bilateral hydronephrosis.  Nephrology  was curb sided by admitting provider: Nephrology had recommended to continue with aggressive IV fluid hydration with bicarb drip and repeat renal ultrasound in 24 hours.  If no improvement, to call nephrology officially.   -Repeat ultrasound showed persistent but improved mild bilateral hydronephrosis, left worse than right -DC'd bicarb drip on 03/05/2020.  Currently on normal saline at 75 cc an hour  Thrombocytopenia -Questionable cause.  No signs of bleeding.  Monitor  Hypokalemia -Labs pending today  Hypomagnesemia -Labs pending today  Acute metabolic encephalopathy -From all of the above.  Monitor mental status.  Patient was started on low-dose Seroquel on 03/05/2020 because of extreme agitation and lack of sleep.   -Fall precaution.  PT recommends SNF placement. -Ammonia, folate, B12 and TSH levels normal  Acute urinary retention -Probably from UTI and BPH.  Continue Flomax.  Continue indwelling Foley catheter for now.  Might need outpatient urology evaluation  Leukocytosis -Resolved.  Hypertension -Blood pressure improving.  Blood pressure medications on hold.  Generalized conditioning - will need PT evaluation once a little more stable. -Palliative care following.  CODE STATUS has been changed to DNR.  DVT prophylaxis: Heparin Code Status: DNR  family Communication: None at bedside Disposition Plan: Status is: Inpatient  Remains inpatient appropriate because:Inpatient level of care appropriate due to severity of illness   Dispo: The patient is from: SNF              Anticipated d/c is to: SNF              Anticipated d/c date is: 2 days              Patient currently is not medically stable to d/c.  Consultants: Nephrology  was curb sided by admitting provider  Procedures: None  Antimicrobials: Cefepime from 03/03/2020 onwards.  One dose of vancomycin and Flagyl on 03/03/2020   Subjective: Patient seen and examined at bedside.  Poor historian.  Awake, hardly  answers any questions.  No overnight fever, vomiting, worsening agitation reported by nursing staff.    Objective: Vitals:   03/05/20 1657 03/05/20 1943 03/06/20 0015 03/06/20 0522  BP:  138/86 117/68 126/86  Pulse: 80 83 97 92  Resp: 12 18 20 19   Temp: (!) 97.4 F (36.3 C) 97.6 F (36.4 C) 98 F (36.7 C) 99 F (37.2 C)  TempSrc: Oral Oral Axillary Oral  SpO2: 97% 95% 96% 95%  Weight:    84.7 kg  Height:        Intake/Output Summary (Last 24 hours) at 03/06/2020 0720 Last data filed at 03/05/2020 2000 Gross per 24 hour  Intake 858 ml  Output 1950 ml  Net -1092 ml   Filed Weights   03/03/20 2119 03/05/20 0411 03/06/20 0522  Weight: 88.1 kg 86.8 kg 84.7 kg    Examination:  General exam: No distress.  Looks chronically ill.  Elderly male lying in bed.  Looks more calm this morning. Respiratory system: Bilateral decreased breath sounds at bases with scattered crackles  cardiovascular system: Rate controlled, S1-S2 heard Gastrointestinal system: Abdomen is nondistended, soft and nontender.  Normal bowel sounds heard  extremities: No cyanosis.  Trace lower extremity edema present.   Central nervous system: Extremely poor historian.  Still confused.  Awake, but hardly answers any questions. No focal neurological deficits.  Moving extremities Skin: No obvious rashes/petechiae  psychiatry: Could not be assessed because of mental status  Data Reviewed: I have personally reviewed following labs and imaging studies  CBC: Recent Labs  Lab 03/03/20 1120 03/04/20 0331 03/05/20 0220  WBC 16.4* 13.6* 7.7  NEUTROABS 13.8*  --  4.8  HGB 11.3* 9.5* 10.6*  HCT 35.3* 28.6* 30.3*  MCV 98.6 95.3 94.4  PLT 150 112* 135*   Basic Metabolic Panel: Recent Labs  Lab 03/03/20 1120 03/03/20 1452 03/04/20 0331 03/05/20 0220  NA 136 137 140 135  K 3.9 4.4 3.3* 3.2*  CL 108 108 112* 101  CO2 13* 14* 16* 22  GLUCOSE 119* 130* 112* 117*  BUN 92* 87* 79* 59*  CREATININE 6.11* 5.42*  4.63* 3.29*  CALCIUM 9.0 8.5* 8.6* 8.4*  MG  --   --   --  1.2*   GFR: Estimated Creatinine Clearance: 18.3 mL/min (A) (by C-G formula based on SCr of 3.29 mg/dL (H)). Liver Function Tests: Recent Labs  Lab 03/03/20 1120 03/05/20 0220  AST 22 24  ALT 19 19  ALKPHOS 90 78  BILITOT 0.9 0.5  PROT 6.2* 5.6*  ALBUMIN 3.0* 2.6*   Recent Labs  Lab 03/03/20 1120  LIPASE 46   Recent Labs  Lab 03/05/20 0220  AMMONIA 24   Coagulation Profile: Recent Labs  Lab 03/03/20 1120  INR 1.3*   Cardiac Enzymes: Recent Labs  Lab 03/03/20 1120  CKTOTAL 34*   BNP (last 3 results) No results for input(s): PROBNP in the last 8760 hours. HbA1C: No results for input(s): HGBA1C in the last 72 hours. CBG: Recent Labs  Lab 03/03/20 1101  GLUCAP 119*   Lipid Profile: No results for input(s): CHOL, HDL, LDLCALC, TRIG, CHOLHDL, LDLDIRECT in the last 72 hours. Thyroid Function Tests: Recent Labs    03/05/20 0220  TSH 2.526   Anemia Panel: Recent Labs  03/05/20 0220  VITAMINB12 1,077*  FOLATE 8.5   Sepsis Labs: Recent Labs  Lab 03/03/20 1120 03/03/20 1452  LATICACIDVEN 1.7 1.2    Recent Results (from the past 240 hour(s))  Blood culture (routine x 2)     Status: None (Preliminary result)   Collection Time: 03/03/20 11:20 AM   Specimen: BLOOD  Result Value Ref Range Status   Specimen Description BLOOD RIGHT ANTECUBITAL  Final   Special Requests   Final    BOTTLES DRAWN AEROBIC AND ANAEROBIC Blood Culture results may not be optimal due to an inadequate volume of blood received in culture bottles   Culture  Setup Time   Final    GRAM POSITIVE RODS ANAEROBIC BOTTLE ONLY CRITICAL RESULT CALLED TO, READ BACK BY AND VERIFIED WITH: Carmell AustriaL. CHEN,PHARMD [id number] 03/05/2020 Girtha Hake. TYSOR Performed at [facility], [address] [place], [place] [address]    Culture GRAM POSITIVE RODS  Final   Report Status PENDING  Incomplete  Urine culture     Status: None   Collection Time:  03/03/20  1:49 PM   Specimen: Urine, Random  Result Value Ref Range Status   Specimen Description URINE, RANDOM  Final   Special Requests NONE  Final   Culture   Final    NO GROWTH Performed at [facility], [address] [place], [place] [address]    Report Status 03/04/2020 FINAL  Final  Respiratory Panel by RT PCR (Flu A&B, Covid) - Nasopharyngeal Swab     Status: None   Collection Time: 03/03/20  2:30 PM   Specimen: Nasopharyngeal Swab  Result Value Ref Range Status   SARS Coronavirus 2 by RT PCR NEGATIVE NEGATIVE Final    Comment: (NOTE) SARS-CoV-2 target nucleic acids are NOT DETECTED.  The SARS-CoV-2 RNA is generally detectable in upper respiratoy specimens during the acute phase of infection. The lowest concentration of SARS-CoV-2 viral copies this assay can detect is 131 copies/mL. A negative result does not preclude SARS-Cov-2 infection and should not be used as the sole basis for treatment or other patient management decisions. A negative result may occur with  improper specimen collection/handling, submission of specimen other than nasopharyngeal swab, presence of viral mutation(s) within the areas targeted by this assay, and inadequate number of viral copies (<131 copies/mL). A negative result must be combined with clinical observations, patient history, and epidemiological information. The expected result is Negative.  Fact Sheet for Patients:  https://www.moore.com/https://www.fda.gov/media/142436/download  Fact Sheet for Healthcare Providers:  https://www.young.biz/https://www.fda.gov/media/142435/download  This test is no t yet approved or cleared by the [place] FDA and  has been authorized for detection and/or diagnosis of SARS-CoV-2 by FDA under an Emergency Use Authorization (EUA). This EUA will remain  in effect (meaning this test can be used) for the duration of the COVID-19 declaration under Section 564(b)(1) of the Act, 21 U.S.C. section 360bbb-3(b)(1), unless the authorization is  terminated or revoked sooner.     Influenza A by PCR NEGATIVE NEGATIVE Final   Influenza B by PCR NEGATIVE NEGATIVE Final    Comment: (NOTE) The Xpert Xpress SARS-CoV-2/FLU/RSV assay is intended as an aid in  the diagnosis of influenza from Nasopharyngeal swab specimens and  should not be used as a sole basis for treatment. Nasal washings and  aspirates are unacceptable for Xpert Xpress SARS-CoV-2/FLU/RSV  testing.  Fact Sheet for Patients: https://www.moore.com/https://www.fda.gov/media/142436/download  Fact Sheet for Healthcare Providers: https://www.young.biz/https://www.fda.gov/media/142435/download  This test is not yet approved or cleared by the [place] FDA and  has been authorized for detection and/or diagnosis of SARS-CoV-2 by  FDA under an Emergency Use Authorization (EUA). This EUA will remain  in effect (meaning this test can be used) for the duration of the  Covid-19 declaration under Section 564(b)(1) of the Act, 21  U.S.C. section 360bbb-3(b)(1), unless the authorization is  terminated or revoked. Performed at [facility], [address], [place], [place] [address]   Blood culture (routine x 2)     Status: None (Preliminary result)   Collection Time: 03/03/20 10:04 PM   Specimen: BLOOD  Result Value Ref Range Status   Specimen Description BLOOD RIGHT THUMB  Final   Special Requests   Final    BOTTLES DRAWN AEROBIC ONLY Blood Culture results may not be optimal due to an inadequate volume of blood received in culture bottles   Culture   Final    NO GROWTH 3 DAYS Performed at [facility], [address], [place], [place] [address]    Report Status PENDING  Incomplete         Radiology Studies: US RENAL  Result Date: 03/04/2020 CLINICAL DATA:  Acute kidney injury EXAM: RENAL / URINARY TRACT ULTRASOUND COMPLETE COMPARISON:  03/03/2020 FINDINGS: Right Kidney: Renal measurements: 10 x 4.1 x 4.6 cm = volume: 98.6 mL. Again noted is mild hydronephrosis. This appears improved from prior  study. Left Kidney: Renal measurements: 11.7 x 4.9 x 5 cm = volume: 147 mL. Again noted is mild hydronephrosis that appears slightly improved from prior study. Bladder: The bladder is decompressed with a Foley catheter. Despite the degree of underdistention, the bladder wall appears to be thickened which may be secondary to chronic outlet obstruction. Other: The prostate gland is significantly enlarged measuring 7 x 4.8 cm. IMPRESSION: 1. Persistent but improved mild bilateral hydronephrosis, left worse than right. 2. Urinary bladder decompressed by Foley catheter. There appears to be diffuse bladder wall thickening despite the degree of underdistention which can be seen in patients with chronic outlet obstruction. 3. Prostatomegaly. Electronically Signed   By: Katherine Mantle M.D.   On: 03/04/2020 17:39        Scheduled Meds: . aspirin EC  81 mg Oral Daily  . Chlorhexidine Gluconate Cloth  6 each Topical Daily  . heparin  5,000 Units Subcutaneous Q12H  . pantoprazole  40 mg Oral Daily  . QUEtiapine  12.5 mg Oral QHS  . tamsulosin  0.4 mg Oral Daily   Continuous Infusions: . sodium chloride 75 mL/hr at 03/06/20 0519  . ceFEPime (MAXIPIME) IV 2 g (03/05/20 1759)          Glade Lloyd, MD [facility] 03/06/2020, 7:20 AM

## 2020-03-06 NOTE — Progress Notes (Signed)
Pt spat out all his po meds except for pottassium.  I was able to give dissolved potasium with purred fruits.  Hinton Dyer, RN

## 2020-03-07 DIAGNOSIS — I959 Hypotension, unspecified: Secondary | ICD-10-CM

## 2020-03-07 LAB — CBC WITH DIFFERENTIAL/PLATELET
Abs Immature Granulocytes: 0.35 10*3/uL — ABNORMAL HIGH (ref 0.00–0.07)
Basophils Absolute: 0.1 10*3/uL (ref 0.0–0.1)
Basophils Relative: 1 %
Eosinophils Absolute: 0.2 10*3/uL (ref 0.0–0.5)
Eosinophils Relative: 2 %
HCT: 36.5 % — ABNORMAL LOW (ref 39.0–52.0)
Hemoglobin: 11.9 g/dL — ABNORMAL LOW (ref 13.0–17.0)
Immature Granulocytes: 4 %
Lymphocytes Relative: 18 %
Lymphs Abs: 1.4 10*3/uL (ref 0.7–4.0)
MCH: 32.2 pg (ref 26.0–34.0)
MCHC: 32.6 g/dL (ref 30.0–36.0)
MCV: 98.6 fL (ref 80.0–100.0)
Monocytes Absolute: 1.2 10*3/uL — ABNORMAL HIGH (ref 0.1–1.0)
Monocytes Relative: 15 %
Neutro Abs: 4.8 10*3/uL (ref 1.7–7.7)
Neutrophils Relative %: 60 %
Platelets: 186 10*3/uL (ref 150–400)
RBC: 3.7 MIL/uL — ABNORMAL LOW (ref 4.22–5.81)
RDW: 14.1 % (ref 11.5–15.5)
WBC: 7.9 10*3/uL (ref 4.0–10.5)
nRBC: 0 % (ref 0.0–0.2)

## 2020-03-07 LAB — BASIC METABOLIC PANEL
Anion gap: 10 (ref 5–15)
BUN: 41 mg/dL — ABNORMAL HIGH (ref 8–23)
CO2: 23 mmol/L (ref 22–32)
Calcium: 8.9 mg/dL (ref 8.9–10.3)
Chloride: 107 mmol/L (ref 98–111)
Creatinine, Ser: 2.36 mg/dL — ABNORMAL HIGH (ref 0.61–1.24)
GFR calc Af Amer: 28 mL/min — ABNORMAL LOW (ref >60)
GFR calc non Af Amer: 24 mL/min — ABNORMAL LOW (ref >60)
Glucose, Bld: 109 mg/dL — ABNORMAL HIGH (ref 70–99)
Potassium: 3.9 mmol/L (ref 3.5–5.1)
Sodium: 140 mmol/L (ref 135–145)

## 2020-03-07 LAB — MAGNESIUM: Magnesium: 2.1 mg/dL (ref 1.7–2.4)

## 2020-03-07 NOTE — Progress Notes (Signed)
SLP Cancellation Note  Patient Details Name: Christian Warren. MRN: [id number] DOB: 03-Sep-1932   Cancelled treatment:       Reason Eval/Treat Not Completed: Patient at procedure or test/unavailable (Pt receiving care from NT. SLP will follow up as schedule allows.)  Haleemah Buckalew I. Vear Clock, MS, CCC-SLP Acute Rehabilitation Services Office number [phone] Pager [phone]  Scheryl Marten 03/07/2020, 10:31 AM

## 2020-03-07 NOTE — Progress Notes (Addendum)
Patient has had no urine output 6hrs post foley removal. Bladder scan shows >944ml. Paged [facility], got orders for in and out cath. Will continue to monitor patient.   2200 Update: out from in & out cath  0430 update: patient bladder scanned, showed .urine. per protocol, in &out cath-1200 output.

## 2020-03-07 NOTE — Progress Notes (Signed)
Patient ID: Christian Warren., male   DOB: [birth year], 86 y.o.   MRN: [id number]  PROGRESS NOTE    Christian Warren.  AJO:[id number] DOB: [birth year] DOA: 03/03/2020 PCP: Retia Passe, NP    Subjective: The patient was seen and examined this morning, hemodynamically stable, remains confused, Per nursing staff no issues overnight.        Brief Narrative:  86 year old male with history of hypertension, BPH, OA, recent hospitalization for rhabdomyolysis and AKI with subsequent discharge to nursing home was found unresponsive at nursing home.  On presentation, initial blood pressure was 80/50 which improved to 116/60 after 200 cc normal saline bolus following which patient became more awake and responded to questions but was still confused.  He was found to have temperature of 101.6.  Chest x-ray showed no acute infiltrates.  UA was positive.  WBC of 16, creatinine 6.1, bicarb of 14.  Bladder scan was showing more than 700 cc of urine, Foley catheter was inserted.  Nephrology was curb sided who recommended aggressive hydration and repeat renal ultrasound and if no improvement in renal function, to consult nephrology officially.  Assessment & Plan:   Severe sepsis: Present on admission -Improving afebrile normotensive... Mild tachycardic heart rate of 96, tachypneic respirate of 20, no changes in BUN/creatinine   -Patient presented with hypotension, fever, leukocytosis with end-stage organ damage including AKI and encephalopathy -Probably from UTI/pyelonephritis --We will continue broad-spectrum antibiotics -cefepime -  Blood cultures growing 1/2  gram-positive rods, anaerobic bottle only: Possibly contaminant. -Repeating blood cultures -  COVID-19 testing was negative on presentation   UTI/probable acute pyelonephritis -Pending final cultures no growth to date -Urine culture -Continue cefepime.  Urine culture negative so far.  Received a dose of vancomycin on  presentation.  AKI Non-anion gap metabolic acidosis -Probably post renal from BPH with concerns for obstructive uropathy -Foley catheter inserted in the ED and more than 700 cc fluid drained -Currently on bicarb drip.  Creatinine 6.11 on presentation.  3.29 >>>  2.36  -Initial renal ultrasound showed mild bilateral hydronephrosis.   - Nephrology was curb sided by admitting provider: Nephrology had recommended to continue with aggressive IV fluid hydration with bicarb drip and repeat renal ultrasound in 24 hours.  If no improvement, to call nephrology officially.   -Repeat ultrasound showed persistent but improved mild bilateral hydronephrosis, left worse than right -DC'd bicarb drip on 03/05/2020.  Currently on normal saline at 75 cc an hour  Thrombocytopenia -Likely cause due to sepsis, -No signs of bleeding -Platelets 135, 151, 186 today  Hypokalemia -Monitor electrolytes, potassium 3.2, 3.3, 3.9 today  Hypomagnesemia -Monitoring and repleting accordingly  Acute metabolic encephalopathy -Improving,. -  Monitor mental status.  Patient was started on low-dose Seroquel on 03/05/2020 because of extreme agitation and lack of sleep.   -Fall precaution.  PT recommends SNF placement. -Ammonia, folate, B12 and TSH levels all normal  Acute urinary retention -Probably from UTI and BPH.   -We will continue Flomax, monitor I's and O's - continue indwelling Foley catheter for now.   Might need outpatient urology evaluation    Hypertension -Was hypotensive state due to sepsis, blood pressure improving, stable - blood pressure medications on hold.  Generalized conditioning - will need PT evaluation once a little more stable. -Palliative care following.  CODE STATUS has been changed to DNR.  DVT prophylaxis: Heparin Code Status: DNR  family Communication: None at bedside Disposition Plan: Status is: Inpatient  Remains inpatient appropriate because:Inpatient level of care  appropriate  due to severity of illness   Dispo: The patient is from: SNF              Anticipated d/c is to: SNF              Anticipated d/c date is: 2 days              Patient currently is not medically stable to d/c.  Consultants: Nephrology was curb sided by admitting provider  Procedures: None  Antimicrobials: Cefepime from 03/03/2020 onwards.  One dose of vancomycin and Flagyl on 03/03/2020   Objective: Vitals:   03/06/20 2100 03/07/20 0030 03/07/20 0628 03/07/20 0906  BP: (!) 133/99 (!) 133/98 (!) 132/94 116/86  Pulse: 87 87 82 96  Resp: 18 18 20 20   Temp: 97.6 F (36.4 C) 97.6 F (36.4 C) 97.6 F (36.4 C)   TempSrc: Axillary Axillary Oral   SpO2: 96% 94% 96% 98%  Weight:   86.2 kg   Height:        Intake/Output Summary (Last 24 hours) at 03/07/2020 1200 Last data filed at 03/07/2020 0600 Gross per 24 hour  Intake 1350 ml  Output 550 ml  Net 800 ml   Filed Weights   03/05/20 0411 03/06/20 0522 03/07/20 0628  Weight: 86.8 kg 84.7 kg 86.2 kg       Physical Exam:   General:   Awake, confused  HEENT:  Normocephalic, PERRL, otherwise with in Normal limits   Neuro:  CNII-XII intact. , normal motor and sensation, reflexes intact   Lungs:   Clear to auscultation BL, Respirations unlabored, no wheezes / crackles  Cardio:    S1/S2, RRR, No murmure, No Rubs or Gallops   Abdomen:   Soft, non-tender, bowel sounds active all four quadrants,  no guarding or peritoneal signs.  Muscular skeletal:   Generalized weaknesses, Limited exam - in bed, able to move all 4 extremities, Normal strength,  2+ pulses,  symmetric, No pitting edema  Skin:  Dry, warm to touch, negative for any Rashes, No open wounds  Wounds: Please see nursing documentation            Data Reviewed: I have personally reviewed following labs and imaging studies  CBC: Recent Labs  Lab 03/03/20 1120 03/04/20 0331 03/05/20 0220 03/06/20 1015 03/07/20 0635  WBC 16.4* 13.6* 7.7 5.4 7.9  NEUTROABS 13.8*   --  4.8 2.8 4.8  HGB 11.3* 9.5* 10.6* 10.2* 11.9*  HCT 35.3* 28.6* 30.3* 30.6* 36.5*  MCV 98.6 95.3 94.4 96.8 98.6  PLT 150 112* 135* 151 186   Basic Metabolic Panel: Recent Labs  Lab 03/03/20 1452 03/04/20 0331 03/05/20 0220 03/06/20 1015 03/07/20 0635  NA 137 140 135 140 140  K 4.4 3.3* 3.2* 3.3* 3.9  CL 108 112* 101 107 107  CO2 14* 16* 22 22 23   GLUCOSE 130* 112* 117* 130* 109*  BUN 87* 79* 59* 41* 41*  CREATININE 5.42* 4.63* 3.29* 2.33* 2.36*  CALCIUM 8.5* 8.6* 8.4* 8.3* 8.9  MG  --   --  1.2* 1.5* 2.1   GFR: Estimated Creatinine Clearance: 25.6 mL/min (A) (by C-G formula based on SCr of 2.36 mg/dL (H)). Liver Function Tests: Recent Labs  Lab 03/03/20 1120 03/05/20 0220  AST 22 24  ALT 19 19  ALKPHOS 90 78  BILITOT 0.9 0.5  PROT 6.2* 5.6*  ALBUMIN 3.0* 2.6*   Recent Labs  Lab 03/03/20 1120  LIPASE 46   Recent Labs  Lab 03/05/20 0220  AMMONIA 24   Coagulation Profile: Recent Labs  Lab 03/03/20 1120  INR 1.3*   Cardiac Enzymes: Recent Labs  Lab 03/03/20 1120  CKTOTAL 34*   BNP (last 3 results) No results for input(s): PROBNP in the last 8760 hours. HbA1C: No results for input(s): HGBA1C in the last 72 hours. CBG: Recent Labs  Lab 03/03/20 1101  GLUCAP 119*   Lipid Profile: No results for input(s): CHOL, HDL, LDLCALC, TRIG, CHOLHDL, LDLDIRECT in the last 72 hours. Thyroid Function Tests: Recent Labs    03/05/20 0220  TSH 2.526   Anemia Panel: Recent Labs    03/05/20 0220  VITAMINB12 1,077*  FOLATE 8.5   Sepsis Labs: Recent Labs  Lab 03/03/20 1120 03/03/20 1452  LATICACIDVEN 1.7 1.2    Recent Results (from the past 240 hour(s))  Blood culture (routine x 2)     Status: None (Preliminary result)   Collection Time: 03/03/20 11:20 AM   Specimen: BLOOD  Result Value Ref Range Status   Specimen Description BLOOD RIGHT ANTECUBITAL  Final   Special Requests   Final    BOTTLES DRAWN AEROBIC AND ANAEROBIC Blood Culture results  may not be optimal due to an inadequate volume of blood received in culture bottles   Culture  Setup Time   Final    GRAM POSITIVE RODS ANAEROBIC BOTTLE ONLY CRITICAL RESULT CALLED TO, READ BACK BY AND VERIFIED WITH: Carmell Austria [id number] 03/05/2020 Girtha Hake Performed at [facility], [address], [place], [place] [address]    Culture GRAM POSITIVE RODS  Final   Report Status PENDING  Incomplete  Urine culture     Status: None   Collection Time: 03/03/20  1:49 PM   Specimen: Urine, Random  Result Value Ref Range Status   Specimen Description URINE, RANDOM  Final   Special Requests NONE  Final   Culture   Final    NO GROWTH Performed at [facility], [address], [place], [place] [address]    Report Status 03/04/2020 FINAL  Final  Respiratory Panel by RT PCR (Flu A&B, Covid) - Nasopharyngeal Swab     Status: None   Collection Time: 03/03/20  2:30 PM   Specimen: Nasopharyngeal Swab  Result Value Ref Range Status   SARS Coronavirus 2 by RT PCR NEGATIVE NEGATIVE Final    Comment: (NOTE) SARS-CoV-2 target nucleic acids are NOT DETECTED.  The SARS-CoV-2 RNA is generally detectable in upper respiratoy specimens during the acute phase of infection. The lowest concentration of SARS-CoV-2 viral copies this assay can detect is 131 copies/mL. A negative result does not preclude SARS-Cov-2 infection and should not be used as the sole basis for treatment or other patient management decisions. A negative result may occur with  improper specimen collection/handling, submission of specimen other than nasopharyngeal swab, presence of viral mutation(s) within the areas targeted by this assay, and inadequate number of viral copies (<131 copies/mL). A negative result must be combined with clinical observations, patient history, and epidemiological information. The expected result is Negative.  Fact Sheet for Patients:  https://www.moore.com/  Fact Sheet for  Healthcare Providers:  https://www.young.biz/  This test is no t yet approved or cleared by the [place] FDA and  has been authorized for detection and/or diagnosis of SARS-CoV-2 by FDA under an Emergency Use Authorization (EUA). This EUA will remain  in effect (meaning this test can be used) for the duration of the COVID-19 declaration under Section 564(b)(1)  of the Act, 21 U.S.C. section 360bbb-3(b)(1), unless the authorization is terminated or revoked sooner.     Influenza A by PCR NEGATIVE NEGATIVE Final   Influenza B by PCR NEGATIVE NEGATIVE Final    Comment: (NOTE) The Xpert Xpress SARS-CoV-2/FLU/RSV assay is intended as an aid in  the diagnosis of influenza from Nasopharyngeal swab specimens and  should not be used as a sole basis for treatment. Nasal washings and  aspirates are unacceptable for Xpert Xpress SARS-CoV-2/FLU/RSV  testing.  Fact Sheet for Patients: https://www.moore.com/  Fact Sheet for Healthcare Providers: https://www.young.biz/  This test is not yet approved or cleared by the [place] FDA and  has been authorized for detection and/or diagnosis of SARS-CoV-2 by  FDA under an Emergency Use Authorization (EUA). This EUA will remain  in effect (meaning this test can be used) for the duration of the  Covid-19 declaration under Section 564(b)(1) of the Act, 21  U.S.C. section 360bbb-3(b)(1), unless the authorization is  terminated or revoked. Performed at [facility], [address] [place], [place] [address]   Blood culture (routine x 2)     Status: None (Preliminary result)   Collection Time: 03/03/20 10:04 PM   Specimen: BLOOD  Result Value Ref Range Status   Specimen Description BLOOD RIGHT THUMB  Final   Special Requests   Final    BOTTLES DRAWN AEROBIC ONLY Blood Culture results may not be optimal due to an inadequate volume of blood received in culture bottles   Culture    Final    NO GROWTH 4 DAYS Performed at [facility], [address], [place], [place] [address]    Report Status PENDING  Incomplete         Radiology Studies: No results found.      Scheduled Meds: . aspirin EC  81 mg Oral Daily  . Chlorhexidine Gluconate Cloth  6 each Topical Daily  . heparin  5,000 Units Subcutaneous Q12H  . pantoprazole  40 mg Oral Daily  . QUEtiapine  12.5 mg Oral QHS  . tamsulosin  0.4 mg Oral Daily   Continuous Infusions: . sodium chloride 75 mL/hr at 03/06/20 2131  . ceFEPime (MAXIPIME) IV 2 g (03/07/20 1130)          Kendell Bane, MD [facility] 03/07/2020, 12:00 PM

## 2020-03-07 NOTE — Progress Notes (Signed)
Pharmacy Antibiotic Note  Kayvon Mo. is a 86 y.o. male admitted on 03/03/2020 with sepsis. The source is thought to be UTI. Pt has BCx with 1 of 2 growing GPRs, likely contaminant. Pt remains on cefepime. Cr improved from admit but remains above baseline.   Height: 6' (182.9 cm) Weight: 86.2 kg (190 lb 0.6 oz) IBW/kg (Calculated) : 77.6  Temp (24hrs), Avg:97.9 F (36.6 C), Min:97.6 F (36.4 C), Max:98.7 F (37.1 C)  Recent Labs  Lab 03/03/20 1120 03/03/20 1120 03/03/20 1452 03/04/20 0331 03/05/20 0220 03/06/20 1015 03/07/20 0635  WBC 16.4*  --   --  13.6* 7.7 5.4 7.9  CREATININE 6.11*   < > 5.42* 4.63* 3.29* 2.33* 2.36*  LATICACIDVEN 1.7  --  1.2  --   --   --   --    < > = values in this interval not displayed.    Estimated Creatinine Clearance: 25.6 mL/min (A) (by C-G formula based on SCr of 2.36 mg/dL (H)).    Allergies  Allergen Reactions  . Orange Juice [Orange Oil] Itching    Antimicrobials this admission: 9/25 Cefepime >>  9/25 Vancomycin x1 dose  Dose adjustments this admission: Cefepime initially dosed at 1g q24h, however, due to patient's improvement in renal function, dose was increased to 2g q24h per antimicrobial dosing guidelines.  Microbiology results: 9/25 BCx NGTD 9/25 BCx sent 9/25 UCx sent   Plan:  - Continue cefepime IV 2g q24h  - Monitor renal function, urine output, clinical status and length of therapy - Deescalate antibiotics and length of therapy when appropriate   Fredonia Highland, PharmD, BCPS Clinical Pharmacist [phone] Please check AMION for all [facility] numbers 03/07/2020

## 2020-03-07 NOTE — Progress Notes (Signed)
  Speech Language Pathology Treatment: Dysphagia  Patient Details Name: Christian Warren. MRN: [id number] DOB: April 12, 1933 Today's Date: 03/07/2020 Time: 1044-     Assessment / Plan / Recommendation Clinical Impression  Pt was seen for dysphagia treatment and was cooperative during the session. He was alert and cooperative during the session, but was noted to repeatedly attempt to place things from his empty hand into his mouth. Pt tolerated all solids without overt s/sx of aspiration. Mastication was prolonged with regular texture solids but functional with dysphagia 2 solids. No significant oral residue was noted. Pt was resistant to being seated fully upright and exhibited a single instance of throat clearing and subsequent instance of coughing with thin liquids via straw when he was partially reclined. However, no s/sx of aspiration were noted with optimal positioning. It is recommended that his diet be advanced to dysphagia 2 solids with thin liquids and SLP will continue to follow pt.    HPI HPI:  Christian Warren. is a 86 y.o. male with medical history significant of HTN, BPH, OA, was found unresponsive at nursing home.  Patient with recently hospitalized for rhabdomyolysis and AKI and then discharged back to nursing home.  Head CT was negative for acute intracranial abnormality.  CXR was remarkable for "linear bibasilar opacities, favoring atelectasis."      SLP Plan  Continue with current plan of care       Recommendations  Diet recommendations: Dysphagia 2 (fine chop);Thin liquid Liquids provided via: Cup;Straw Medication Administration: Crushed with puree Supervision: Staff to assist with self feeding Compensations: Minimize environmental distractions;Slow rate;Small sips/bites;Lingual sweep for clearance of pocketing                Oral Care Recommendations: Oral care BID Follow up Recommendations: Other (comment) (Defer to PT recommendations ) SLP Visit Diagnosis: Dysphagia,  oral phase (R13.11) Plan: Continue with current plan of care       Evona Westra I. Vear Clock, MS, CCC-SLP Acute Rehabilitation Services Office number [phone] Pager [phone]                Scheryl Marten 03/07/2020, 11:01 AM

## 2020-03-08 LAB — URINALYSIS, ROUTINE W REFLEX MICROSCOPIC
Bilirubin Urine: NEGATIVE
Glucose, UA: NEGATIVE mg/dL
Ketones, ur: NEGATIVE mg/dL
Nitrite: NEGATIVE
Protein, ur: NEGATIVE mg/dL
Specific Gravity, Urine: 1.008 (ref 1.005–1.030)
pH: 6 (ref 5.0–8.0)

## 2020-03-08 LAB — BASIC METABOLIC PANEL
Anion gap: 12 (ref 5–15)
BUN: 43 mg/dL — ABNORMAL HIGH (ref 8–23)
CO2: 18 mmol/L — ABNORMAL LOW (ref 22–32)
Calcium: 8.7 mg/dL — ABNORMAL LOW (ref 8.9–10.3)
Chloride: 107 mmol/L (ref 98–111)
Creatinine, Ser: 2.43 mg/dL — ABNORMAL HIGH (ref 0.61–1.24)
GFR calc Af Amer: 27 mL/min — ABNORMAL LOW (ref >60)
GFR calc non Af Amer: 24 mL/min — ABNORMAL LOW (ref >60)
Glucose, Bld: 88 mg/dL (ref 70–99)
Potassium: 3.9 mmol/L (ref 3.5–5.1)
Sodium: 137 mmol/L (ref 135–145)

## 2020-03-08 LAB — CBC
HCT: 31.4 % — ABNORMAL LOW (ref 39.0–52.0)
Hemoglobin: 10.1 g/dL — ABNORMAL LOW (ref 13.0–17.0)
MCH: 31.8 pg (ref 26.0–34.0)
MCHC: 32.2 g/dL (ref 30.0–36.0)
MCV: 98.7 fL (ref 80.0–100.0)
Platelets: 146 10*3/uL — ABNORMAL LOW (ref 150–400)
RBC: 3.18 MIL/uL — ABNORMAL LOW (ref 4.22–5.81)
RDW: 14.1 % (ref 11.5–15.5)
WBC: 6.8 10*3/uL (ref 4.0–10.5)
nRBC: 0 % (ref 0.0–0.2)

## 2020-03-08 LAB — CULTURE, BLOOD (ROUTINE X 2): Culture: NO GROWTH

## 2020-03-08 MED ORDER — CEPHALEXIN 500 MG PO CAPS: 500.0000 mg | ORAL_CAPSULE | Freq: Two times a day (BID) | ORAL | Status: DC

## 2020-03-08 MED ORDER — FINASTERIDE 5 MG PO TABS
5.0000 mg | ORAL_TABLET | Freq: Every day | ORAL | Status: DC
  Administered 2020-03-08 – 2020-03-11 (×4): 5 mg via ORAL
  Filled 2020-03-08 (×4): qty 1

## 2020-03-08 MED ORDER — ASPIRIN 81 MG PO CHEW
81.0000 mg | CHEWABLE_TABLET | Freq: Every day | ORAL | Status: DC
  Administered 2020-03-09 – 2020-03-11 (×3): 81 mg via ORAL
  Filled 2020-03-08 (×3): qty 1

## 2020-03-08 MED ORDER — CEPHALEXIN 500 MG PO CAPS
500.0000 mg | ORAL_CAPSULE | Freq: Two times a day (BID) | ORAL | Status: DC
  Administered 2020-03-08 – 2020-03-09 (×2): 500 mg via ORAL
  Filled 2020-03-08 (×2): qty 1

## 2020-03-08 MED ORDER — CIPROFLOXACIN HCL 500 MG PO TABS: 500.0000 mg | ORAL_TABLET | Freq: Two times a day (BID) | ORAL | Status: DC

## 2020-03-08 MED ORDER — CEPHALEXIN 250 MG/5ML PO SUSR
500.0000 mg | Freq: Two times a day (BID) | ORAL | Status: DC
  Filled 2020-03-08 (×2): qty 10

## 2020-03-08 NOTE — Progress Notes (Signed)
SLP Cancellation Note  Patient Details Name: Christian Warren. MRN: [id number] DOB: 08-03-1932   Cancelled treatment:       Reason Eval/Treat Not Completed: Patient at procedure or test/unavailable (Pt with physical therapy at this time. SLP will f/u)  Yvone Neu I. Vear Clock, MS, CCC-SLP Acute Rehabilitation Services Office number [phone] Pager [phone]  Scheryl Marten 03/08/2020, 1:33 PM

## 2020-03-08 NOTE — Progress Notes (Signed)
Patient ID: Christian Gentile., male   DOB: 10/11/1932, 86 y.o.   MRN: [id number]  PROGRESS NOTE    Christian Gentile.  YSA:[id number] DOB: 1932-11-24 DOA: 03/03/2020 PCP: Retia Passe, NP    Subjective: The patient was seen and examined, very hard of hearing, awake alert comfortable Complaining mildly of dysuria Nursing staff reporting bladder scan of 2 greater than 700 at times, patient had to be in and out .Marland Kitchen Some agitation with refusing to take medication Otherwise stable      Brief Narrative:  86 year old male with history of hypertension, BPH, OA, recent hospitalization for rhabdomyolysis and AKI with subsequent discharge to nursing home was found unresponsive at nursing home.  On presentation, initial blood pressure was 80/50 which improved to 116/60 after 200 cc normal saline bolus following which patient became more awake and responded to questions but was still confused.  He was found to have temperature of 101.6.  Chest x-ray showed no acute infiltrates.  UA was positive.  WBC of 16, creatinine 6.1, bicarb of 14.  Bladder scan was showing more than 700 cc of urine, Foley catheter was inserted.  Nephrology was curb sided who recommended aggressive hydration and repeat renal ultrasound and if no improvement in renal function, to consult nephrology officially.  Assessment & Plan:   Severe sepsis: Present on admission -Improving, hemodynamically stable -Resolved sepsis etiology     On admission -Patient presented with hypotension, fever, leukocytosis with end-stage organ damage including AKI and encephalopathy -Probably from UTI/pyelonephritis --We will continue broad-spectrum antibiotics -cefepime -  Blood cultures growing 1/2  gram-positive rods, anaerobic bottle only: Possibly contaminant. -Repeating blood cultures -  COVID-19 testing was negative on presentation   UTI/probable acute pyelonephritis /urinary retention -Continue to complain of urinary retention, every 6 hours  bladder scan, and an hour, Instructed nursing staff on next scan if greater than 500 urine residual, Foley catheter may be placed -Urine culture >> no growth to date -Continue cefepime x5 days today 03/09/2019 received vancomycin on admission Switching to p.o. ciprofloxacin for possible 5 more days -Rechecking urine with reflex   AKI Non-anion gap metabolic acidosis -Probably post renal from BPH with concerns for obstructive uropathy -Patient was in and out in ED greater than 700 urine was obtained -Of bicarb drip.  Creatinine 6.11 on presentation.  3.29 >>>  2.36 >> 2.43 -Initial renal ultrasound showed mild bilateral hydronephrosis.   - Nephrology was curb sided by admitting provider: Nephrology had recommended to continue with aggressive IV fluid hydration with bicarb drip and repeat renal ultrasound in 24 hours.  If no improvement, to call nephrology officially.   -Repeat ultrasound showed persistent but improved mild bilateral hydronephrosis, left worse than right -DC'd bicarb drip on 03/05/2020.  Currently on normal saline at 75 cc an hour  Thrombocytopenia -Likely cause due to sepsis, -No signs of bleeding -Platelets 135, 151, 186 today  Hypokalemia -Monitor electrolytes, potassium 3.2, 3.3, 3.9  today  Hypomagnesemia -Monitoring and repleting accordingly  Acute metabolic encephalopathy -Improving,. -  Monitor mental status.  Patient was started on low-dose Seroquel on 03/05/2020 because of extreme agitation and lack of sleep.   -Fall precaution.  PT recommends SNF placement. -Ammonia, folate, B12 and TSH levels all normal  Hypertension -Was hypotensive state due to sepsis, blood pressure improving, stable BP meds on hold   Generalized conditioning - will need PT evaluation once a little more stable. -Palliative care following.  CODE STATUS has been changed to DNR.   DVT  prophylaxis: Heparin Code Status: DNR  family Communication: None at bedside Disposition  Plan: Status is: Inpatient  Remains inpatient appropriate because:Inpatient level of care appropriate due to severity of illness   Dispo: The patient is from: SNF              Anticipated d/c is to: SNF              Anticipated d/c date is: 2 days              Patient currently is not medically stable to d/c.  Consultants: Nephrology was curb sided by admitting provider  Procedures: None  Antimicrobials: Cefepime from 03/03/2020 onwards.  One dose of vancomycin and Flagyl on 03/03/2020   Objective: Vitals:   03/07/20 1249 03/07/20 2017 03/08/20 0454 03/08/20 0807  BP: (!) 122/94 (!) 144/82 135/89 128/81  Pulse: 95 (!) 108 80 91  Resp: 20 18 19 20   Temp: (!) 97.4 F (36.3 C) 98.1 F (36.7 C) 98.5 F (36.9 C) (!) 97.3 F (36.3 C)  TempSrc: Oral Oral Oral Oral  SpO2: 98% 96% 99% 95%  Weight:   86.1 kg   Height:        Intake/Output Summary (Last 24 hours) at 03/08/2020 1204 Last data filed at 03/08/2020 0458 Gross per 24 hour  Intake 360 ml  Output 3900 ml  Net -3540 ml   Filed Weights   03/06/20 0522 03/07/20 0628 03/08/20 0454  Weight: 84.7 kg 86.2 kg 86.1 kg        Physical Exam:   General:  Alert,  baseline confusion, hard of hearing  HEENT:  Normocephalic, PERRL, otherwise with in Normal limits   Neuro:  CNII-XII intact. , normal motor and sensation, reflexes intact   Lungs:   Clear to auscultation BL, Respirations unlabored, no wheezes / crackles  Cardio:    S1/S2, RRR, No murmure, No Rubs or Gallops   Abdomen:   Soft, non-tender, bowel sounds active all four quadrants,  no guarding or peritoneal signs.  Muscular skeletal:   Generalized 26, Limited exam - in bed, able to move all 4 extremities, Normal strength,  2+ pulses,  symmetric, No pitting edema  Skin:  Dry, warm to touch, negative for any Rashes, No open wounds  Wounds: Please see nursing documentation              Data Reviewed: I have personally reviewed following labs and imaging  studies  CBC: Recent Labs  Lab 03/03/20 1120 03/03/20 1120 03/04/20 0331 03/05/20 0220 03/06/20 1015 03/07/20 0635 03/08/20 0408  WBC 16.4*   < > 13.6* 7.7 5.4 7.9 6.8  NEUTROABS 13.8*  --   --  4.8 2.8 4.8  --   HGB 11.3*   < > 9.5* 10.6* 10.2* 11.9* 10.1*  HCT 35.3*   < > 28.6* 30.3* 30.6* 36.5* 31.4*  MCV 98.6   < > 95.3 94.4 96.8 98.6 98.7  PLT 150   < > 112* 135* 151 186 146*   < > = values in this interval not displayed.   Basic Metabolic Panel: Recent Labs  Lab 03/04/20 0331 03/05/20 0220 03/06/20 1015 03/07/20 0635 03/08/20 0408  NA 140 135 140 140 137  K 3.3* 3.2* 3.3* 3.9 3.9  CL 112* 101 107 107 107  CO2 16* 22 22 23  18*  GLUCOSE 112* 117* 130* 109* 88  BUN 79* 59* 41* 41* 43*  CREATININE 4.63* 3.29* 2.33* 2.36* 2.43*  CALCIUM 8.6* 8.4* 8.3* 8.9  8.7*  MG  --  1.2* 1.5* 2.1  --    GFR: Estimated Creatinine Clearance: 24.8 mL/min (A) (by C-G formula based on SCr of 2.43 mg/dL (H)). Liver Function Tests: Recent Labs  Lab 03/03/20 1120 03/05/20 0220  AST 22 24  ALT 19 19  ALKPHOS 90 78  BILITOT 0.9 0.5  PROT 6.2* 5.6*  ALBUMIN 3.0* 2.6*   Recent Labs  Lab 03/03/20 1120  LIPASE 46   Recent Labs  Lab 03/05/20 0220  AMMONIA 24   Coagulation Profile: Recent Labs  Lab 03/03/20 1120  INR 1.3*   Cardiac Enzymes: Recent Labs  Lab 03/03/20 1120  CKTOTAL 34*   BNP (last 3 results) No results for input(s): PROBNP in the last 8760 hours. HbA1C: No results for input(s): HGBA1C in the last 72 hours. CBG: Recent Labs  Lab 03/03/20 1101  GLUCAP 119*   Lipid Profile: No results for input(s): CHOL, HDL, LDLCALC, TRIG, CHOLHDL, LDLDIRECT in the last 72 hours. Thyroid Function Tests: No results for input(s): TSH, T4TOTAL, FREET4, T3FREE, THYROIDAB in the last 72 hours. Anemia Panel: No results for input(s): VITAMINB12, FOLATE, FERRITIN, TIBC, IRON, RETICCTPCT in the last 72 hours. Sepsis Labs: Recent Labs  Lab 03/03/20 1120  03/03/20 1452  LATICACIDVEN 1.7 1.2    Recent Results (from the past 240 hour(s))  Blood culture (routine x 2)     Status: None (Preliminary result)   Collection Time: 03/03/20 11:20 AM   Specimen: BLOOD  Result Value Ref Range Status   Specimen Description BLOOD RIGHT ANTECUBITAL  Final   Special Requests   Final    BOTTLES DRAWN AEROBIC AND ANAEROBIC Blood Culture results may not be optimal due to an inadequate volume of blood received in culture bottles   Culture  Setup Time   Final    GRAM POSITIVE RODS ANAEROBIC BOTTLE ONLY CRITICAL RESULT CALLED TO, READ BACK BY AND VERIFIED WITH: L. CHEN,PHARMD 0545 03/05/2020 T. TYSOR    Culture   Final    GRAM POSITIVE RODS IDENTIFICATION TO FOLLOW Performed at [facility], [address], [place], [place] [address]    Report Status PENDING  Incomplete  Urine culture     Status: None   Collection Time: 03/03/20  1:49 PM   Specimen: Urine, Random  Result Value Ref Range Status   Specimen Description URINE, RANDOM  Final   Special Requests NONE  Final   Culture   Final    NO GROWTH Performed at [facility], [address] [place], [place] [address]    Report Status 03/04/2020 FINAL  Final  Respiratory Panel by RT PCR (Flu A&B, Covid) - Nasopharyngeal Swab     Status: None   Collection Time: 03/03/20  2:30 PM   Specimen: Nasopharyngeal Swab  Result Value Ref Range Status   SARS Coronavirus 2 by RT PCR NEGATIVE NEGATIVE Final    Comment: (NOTE) SARS-CoV-2 target nucleic acids are NOT DETECTED.  The SARS-CoV-2 RNA is generally detectable in upper respiratoy specimens during the acute phase of infection. The lowest concentration of SARS-CoV-2 viral copies this assay can detect is 131 copies/mL. A negative result does not preclude SARS-Cov-2 infection and should not be used as the sole basis for treatment or other patient management decisions. A negative result may occur with  improper specimen collection/handling,  submission of specimen other than nasopharyngeal swab, presence of viral mutation(s) within the areas targeted by this assay, and inadequate number of viral copies (<131  copies/mL). A negative result must be combined with clinical observations, patient history, and epidemiological information. The expected result is Negative.  Fact Sheet for Patients:  https://www.moore.com/  Fact Sheet for Healthcare Providers:  https://www.young.biz/  This test is no t yet approved or cleared by the [place] FDA and  has been authorized for detection and/or diagnosis of SARS-CoV-2 by FDA under an Emergency Use Authorization (EUA). This EUA will remain  in effect (meaning this test can be used) for the duration of the COVID-19 declaration under Section 564(b)(1) of the Act, 21 U.S.C. section 360bbb-3(b)(1), unless the authorization is terminated or revoked sooner.     Influenza A by PCR NEGATIVE NEGATIVE Final   Influenza B by PCR NEGATIVE NEGATIVE Final    Comment: (NOTE) The Xpert Xpress SARS-CoV-2/FLU/RSV assay is intended as an aid in  the diagnosis of influenza from Nasopharyngeal swab specimens and  should not be used as a sole basis for treatment. Nasal washings and  aspirates are unacceptable for Xpert Xpress SARS-CoV-2/FLU/RSV  testing.  Fact Sheet for Patients: https://www.moore.com/  Fact Sheet for Healthcare Providers: https://www.young.biz/  This test is not yet approved or cleared by the [place] FDA and  has been authorized for detection and/or diagnosis of SARS-CoV-2 by  FDA under an Emergency Use Authorization (EUA). This EUA will remain  in effect (meaning this test can be used) for the duration of the  Covid-19 declaration under Section 564(b)(1) of the Act, 21  U.S.C. section 360bbb-3(b)(1), unless the authorization is  terminated or revoked. Performed at [facility], [address], [place], [place] [address]   Blood culture (routine x 2)     Status: None   Collection Time: 03/03/20 10:04 PM   Specimen: BLOOD  Result Value Ref Range Status   Specimen Description BLOOD RIGHT THUMB  Final   Special Requests   Final    BOTTLES DRAWN AEROBIC ONLY Blood Culture results may not be optimal due to an inadequate volume of blood received in culture bottles   Culture   Final    NO GROWTH 5 DAYS Performed at [facility], [address] [place], [place] [address]    Report Status 03/08/2020 FINAL  Final  Culture, blood (routine x 2)     Status: None (Preliminary result)   Collection Time: 03/07/20  2:17 PM   Specimen: BLOOD  Result Value Ref Range Status   Specimen Description BLOOD LEFT ANTECUBITAL  Final   Special Requests   Final    BOTTLES DRAWN AEROBIC AND ANAEROBIC Blood Culture adequate volume   Culture   Final    NO GROWTH < 24 HOURS Performed at [facility], [address], [place], [place] [address]    Report Status PENDING  Incomplete  Culture, blood (routine x 2)     Status: None (Preliminary result)   Collection Time: 03/07/20  2:20 PM   Specimen: BLOOD  Result Value Ref Range Status   Specimen Description BLOOD RIGHT ANTECUBITAL  Final   Special Requests   Final    BOTTLES DRAWN AEROBIC AND ANAEROBIC Blood Culture adequate volume   Culture   Final    NO GROWTH < 24 HOURS Performed at [facility], [address], [place], [place] [address]    Report Status PENDING  Incomplete         Radiology Studies: No results found.      Scheduled Meds: . aspirin EC  81 mg  Oral Daily  . Chlorhexidine Gluconate Cloth  6 each Topical Daily  . ciprofloxacin  500 mg Oral BID  . heparin  5,000 Units Subcutaneous Q12H  . pantoprazole  40 mg Oral Daily  . QUEtiapine  12.5 mg Oral QHS  . tamsulosin  0.4 mg Oral Daily   Continuous Infusions: . sodium chloride 75 mL/hr at 03/08/20 0503          Kendell BaneSeyed A Yen Wandell,  MD [facility] 03/08/2020, 12:04 PM

## 2020-03-08 NOTE — Progress Notes (Signed)
Patient refusing to take any medication that is not crushed in pudding. He spits them out and pushes staff away. Will reassess later.   0030 update: after in & out cath, pt resting with eyes closed. Did not take medications.

## 2020-03-08 NOTE — Progress Notes (Addendum)
  Speech Language Pathology Treatment: Dysphagia  Patient Details Name: Christian Warren. MRN: [id number] DOB: June 27, 1932 Today's Date: 03/08/2020 Time: 3536-1443 SLP Time Calculation (min) (ACUTE ONLY): 9 min  Assessment / Plan / Recommendation Clinical Impression  Pt was seen for dysphagia treatment and was cooperative throughout the session with encouragement. Christian Baas, RN reported that the pt is tolerating the advanced diet well but does not allow staff to feed him. Per RN, pt spits out whole medications so many of them have been crushed. Pt tolerated dysphagia 3 solids, dysphagia 2 solids, mixed consistency boluses, and thin liquids via straw using consecutive swallows without symptoms of aspiration. Mastication was [facility] with dysphagia 2 solids but continues to be prolonged with more advanced solids. Mild lingual residue was cleared with liquid washes. The session was terminated prematurely due to pt's refusal of additional boluses. It is recommended that the current diet be continued. Further skilled SLP services are not clinically indicated at this time.    HPI HPI:  Christian Warren. is a 86 y.o. male with medical history significant of HTN, BPH, OA, was found unresponsive at nursing home.  Patient with recently hospitalized for rhabdomyolysis and AKI and then discharged back to nursing home.  Head CT was negative for acute intracranial abnormality.  CXR was remarkable for "linear bibasilar opacities, favoring atelectasis."      SLP Plan  All goals met;Discharge SLP treatment due to (comment)       Recommendations  Diet recommendations: Dysphagia 2 (fine chop);Thin liquid Liquids provided via: Cup;Straw Medication Administration: Crushed with puree Supervision: Staff to assist with self feeding Compensations: Minimize environmental distractions;Slow rate;Small sips/bites                Oral Care Recommendations: Oral care BID Follow up Recommendations: None SLP Visit Diagnosis:  Dysphagia, oral phase (R13.11) Plan: All goals met;Discharge SLP treatment due to (comment)       Christian Warren, [place], [place] Office number [phone] Pager [place] 03/08/2020, 5:31 PM

## 2020-03-08 NOTE — Progress Notes (Signed)
PT Cancellation Note  Patient Details Name: Christian Warren. MRN: [id number] DOB: 1933-02-06   Cancelled Treatment:    Reason Eval/Treat Not Completed: Patient declined, no reason specified Pt initially receptive to working with therapy. PT donned pt socks and encouraged pt to come to EOB. Pt responded with raised voice. "Why are you bothering me?" And pulled his blankets back up. PT will follow back for treatment tomorrow.   Christian Warren B. Beverely Risen PT, DPT Acute Rehabilitation Services Pager [phone] Office [phone]    Christian Warren 03/08/2020, 2:04 PM

## 2020-03-09 DIAGNOSIS — Z515 Encounter for palliative care: Secondary | ICD-10-CM

## 2020-03-09 DIAGNOSIS — Z66 Do not resuscitate: Secondary | ICD-10-CM

## 2020-03-09 DIAGNOSIS — Z7189 Other specified counseling: Secondary | ICD-10-CM

## 2020-03-09 LAB — BASIC METABOLIC PANEL
Anion gap: 9 (ref 5–15)
BUN: 29 mg/dL — ABNORMAL HIGH (ref 8–23)
CO2: 22 mmol/L (ref 22–32)
Calcium: 8.7 mg/dL — ABNORMAL LOW (ref 8.9–10.3)
Chloride: 105 mmol/L (ref 98–111)
Creatinine, Ser: 1.92 mg/dL — ABNORMAL HIGH (ref 0.61–1.24)
GFR calc Af Amer: 36 mL/min — ABNORMAL LOW (ref >60)
GFR calc non Af Amer: 31 mL/min — ABNORMAL LOW (ref >60)
Glucose, Bld: 91 mg/dL (ref 70–99)
Potassium: 3.1 mmol/L — ABNORMAL LOW (ref 3.5–5.1)
Sodium: 136 mmol/L (ref 135–145)

## 2020-03-09 LAB — CBC
HCT: 30 % — ABNORMAL LOW (ref 39.0–52.0)
Hemoglobin: 9.8 g/dL — ABNORMAL LOW (ref 13.0–17.0)
MCH: 31.9 pg (ref 26.0–34.0)
MCHC: 32.7 g/dL (ref 30.0–36.0)
MCV: 97.7 fL (ref 80.0–100.0)
Platelets: 184 10*3/uL (ref 150–400)
RBC: 3.07 MIL/uL — ABNORMAL LOW (ref 4.22–5.81)
RDW: 13.9 % (ref 11.5–15.5)
WBC: 4.4 10*3/uL (ref 4.0–10.5)
nRBC: 0 % (ref 0.0–0.2)

## 2020-03-09 LAB — MAGNESIUM: Magnesium: 1.4 mg/dL — ABNORMAL LOW (ref 1.7–2.4)

## 2020-03-09 MED ORDER — CEPHALEXIN 500 MG PO CAPS
500.0000 mg | ORAL_CAPSULE | Freq: Two times a day (BID) | ORAL | Status: AC
  Administered 2020-03-09: 500 mg via ORAL
  Filled 2020-03-09: qty 1

## 2020-03-09 MED ORDER — ENSURE ENLIVE PO LIQD
237.0000 mL | Freq: Three times a day (TID) | ORAL | Status: DC
  Administered 2020-03-09 – 2020-03-11 (×6): 237 mL via ORAL

## 2020-03-09 MED ORDER — MAGNESIUM SULFATE 4 GM/100ML IV SOLN: 4.0000 g | Freq: Once | INTRAVENOUS | Status: DC

## 2020-03-09 MED ORDER — TAMSULOSIN HCL 0.4 MG PO CAPS
0.4000 mg | ORAL_CAPSULE | Freq: Two times a day (BID) | ORAL | Status: DC
  Administered 2020-03-09 – 2020-03-10 (×2): 0.4 mg via ORAL
  Filled 2020-03-09 (×4): qty 1

## 2020-03-09 MED ORDER — ENSURE ENLIVE PO LIQD
237.0000 mL | Freq: Three times a day (TID) | ORAL | Status: DC
  Administered 2020-03-09: 237 mL via ORAL

## 2020-03-09 MED ORDER — POTASSIUM CHLORIDE CRYS ER 20 MEQ PO TBCR
40.0000 meq | EXTENDED_RELEASE_TABLET | Freq: Once | ORAL | Status: AC
  Administered 2020-03-09: 40 meq via ORAL
  Filled 2020-03-09: qty 2

## 2020-03-09 MED ORDER — MAGNESIUM GLUCONATE 500 MG PO TABS
500.0000 mg | ORAL_TABLET | ORAL | Status: AC
  Administered 2020-03-09: 500 mg via ORAL
  Filled 2020-03-09 (×2): qty 1

## 2020-03-09 NOTE — Progress Notes (Signed)
Physical Therapy Treatment Patient Details Name: Jaymarion Trombly. MRN: [id number] DOB: [birth year] Today's Date: 03/09/2020    History of Present Illness 86 year old male with history of hypertension, BPH, OA, recent hospitalization for rhabdomyolysis and AKI with subsequent discharge to nursing home was found unresponsive at nursing home.  On presentation, initial blood pressure was 80/50 which improved to 116/60 after 200 cc normal saline bolus following which patient became more awake and responded to questions but was still confused.  Pt dx with severe sepsis, UTI, probable acute pyelonephritis/urinary retention, AKI, thrombocytopenia, hypokalemia, hypomagnesemia, acute metabolic encepholopathy.     PT Comments    Limited session due to pt's cognitive deficits.  He is confused, resistive to mobility and easily agitated to the point of swatting at this therapist.  I was able to reposition him in the bed (he was at the very end of the bed with his head against the rail) for comfort and for pressure relief.  I could not motivate him with anything to come EOB.     Follow Up Recommendations  SNF     Equipment Recommendations  Hospital bed;Wheelchair (measurements PT);Wheelchair cushion (measurements PT);Other (comment) (hoyer lift)    Recommendations for Other Services       Precautions / Restrictions Precautions Precautions: Fall    Mobility  Bed Mobility Overal bed mobility: Needs Assistance Bed Mobility: Rolling Rolling: Max assist         General bed mobility comments: Max assist to roll partially bil to fix bed pads, pt resistive to motion in the opposite direction that PT was attempting to turn him.  Bed flipped in maximal trendelenberg and pt did attempt to push with legs (would not allow hand over hand arm to reach for rail assist without resistance and some swatting at the therapist).  Max assist to scoot to [facility].  Positioned for comfort and pressure relief on his right side at  end of session.   Transfers                 General transfer comment: Unable to convice pt to come to EOB and with attempts at moving him he would resist and start getting angry.   Ambulation/Gait                 Stairs             Wheelchair Mobility    Modified Rankin (Stroke Patients Only)       Balance                                            Cognition Arousal/Alertness: Awake/alert Behavior During Therapy: Agitated (easily escalated) Overall Cognitive Status: History of cognitive impairments - at baseline                                 General Comments: Per chart h/o cognitive impariments.  Not sure how close he is to his baseline, but he does not know where he is or why, he can get physically aggressive with attempts at mobility, nonsensical language at times.        Exercises Other Exercises Other Exercises: Attempted ROM to UEs and LEs, however, pt resistive.  Attempted to convince pt of reasons to sit EOB (to help his back, to eat his lunch) and ultimately unable  to presuade him.     General Comments        Pertinent Vitals/Pain Pain Assessment: Faces Faces Pain Scale: Hurts little more Pain Location: generalized Pain Descriptors / Indicators: Aching;Grimacing;Guarding Pain Intervention(s): Limited activity within patient's tolerance;Monitored during session;Repositioned    Home Living                      Prior Function            PT Goals (current goals can now be found in the care plan section) Acute Rehab PT Goals Patient Stated Goal: unable to state Progress towards PT goals: Progressing toward goals    Frequency    Min 2X/week      PT Plan Current plan remains appropriate    Co-evaluation              AM-PAC PT "6 Clicks" Mobility   Outcome Measure  Help needed turning from your back to your side while in a flat bed without using bedrails?: Total Help needed  moving from lying on your back to sitting on the side of a flat bed without using bedrails?: Total Help needed moving to and from a bed to a chair (including a wheelchair)?: Total Help needed standing up from a chair using your arms (e.g., wheelchair or bedside chair)?: Total Help needed to walk in hospital room?: Total Help needed climbing 3-5 steps with a railing? : Total 6 Click Score: 6    End of Session   Activity Tolerance: Other (comment) (self limiting, but mostly cognition) Patient left: in bed;with call bell/phone within reach;with bed alarm set   PT Visit Diagnosis: Muscle weakness (generalized) (M62.81);Pain     Time: 5462-7035 PT Time Calculation (min) (ACUTE ONLY): 13 min  Charges:  $Therapeutic Activity: 8-22 mins                     Corinna Capra, PT, DPT  Acute Rehabilitation [phone] pager [phone]) [phone] office

## 2020-03-09 NOTE — Progress Notes (Signed)
Initial Nutrition Assessment  DOCUMENTATION CODES:   Not applicable  INTERVENTION:   Ensure Enlive po TID, each supplement provides 350 kcal and 20 grams of protein  Magic cup TID with meals, each supplement provides 290 kcal and 9 grams of protein   Calorie count ordered by MD; RD to follow-up with results on Monday 10/4  NUTRITION DIAGNOSIS:   Inadequate oral intake related to decreased appetite, acute illness as evidenced by meal completion < 50%.  GOAL:   Patient will meet greater than or equal to 90% of their needs  MONITOR:   PO intake, Supplement acceptance, Labs, Weight trends  REASON FOR ASSESSMENT:   Consult Assessment of nutrition requirement/status, Calorie Count  ASSESSMENT:   86 yo male admitted with sepsis with UTI, AKI, acute metabolic encephalopathy and generalized deconditioning. PMH includes HLD, HTN   9/25 Admitted  Noted some agitation, not wanting to take meds, not working with therapies  SLP following and pt is currently on Dysphagia 2, Thin Liquid diet Recorded po intake 0-75% of meals; 35% of meals on average. Pt currently on Dysphagia II diet  Unable to obtain diet and weight history from patient at this time. If weight history is correct; pt has experienced weight loss over the past 1 month. Current wt 86.1 kg. Weight of 96.5 kg in August. 10% wt loss  No pressure injuries per RN skin assessment  Labs: potassium 3.1 (L), Creatinine 1.92 (trending down) Meds: reviewed   Diet Order:   Diet Order            DIET DYS 2 Room service appropriate? Yes with Assist; Fluid consistency: Thin  Diet effective now                 EDUCATION NEEDS:   Not appropriate for education at this time  Skin:  Skin Assessment: Reviewed RN Assessment  Last BM:  9/30  Height:   Ht Readings from Last 1 Encounters:  03/03/20 6' (1.829 m)    Weight:   Wt Readings from Last 1 Encounters:  03/08/20 86.1 kg    BMI:  Body mass index is 25.74  kg/m.  Estimated Nutritional Needs:   Kcal:  1700-1900 kcals  Protein:  85-95 g  Fluid:  >/= 1.7 L   Romelle Starcher MS, RDN, LDN, CNSC Registered Dietitian III Clinical Nutrition RD Pager and On-Call Pager Number Located in [place]

## 2020-03-09 NOTE — Progress Notes (Signed)
   Palliative Medicine Inpatient Follow Up Note  Reason for consult:  Goals of Care  HPI:  Per intake H&P --> Christian Warrenis a 86 y.o.malewith medical history significant ofHTN, BPH, OA, was found unresponsive at nursing home. Patient with recently hospitalized for rhabdomyolysis and AKI and then discharged back to nursing home. Patient was last seen normal last night. This morning patient was found unresponsive but only moaning.EMS arrived and found patient blood pressure 80/50, blood pressure improved to 116/60 after 200 mL normal saline bolus and patient became awake and responded to questions. Patient remained very confused in ED, however patient reported pain in the suprapubic area, but denied any dysuria no fever or chills. No back pains.  Palliative care asked to get involved to aid in goals of care conversations.  Today's Discussion (03/09/2020): Chart reviewed. Patient able to pass swallow evaluation yesterday afternoon -->  on a dysphagia 2 diet. Worked with PT - SNF recommended. Patient presently w/ 24 hour calorie count ongoing d/t concern for weight loss per discussion with RN he ate well for breakfast though not well thereafter.   I was able to check in with Christian Warren this afternoon. He is quite hard of hearing but tells me that he is "alright". He appears comfortable though incrementally noted to be trying to remove his gown. Will implement delirium precautions.   There is no family present at bedside to provide an update.  Discussed the importance of continued conversation with family and their  medical providers regarding overall plan of care and treatment options, ensuring decisions are within the context of the patients values and GOCs.  Questions and concerns addressed   Decision Maker: Christian Warren (niece) [phone]  SUMMARY OF RECOMMENDATIONS DNAR/DNI, no HD or extraneous heroic measures  [facility] can be found in [place]  Plan for transition to  SNF   Delirium precautions  PMT will incrementally check in - pls call for any urgent needs  Time Spent:  15 Greater than 50% of the time was spent in counseling and coordination of care ______________________________________________________________________________________ Christian Warren [facility] Palliative Medicine Team Team Cell Phone: [phone] Please utilize secure chat with additional questions, if there is no response within 30 minutes please call the above phone number  Palliative Medicine Team providers are available by phone from 7am to 7pm daily and can be reached through the team cell phone.  Should this patient require assistance outside of these hours, please call the patient's attending physician.

## 2020-03-09 NOTE — Progress Notes (Signed)
PROGRESS NOTE    Christian Warren.   PTW:[id number]  DOB: Nov 14, 1932  DOA: 03/03/2020 PCP: Retia Passe, NP   Brief Narrative:  Christian Warren. 86 year old male with history of hypertension, BPH, severely hard of hearing, dementia with a recent hospitalization for rhabdomyolysis and AKI (from home) with subsequent discharge to nursing home was found unresponsive at nursing home.  On presentation, initial blood pressure was 80/50 which improved to 116/60 after 200 cc normal saline bolus following which patient became more awake and responded to questions but was still confused.  He was found to have temperature of 101.6.  Chest x-ray showed no acute infiltrates.  UA was positive.  WBC of 16, creatinine 6.1, bicarb of 14.  Bladder scan was showing more than 700 cc of urine, Foley catheter was inserted.  Nephrology was curb sided who recommended aggressive hydration and repeat renal ultrasound and if no improvement in renal function, to consult nephrology officially.   Subjective: Quite confused. No complaints.     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 When last weighed at his PCPs office he was 200 lb- he is about 190 now  Mild Dementia with acute metabolic encephalopathy due to acute infection - now alert and oriented   Severely hard of hearing   Time spent in minutes: 35 DVT prophylaxis: heparin injection 5,000 Units Start: 03/03/20 1530 Code Status: DNR Family Communication: niece, Andee Poles Disposition Plan:  Status is: Inpatient  Remains inpatient appropriate because:continue calorie count   Dispo: The patient is from: SNF              Anticipated d/c is to: SNF              Anticipated d/c date is: 3 days              Patient currently is not medically stable to d/c.      Consultants:   none Procedures:   none Antimicrobials:  Anti-infectives (From admission, onward)   Start     Dose/Rate Route Frequency Ordered Stop   03/08/20 1545  cephALEXin (KEFLEX) capsule 500 mg        500 mg Oral Every 12 hours 03/08/20 1530     03/08/20 1530  cephALEXin (KEFLEX) 250 MG/5ML suspension 500 mg  Status:  Discontinued        500 mg Oral Every 12 hours 03/08/20  1525 03/08/20 1530   03/08/20 1330  cephALEXin (KEFLEX) capsule 500 mg  Status:  Discontinued        500 mg Oral Every 12 hours 03/08/20 1323 03/08/20 1525   03/08/20 1215  ciprofloxacin (CIPRO) tablet 500 mg  Status:  Discontinued        500 mg Oral 2 times daily 03/08/20 1203 03/08/20 1323   03/04/20 1200  ceFEPIme (MAXIPIME) 1 g in sodium chloride 0.9 % 100 mL IVPB  Status:  Discontinued        1 g 200 mL/hr over 30 Minutes Intravenous Every 24 hours 03/03/20 1332 03/04/20 0924   03/04/20 1200  ceFEPIme (MAXIPIME) 2 g in sodium chloride 0.9 % 100 mL IVPB  Status:  Discontinued        2 g 200 mL/hr over 30 Minutes Intravenous Every 24 hours 03/04/20 0924 03/08/20 1203   03/03/20 1332  vancomycin variable dose per unstable renal function (pharmacist dosing)   Status:  Discontinued         Does not apply See admin instructions 03/03/20 1332 03/03/20 1605   03/03/20 1130  ceFEPIme (MAXIPIME) 2 g in sodium chloride 0.9 % 100 mL IVPB        2 g 200 mL/hr over 30 Minutes Intravenous  Once 03/03/20 1122 03/03/20 1424   03/03/20 1130  metroNIDAZOLE (FLAGYL) IVPB 500 mg        500 mg 100 mL/hr over 60 Minutes Intravenous  Once 03/03/20 1122 03/03/20 1425   03/03/20 1130  vancomycin (VANCOCIN) IVPB 1000 mg/200 mL premix  Status:  Discontinued        1,000 mg 200 mL/hr over 60 Minutes Intravenous  Once 03/03/20 1122 03/03/20 1124   03/03/20 1130  vancomycin (VANCOREADY) IVPB 2000 mg/400 mL        2,000 mg 200 mL/hr over 120 Minutes Intravenous  Once 03/03/20 1124 03/03/20 2035       Objective: Vitals:   03/08/20 2040 03/09/20 0500 03/09/20 0806 03/09/20 1654  BP: 133/71 (!) 152/85 124/76 120/73  Pulse: 79 83 83 88  Resp: 18 18 16 15   Temp: 97.6 F (36.4 C) 97.9 F (36.6 C) 98.1 F (36.7 C) (!) 97.5 F (36.4 C)  TempSrc: Oral Oral Axillary Oral  SpO2: 97% (!) 9% 94%   Weight:      Height:        Intake/Output Summary (Last 24 hours) at 03/09/2020 1704 Last data filed at 03/09/2020 1000 Gross per 24 hour  Intake 2453.31 ml  Output 3200 ml  Net -746.69 ml   Filed Weights   03/06/20 0522 03/07/20 0628 03/08/20 0454  Weight: 84.7 kg 86.2 kg 86.1 kg    Examination: General exam: Appears comfortable  HEENT: PERRLA, oral mucosa moist, no sclera icterus or thrush Respiratory system: Clear to auscultation. Respiratory effort normal. Cardiovascular system: S1 & S2 heard, RRR.   Gastrointestinal system: Abdomen soft, non-tender, nondistended. Normal bowel sounds. Central nervous system: Alert and oriented. No focal neurological deficits. Extremities: No cyanosis, clubbing or edema Skin: No rashes or ulcers Psychiatry:  Mood & affect appropriate.     Data Reviewed: I have personally reviewed following labs and imaging  studies  CBC: Recent Labs  Lab 03/03/20 1120 03/04/20 0331 03/05/20 0220 03/06/20 1015 03/07/20 0635 03/08/20 0408 03/09/20 0803  WBC 16.4*   < > 7.7 5.4 7.9 6.8 4.4  NEUTROABS 13.8*  --  4.8 2.8 4.8  --   --   HGB 11.3*   < >  10.6* 10.2* 11.9* 10.1* 9.8*  HCT 35.3*   < > 30.3* 30.6* 36.5* 31.4* 30.0*  MCV 98.6   < > 94.4 96.8 98.6 98.7 97.7  PLT 150   < > 135* 151 186 146* 184   < > = values in this interval not displayed.   Basic Metabolic Panel: Recent Labs  Lab 03/05/20 0220 03/06/20 1015 03/07/20 0635 03/08/20 0408 03/09/20 0803 03/09/20 1155  NA 135 140 140 137 136  --   K 3.2* 3.3* 3.9 3.9 3.1*  --   CL 101 107 107 107 105  --   CO2 18* 22  --   GLUCOSE 117* 130* 109* 88 91  --   BUN 59* 41* 41* 43* 29*  --   CREATININE 3.29* 2.33* 2.36* 2.43* 1.92*  --   CALCIUM 8.4* 8.3* 8.9 8.7* 8.7*  --   MG 1.2* 1.5* 2.1  --   --  1.4*   GFR: Estimated Creatinine Clearance: 31.4 mL/min (A) (by C-G formula based on SCr of 1.92 mg/dL (H)). Liver Function Tests: Recent Labs  Lab 03/03/20 1120 03/05/20 0220  AST 22 24  ALT 19 19  ALKPHOS 90 78  BILITOT 0.9 0.5  PROT 6.2* 5.6*  ALBUMIN 3.0* 2.6*   Recent Labs  Lab 03/03/20 1120  LIPASE 46   Recent Labs  Lab 03/05/20 0220  AMMONIA 24   Coagulation Profile: Recent Labs  Lab 03/03/20 1120  INR 1.3*   Cardiac Enzymes: Recent Labs  Lab 03/03/20 1120  CKTOTAL 34*   BNP (last 3 results) No results for input(s): PROBNP in the last 8760 hours. HbA1C: No results for input(s): HGBA1C in the last 72 hours. CBG: Recent Labs  Lab 03/03/20 1101  GLUCAP 119*   Lipid Profile: No results for input(s): CHOL, HDL, LDLCALC, TRIG, CHOLHDL, LDLDIRECT in the last 72 hours. Thyroid Function Tests: No results for input(s): TSH, T4TOTAL, FREET4, T3FREE, THYROIDAB in the last 72 hours. Anemia Panel: No results for input(s): VITAMINB12, FOLATE, FERRITIN, TIBC, IRON, RETICCTPCT in the last 72 hours. Urine  analysis:    Component Value Date/Time   COLORURINE YELLOW 03/08/2020 1722   APPEARANCEUR CLEAR 03/08/2020 1722   LABSPEC 1.008 03/08/2020 1722   PHURINE 6.0 03/08/2020 1722   GLUCOSEU NEGATIVE 03/08/2020 1722   HGBUR MODERATE (A) 03/08/2020 1722   BILIRUBINUR NEGATIVE 03/08/2020 1722   KETONESUR NEGATIVE 03/08/2020 1722   PROTEINUR NEGATIVE 03/08/2020 1722   NITRITE NEGATIVE 03/08/2020 1722   LEUKOCYTESUR MODERATE (A) 03/08/2020 1722   Sepsis Labs: (procalcitonin:4,lacticidven:4) ) Recent Results (from the past 240 hour(s))  Blood culture (routine x 2)     Status: Abnormal   Collection Time: 03/03/20 11:20 AM   Specimen: BLOOD  Result Value Ref Range Status   Specimen Description BLOOD RIGHT ANTECUBITAL  Final   Special Requests   Final    BOTTLES DRAWN AEROBIC AND ANAEROBIC Blood Culture results may not be optimal due to an inadequate volume of blood received in culture bottles   Culture  Setup Time   Final    GRAM POSITIVE RODS ANAEROBIC BOTTLE ONLY CRITICAL RESULT CALLED TO, READ BACK BY AND VERIFIED WITH: Carmell Austria 1610 03/05/2020 Girtha Hake Performed at [facility], [address], [place], [place] [address]    Culture ANAEROBIC GRAM POSITIVE RODS Actinotignum schaalii (A)  Final   Report Status 03/08/2020 FINAL  Final  Urine culture     Status: None   Collection Time: 03/03/20  1:49 PM   Specimen: Urine, Random  Result Value Ref Range Status   Specimen Description URINE, RANDOM  Final   Special Requests NONE  Final   Culture   Final    NO GROWTH Performed at [facility], [address], [place], [place] [address]    Report Status 03/04/2020 FINAL  Final  Respiratory Panel by RT PCR (Flu A&B, Covid) - Nasopharyngeal Swab     Status: None   Collection Time: 03/03/20  2:30 PM   Specimen: Nasopharyngeal Swab  Result Value Ref Range Status   SARS Coronavirus 2 by RT PCR NEGATIVE NEGATIVE Final    Comment: (NOTE) SARS-CoV-2 target nucleic  acids are NOT DETECTED.  The SARS-CoV-2 RNA is generally detectable in upper respiratoy specimens during the acute phase of infection. The lowest concentration of SARS-CoV-2 viral copies this assay can detect is 131 copies/mL. A negative result does not preclude SARS-Cov-2 infection and should not be used as the sole basis for treatment or other patient management decisions. A negative result may occur with  improper specimen collection/handling, submission of specimen other than nasopharyngeal swab, presence of viral mutation(s) within the areas targeted by this assay, and inadequate number of viral copies (<131 copies/mL). A negative result must be combined with clinical observations, patient history, and epidemiological information. The expected result is Negative.  Fact Sheet for Patients:  https://www.moore.com/  Fact Sheet for Healthcare Providers:  https://www.young.biz/  This test is no t yet approved or cleared by the [place] FDA and  has been authorized for detection and/or diagnosis of SARS-CoV-2 by FDA under an Emergency Use Authorization (EUA). This EUA will remain  in effect (meaning this test can be used) for the duration of the COVID-19 declaration under Section 564(b)(1) of the Act, 21 U.S.C. section 360bbb-3(b)(1), unless the authorization is terminated or revoked sooner.     Influenza A by PCR NEGATIVE NEGATIVE Final   Influenza B by PCR NEGATIVE NEGATIVE Final    Comment: (NOTE) The Xpert Xpress SARS-CoV-2/FLU/RSV assay is intended as an aid in  the diagnosis of influenza from Nasopharyngeal swab specimens and  should not be used as a sole basis for treatment. Nasal washings and  aspirates are unacceptable for Xpert Xpress SARS-CoV-2/FLU/RSV  testing.  Fact Sheet for Patients: https://www.moore.com/  Fact Sheet for Healthcare Providers: https://www.young.biz/  This test  is not yet approved or cleared by the [place] FDA and  has been authorized for detection and/or diagnosis of SARS-CoV-2 by  FDA under an Emergency Use Authorization (EUA). This EUA will remain  in effect (meaning this test can be used) for the duration of the  Covid-19 declaration under Section 564(b)(1) of the Act, 21  U.S.C. section 360bbb-3(b)(1), unless the authorization is  terminated or revoked. Performed at [facility], [address], [place], [place] [address]   Blood culture (routine x 2)     Status: None   Collection Time: 03/03/20 10:04 PM   Specimen: BLOOD  Result Value Ref Range Status   Specimen Description BLOOD RIGHT THUMB  Final   Special Requests   Final    BOTTLES DRAWN AEROBIC ONLY Blood Culture results may not be optimal due to an inadequate volume of blood received in culture bottles   Culture   Final    NO GROWTH 5 DAYS Performed at [facility], [address], [place], [place] [address]    Report Status 03/08/2020 FINAL  Final  Culture, blood (routine  x 2)     Status: None (Preliminary result)   Collection Time: 03/07/20  2:17 PM   Specimen: BLOOD  Result Value Ref Range Status   Specimen Description BLOOD LEFT ANTECUBITAL  Final   Special Requests   Final    BOTTLES DRAWN AEROBIC AND ANAEROBIC Blood Culture adequate volume   Culture   Final    NO GROWTH 2 DAYS Performed at [facility], [address], [place], [place] [address]    Report Status PENDING  Incomplete  Culture, blood (routine x 2)     Status: None (Preliminary result)   Collection Time: 03/07/20  2:20 PM   Specimen: BLOOD  Result Value Ref Range Status   Specimen Description BLOOD RIGHT ANTECUBITAL  Final   Special Requests   Final    BOTTLES DRAWN AEROBIC AND ANAEROBIC Blood Culture adequate volume   Culture   Final    NO GROWTH 2 DAYS Performed at [facility], [address] [place], [place] [address]    Report Status PENDING  Incomplete          Radiology Studies: No results found.    Scheduled Meds: . aspirin  81 mg Oral Daily  . cephALEXin  500 mg Oral Q12H  . Chlorhexidine Gluconate Cloth  6 each Topical Daily  . feeding supplement (ENSURE ENLIVE)  237 mL Oral TID BM  . finasteride  5 mg Oral Daily  . heparin  5,000 Units Subcutaneous Q12H  . pantoprazole  40 mg Oral Daily  . QUEtiapine  12.5 mg Oral QHS  . tamsulosin  0.4 mg Oral Daily   Continuous Infusions: . sodium chloride 75 mL/hr at 03/08/20 2030     LOS: 6 days      Calvert Cantor, MD [facility] Pager: [email or web address] 03/09/2020, 5:04 PM

## 2020-03-10 DIAGNOSIS — R634 Abnormal weight loss: Secondary | ICD-10-CM

## 2020-03-10 DIAGNOSIS — R630 Anorexia: Secondary | ICD-10-CM

## 2020-03-10 LAB — BASIC METABOLIC PANEL
Anion gap: 9 (ref 5–15)
BUN: 25 mg/dL — ABNORMAL HIGH (ref 8–23)
CO2: 23 mmol/L (ref 22–32)
Calcium: 9 mg/dL (ref 8.9–10.3)
Chloride: 104 mmol/L (ref 98–111)
Creatinine, Ser: 1.76 mg/dL — ABNORMAL HIGH (ref 0.61–1.24)
GFR calc Af Amer: 40 mL/min — ABNORMAL LOW (ref >60)
GFR calc non Af Amer: 35 mL/min — ABNORMAL LOW (ref >60)
Glucose, Bld: 95 mg/dL (ref 70–99)
Potassium: 3.9 mmol/L (ref 3.5–5.1)
Sodium: 136 mmol/L (ref 135–145)

## 2020-03-10 LAB — CBC
HCT: 29.5 % — ABNORMAL LOW (ref 39.0–52.0)
Hemoglobin: 9.6 g/dL — ABNORMAL LOW (ref 13.0–17.0)
MCH: 31.9 pg (ref 26.0–34.0)
MCHC: 32.5 g/dL (ref 30.0–36.0)
MCV: 98 fL (ref 80.0–100.0)
Platelets: 183 10*3/uL (ref 150–400)
RBC: 3.01 MIL/uL — ABNORMAL LOW (ref 4.22–5.81)
RDW: 13.7 % (ref 11.5–15.5)
WBC: 4 10*3/uL (ref 4.0–10.5)
nRBC: 0 % (ref 0.0–0.2)

## 2020-03-10 LAB — MAGNESIUM: Magnesium: 1.3 mg/dL — ABNORMAL LOW (ref 1.7–2.4)

## 2020-03-10 MED ORDER — INFLUENZA VAC A&B SA ADJ QUAD 0.5 ML IM PRSY
0.5000 mL | PREFILLED_SYRINGE | INTRAMUSCULAR | Status: AC
  Administered 2020-03-11: 0.5 mL via INTRAMUSCULAR
  Filled 2020-03-10: qty 0.5

## 2020-03-10 MED ORDER — HALOPERIDOL LACTATE 5 MG/ML IJ SOLN: 3.0000 mg | Freq: Four times a day (QID) | INTRAMUSCULAR | Status: DC | PRN

## 2020-03-10 MED ORDER — MAGNESIUM SULFATE 4 GM/100ML IV SOLN
4.0000 g | Freq: Once | INTRAVENOUS | Status: AC
  Administered 2020-03-10: 4 g via INTRAVENOUS
  Filled 2020-03-10: qty 100

## 2020-03-10 NOTE — Progress Notes (Signed)
PROGRESS NOTE    Christian Warren.   MEB:[id number]  DOB: 05/23/1933  DOA: 03/03/2020 PCP: Retia Passe, NP   Brief Narrative:  Christian Warren. 86 year old male with history of hypertension, BPH, severely hard of hearing, dementia with a recent hospitalization for rhabdomyolysis and AKI (from home) with subsequent discharge to nursing home was found unresponsive at nursing home.  On presentation, initial blood pressure was 80/50 which improved to 116/60 after 200 cc normal saline bolus following which patient became more awake and responded to questions but was still confused.  He was found to have temperature of 101.6.  Chest x-ray showed no acute infiltrates.  UA was positive.  WBC of 16, creatinine 6.1, bicarb of 14.  Bladder scan was showing more than 700 cc of urine, Foley catheter was inserted.  Nephrology was curb sided who recommended aggressive hydration and repeat renal ultrasound and if no improvement in renal function, to consult nephrology officially.   Subjective: No complaints. He is eating better today.    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 When last weighed at his PCPs office he was 200 lb- he is about 190 now - continue calorie count  Mild Dementia with acute metabolic encephalopathy due to acute infection - now alert and oriented   Severely hard of hearing   Time spent in minutes: 35 DVT prophylaxis: heparin injection 5,000 Units Start: 03/03/20 1530 Code Status: DNR Family Communication: niece, Andee Poles Disposition Plan:  Status is: Inpatient  Remains inpatient appropriate because:continue calorie count   Dispo: The patient is from: SNF              Anticipated d/c is to: SNF              Anticipated d/c date is: 3 days              Patient currently is not medically stable to d/c.  Consultants:   none Procedures:   none Antimicrobials:  Anti-infectives (From admission, onward)   Start     Dose/Rate Route Frequency Ordered Stop   03/09/20 2200  cephALEXin (KEFLEX) capsule 500 mg        500 mg Oral Every 12 hours 03/09/20 1711 03/09/20 2205   03/08/20 1545  cephALEXin (KEFLEX) capsule 500 mg  Status:  Discontinued        500 mg Oral Every 12 hours 03/08/20  1530 03/09/20 1711   03/08/20 1530  cephALEXin (KEFLEX) 250 MG/5ML suspension 500 mg  Status:  Discontinued        500 mg Oral Every 12 hours 03/08/20 1525 03/08/20 1530   03/08/20 1330  cephALEXin (KEFLEX) capsule 500 mg  Status:  Discontinued        500 mg Oral Every 12 hours 03/08/20 1323 03/08/20 1525   03/08/20 1215  ciprofloxacin (CIPRO) tablet 500 mg  Status:  Discontinued        500 mg Oral 2 times daily 03/08/20 1203 03/08/20 1323   03/04/20 1200  ceFEPIme (MAXIPIME) 1 g in sodium chloride 0.9 % 100 mL IVPB  Status:  Discontinued        1 g 200 mL/hr over 30 Minutes Intravenous Every 24 hours 03/03/20 1332 03/04/20 0924   03/04/20 1200  ceFEPIme (MAXIPIME) 2 g in sodium chloride 0.9 % 100 mL IVPB  Status:  Discontinued        2  g 200 mL/hr over 30 Minutes Intravenous Every 24 hours 03/04/20 0924 03/08/20 1203   03/03/20 1332  vancomycin variable dose per unstable renal function (pharmacist dosing)  Status:  Discontinued         Does not apply See admin instructions 03/03/20 1332 03/03/20 1605   03/03/20 1130  ceFEPIme (MAXIPIME) 2 g in sodium chloride 0.9 % 100 mL IVPB        2 g 200 mL/hr over 30 Minutes Intravenous  Once 03/03/20 1122 03/03/20 1424   03/03/20 1130  metroNIDAZOLE (FLAGYL) IVPB 500 mg        500 mg 100 mL/hr over 60 Minutes Intravenous  Once 03/03/20 1122 03/03/20 1425   03/03/20 1130  vancomycin (VANCOCIN) IVPB 1000 mg/200 mL premix  Status:  Discontinued        1,000 mg 200 mL/hr over 60 Minutes Intravenous  Once 03/03/20 1122 03/03/20 1124   03/03/20 1130  vancomycin (VANCOREADY) IVPB 2000 mg/400 mL        2,000 mg 200 mL/hr over 120 Minutes Intravenous  Once 03/03/20 1124 03/03/20 2035       Objective: Vitals:   03/09/20 2358 03/10/20 0417 03/10/20 0700 03/10/20 1633  BP: 107/71 128/67 120/69 120/72  Pulse: 90 93 90 92  Resp: 19 18 16 18   Temp: 97.7 F (36.5 C) (!) 97.4 F (36.3 C) 97.9 F (36.6 C) 98.2 F (36.8 C)  TempSrc: Axillary Axillary Axillary Oral  SpO2:   96% 96%  Weight:  84.6 kg    Height:        Intake/Output Summary (Last 24 hours) at 03/10/2020 1658 Last data filed at 03/10/2020 1345 Gross per 24 hour  Intake 812 ml  Output 2150 ml  Net -1338 ml   Filed Weights   03/07/20 0628 03/08/20 0454 03/10/20 0417  Weight: 86.2 kg 86.1 kg 84.6 kg    Examination: General exam: Appears comfortable  HEENT: PERRLA, oral mucosa moist, no sclera icterus or thrush Respiratory system: Clear to auscultation. Respiratory effort normal. Cardiovascular system: S1 & S2 heard, RRR.   Gastrointestinal system: Abdomen soft, non-tender, nondistended. Normal bowel sounds. Central nervous system: Alert and oriented. No focal neurological deficits. Extremities: No cyanosis,  clubbing or edema Skin: No rashes or ulcers Psychiatry:  Mood & affect appropriate.     Data Reviewed: I have personally reviewed following labs and imaging studies  CBC: Recent Labs  Lab 03/05/20 0220 03/05/20 0220 03/06/20 1015 03/07/20 [id number] 03/08/20 0408 03/09/20 0803 03/10/20 [id number]  WBC 7.7   < > 5.4 7.9 6.8 4.4 4.0  NEUTROABS 4.8  --  2.8 4.8  --   --   --   HGB 10.6*   < > 10.2* 11.9* 10.1* 9.8* 9.6*  HCT 30.3*   < > 30.6* 36.5* 31.4* 30.0* 29.5*  MCV 94.4   < > 96.8 98.6 98.7 97.7 98.0  PLT 135*   < > 151 186 146* 184 183   < > = values in this interval not displayed.   Basic Metabolic Panel: Recent Labs  Lab 03/05/20 0220 03/05/20 0220 03/06/20 1015 03/07/20 1884 03/08/20 0408 03/09/20 0803 03/09/20 1155 03/10/20 0259  NA 135   < > 140 140 137 136  --  136  K 3.2*   < > 3.3* 3.9 3.9 3.1*  --  3.9  CL 101   < > 107 107 107 105  --  104  CO2 22   < > 22 23 18* 22  --  23  GLUCOSE 117*   < > 130* 109* 88 91  --  95  BUN 59*   < > 41* 41* 43* 29*  --  25*  CREATININE 3.29*   < > 2.33* 2.36* 2.43* 1.92*  --  1.76*  CALCIUM 8.4*   < > 8.3* 8.9 8.7* 8.7*  --  9.0  MG 1.2*  --  1.5* 2.1  --   --  1.4* 1.3*   < > = values in this interval not displayed.   GFR: Estimated Creatinine Clearance: 34.3 mL/min (A) (by C-G formula based on SCr of 1.76 mg/dL (H)). Liver Function Tests: Recent Labs  Lab 03/05/20 0220  AST 24  ALT 19  ALKPHOS 78  BILITOT 0.5  PROT 5.6*  ALBUMIN 2.6*   No results for input(s): LIPASE, AMYLASE in the last 168 hours. Recent Labs  Lab 03/05/20 0220  AMMONIA 24   Coagulation Profile: No results for input(s): INR, PROTIME in the last 168 hours. Cardiac Enzymes: No results for input(s): CKTOTAL, CKMB, CKMBINDEX, TROPONINI in the last 168 hours. BNP (last 3 results) No results for input(s): PROBNP in the last 8760 hours. HbA1C: No results for input(s): HGBA1C in the last 72 hours. CBG: No results for input(s): GLUCAP in the last  168 hours. Lipid Profile: No results for input(s): CHOL, HDL, LDLCALC, TRIG, CHOLHDL, LDLDIRECT in the last 72 hours. Thyroid Function Tests: No results for input(s): TSH, T4TOTAL, FREET4, T3FREE, THYROIDAB in the last 72 hours. Anemia Panel: No results for input(s): VITAMINB12, FOLATE, FERRITIN, TIBC, IRON, RETICCTPCT in the last 72 hours. Urine analysis:    Component Value Date/Time   COLORURINE YELLOW 03/08/2020 1722   APPEARANCEUR CLEAR 03/08/2020 1722   LABSPEC 1.008 03/08/2020 1722   PHURINE 6.0 03/08/2020 1722   GLUCOSEU NEGATIVE 03/08/2020 1722   HGBUR MODERATE (A) 03/08/2020 1722   BILIRUBINUR NEGATIVE 03/08/2020 1722   KETONESUR NEGATIVE 03/08/2020 1722   PROTEINUR NEGATIVE 03/08/2020 1722   NITRITE NEGATIVE 03/08/2020 1722   LEUKOCYTESUR MODERATE (A) 03/08/2020 1722   Sepsis Labs: @LABRCNTIP (procalcitonin:4,lacticidven:4) ) Recent Results (from the past 240 hour(s))  Blood culture (routine x 2)     Status: Abnormal   Collection Time: 03/03/20 11:20 AM   Specimen: BLOOD  Result Value Ref Range Status   Specimen Description BLOOD RIGHT ANTECUBITAL  Final   Special Requests   Final    BOTTLES DRAWN AEROBIC AND ANAEROBIC Blood Culture results may not be optimal due to an inadequate volume of  blood received in culture bottles   Culture  Setup Time   Final    GRAM POSITIVE RODS ANAEROBIC BOTTLE ONLY CRITICAL RESULT CALLED TO, READ BACK BY AND VERIFIED WITH: Carmell Austria [id number] 03/05/2020 Girtha Hake Performed at [facility], [address] [place], [place] [address]    Culture ANAEROBIC GRAM POSITIVE RODS Actinotignum schaalii (A)  Final   Report Status 03/08/2020 FINAL  Final  Urine culture     Status: None   Collection Time: 03/03/20  1:49 PM   Specimen: Urine, Random  Result Value Ref Range Status   Specimen Description URINE, RANDOM  Final   Special Requests NONE  Final   Culture   Final    NO GROWTH Performed at [facility], [address],  [place], [place] [address]    Report Status 03/04/2020 FINAL  Final  Respiratory Panel by RT PCR (Flu A&B, Covid) - Nasopharyngeal Swab     Status: None   Collection Time: 03/03/20  2:30 PM   Specimen: Nasopharyngeal Swab  Result Value Ref Range Status   SARS Coronavirus 2 by RT PCR NEGATIVE NEGATIVE Final    Comment: (NOTE) SARS-CoV-2 target nucleic acids are NOT DETECTED.  The SARS-CoV-2 RNA is generally detectable in upper respiratoy specimens during the acute phase of infection. The lowest concentration of SARS-CoV-2 viral copies this assay can detect is 131 copies/mL. A negative result does not preclude SARS-Cov-2 infection and should not be used as the sole basis for treatment or other patient management decisions. A negative result may occur with  improper specimen collection/handling, submission of specimen other than nasopharyngeal swab, presence of viral mutation(s) within the areas targeted by this assay, and inadequate number of viral copies (<131 copies/mL). A negative result must be combined with clinical observations, patient history, and epidemiological information. The expected result is Negative.  Fact Sheet for Patients:  https://www.moore.com/  Fact Sheet for Healthcare Providers:  https://www.young.biz/  This test is no t yet approved or cleared by the [place] FDA and  has been authorized for detection and/or diagnosis of SARS-CoV-2 by FDA under an Emergency Use Authorization (EUA). This EUA will remain  in effect (meaning this test can be used) for the duration of the COVID-19 declaration under Section 564(b)(1) of the Act, 21 U.S.C. section 360bbb-3(b)(1), unless the authorization is terminated or revoked sooner.     Influenza A by PCR NEGATIVE NEGATIVE Final   Influenza B by PCR NEGATIVE NEGATIVE Final    Comment: (NOTE) The Xpert Xpress SARS-CoV-2/FLU/RSV assay is intended as an aid in  the diagnosis of  influenza from Nasopharyngeal swab specimens and  should not be used as a sole basis for treatment. Nasal washings and  aspirates are unacceptable for Xpert Xpress SARS-CoV-2/FLU/RSV  testing.  Fact Sheet for Patients: https://www.moore.com/  Fact Sheet for Healthcare Providers: https://www.young.biz/  This test is not yet approved or cleared by the [place] FDA and  has been authorized for detection and/or diagnosis of SARS-CoV-2 by  FDA under an Emergency Use Authorization (EUA). This EUA will remain  in effect (meaning this test can be used) for the duration of the  Covid-19 declaration under Section 564(b)(1) of the Act, 21  U.S.C. section 360bbb-3(b)(1), unless the authorization is  terminated or revoked. Performed at [facility], [address], [place], [place] [address]   Blood culture (routine x 2)     Status: None   Collection Time: 03/03/20 10:04 PM  Specimen: BLOOD  Result Value Ref Range Status   Specimen Description BLOOD RIGHT THUMB  Final   Special Requests   Final    BOTTLES DRAWN AEROBIC ONLY Blood Culture results may not be optimal due to an inadequate volume of blood received in culture bottles   Culture   Final    NO GROWTH 5 DAYS Performed at [facility], [address], [place], [place] [address]    Report Status 03/08/2020 FINAL  Final  Culture, blood (routine x 2)     Status: None (Preliminary result)   Collection Time: 03/07/20  2:17 PM   Specimen: BLOOD  Result Value Ref Range Status   Specimen Description BLOOD LEFT ANTECUBITAL  Final   Special Requests   Final    BOTTLES DRAWN AEROBIC AND ANAEROBIC Blood Culture adequate volume   Culture   Final    NO GROWTH 3 DAYS Performed at [facility], [address], [place], [place] [address]    Report Status PENDING  Incomplete  Culture, blood (routine x 2)     Status: None (Preliminary result)   Collection Time: 03/07/20  2:20 PM    Specimen: BLOOD  Result Value Ref Range Status   Specimen Description BLOOD RIGHT ANTECUBITAL  Final   Special Requests   Final    BOTTLES DRAWN AEROBIC AND ANAEROBIC Blood Culture adequate volume   Culture   Final    NO GROWTH 3 DAYS Performed at [facility], [address], [place], [place] [address]    Report Status PENDING  Incomplete         Radiology Studies: No results found.    Scheduled Meds:  aspirin  81 mg Oral Daily   Chlorhexidine Gluconate Cloth  6 each Topical Daily   feeding supplement (ENSURE ENLIVE)  237 mL Oral TID BM   finasteride  5 mg Oral Daily   heparin  5,000 Units Subcutaneous Q12H   pantoprazole  40 mg Oral Daily   QUEtiapine  12.5 mg Oral QHS   tamsulosin  0.4 mg Oral BID   Continuous Infusions:    LOS: 7 days      Calvert Cantor, MD [facility] Pager: [email or web address] 03/10/2020, 4:58 PM

## 2020-03-10 NOTE — Progress Notes (Signed)
Patient broke seal on foley.  Spoke with Dr. Butler Denmark.  She states to remove foley and replace with new one.

## 2020-03-10 NOTE — Progress Notes (Signed)
Received consent for the flu shot from his niece.  Hinton Dyer, RN

## 2020-03-11 DIAGNOSIS — N179 Acute kidney failure, unspecified: Secondary | ICD-10-CM

## 2020-03-11 DIAGNOSIS — F03A0 Unspecified dementia, mild, without behavioral disturbance, psychotic disturbance, mood disturbance, and anxiety: Secondary | ICD-10-CM

## 2020-03-11 DIAGNOSIS — N183 Chronic kidney disease, stage 3 unspecified: Secondary | ICD-10-CM

## 2020-03-11 DIAGNOSIS — N138 Other obstructive and reflux uropathy: Secondary | ICD-10-CM

## 2020-03-11 DIAGNOSIS — N401 Enlarged prostate with lower urinary tract symptoms: Secondary | ICD-10-CM

## 2020-03-11 DIAGNOSIS — N320 Bladder-neck obstruction: Secondary | ICD-10-CM

## 2020-03-11 DIAGNOSIS — H9190 Unspecified hearing loss, unspecified ear: Secondary | ICD-10-CM

## 2020-03-11 LAB — RESPIRATORY PANEL BY RT PCR (FLU A&B, COVID)
Influenza A by PCR: NEGATIVE
Influenza B by PCR: NEGATIVE
SARS Coronavirus 2 by RT PCR: NEGATIVE

## 2020-03-11 LAB — CBC
HCT: 28.9 % — ABNORMAL LOW (ref 39.0–52.0)
Hemoglobin: 9.5 g/dL — ABNORMAL LOW (ref 13.0–17.0)
MCH: 32.1 pg (ref 26.0–34.0)
MCHC: 32.9 g/dL (ref 30.0–36.0)
MCV: 97.6 fL (ref 80.0–100.0)
Platelets: 206 10*3/uL (ref 150–400)
RBC: 2.96 MIL/uL — ABNORMAL LOW (ref 4.22–5.81)
RDW: 13.8 % (ref 11.5–15.5)
WBC: 4.6 10*3/uL (ref 4.0–10.5)
nRBC: 0 % (ref 0.0–0.2)

## 2020-03-11 LAB — BASIC METABOLIC PANEL
Anion gap: 9 (ref 5–15)
BUN: 33 mg/dL — ABNORMAL HIGH (ref 8–23)
CO2: 24 mmol/L (ref 22–32)
Calcium: 9.2 mg/dL (ref 8.9–10.3)
Chloride: 102 mmol/L (ref 98–111)
Creatinine, Ser: 1.55 mg/dL — ABNORMAL HIGH (ref 0.61–1.24)
GFR calc Af Amer: 47 mL/min — ABNORMAL LOW (ref >60)
GFR calc non Af Amer: 41 mL/min — ABNORMAL LOW (ref >60)
Glucose, Bld: 104 mg/dL — ABNORMAL HIGH (ref 70–99)
Potassium: 4.3 mmol/L (ref 3.5–5.1)
Sodium: 135 mmol/L (ref 135–145)

## 2020-03-11 LAB — MAGNESIUM: Magnesium: 2.1 mg/dL (ref 1.7–2.4)

## 2020-03-11 MED ORDER — FINASTERIDE 5 MG PO TABS: 5.0000 mg | ORAL_TABLET | Freq: Every day | ORAL | Status: AC

## 2020-03-11 MED ORDER — QUETIAPINE FUMARATE 25 MG PO TABS: 12.5000 mg | ORAL_TABLET | Freq: Every day | ORAL | Status: AC

## 2020-03-11 MED ORDER — ENSURE ENLIVE PO LIQD: 237.0000 mL | Freq: Three times a day (TID) | ORAL | 12 refills | Status: DC

## 2020-03-11 MED ORDER — ENSURE ENLIVE PO LIQD: 237.0000 mL | Freq: Every day | ORAL | Status: DC

## 2020-03-11 NOTE — Discharge Summary (Addendum)
Physician Discharge Summary  Christian Warren. ZOX:[id number] DOB: [birth year] DOA: 03/03/2020  PCP: Retia Passe, NP  Admit date: 03/03/2020 Discharge date: 03/11/2020  Admitted From: SNF Disposition:  SNF   Recommendations for Outpatient Follow-up:  1. Needs f/u appointment with urology due to BOO 2. Will need close monitoring to be sure he does not interfere with the foley-the foley is not the best option for him and hopefully urology can help 3. Recommend Ensure TID immediately after a meal and one about 1 hr before betime 4. Amlodipine on hold as BP has not been high   Discharge Condition:  stable   CODE STATUS:  DNR   Diet recommendation:  Regular diet with Ensure supplements as mentioned Consultations:  none  Procedures/Studies: . Foley cath   Discharge Diagnoses:  Principal Problem:   Sepsis (HCC) Active Problems:   BPH (benign prostatic hyperplasia)/ BOO   Essential hypertension   Dehydration    AKI (acute kidney injury) (HCC)   CKD (chronic kidney disease) stage 3, GFR 30-59 ml/min (HCC) Palliative care by specialist   Goals of care, counseling/discussion   DNR (do not resuscitate)   HOH (hard of hearing)   Mild dementia (HCC)     Brief Summary: Christian Warren. 86 year old male with history of hypertension, BPH, severely hard of hearing, dementia with a recent hospitalization for rhabdomyolysis and AKI (from home) with subsequent discharge to nursing home was found unresponsive at nursing home. On presentation, initial blood pressure was 80/50 which improved to 116/60 after 200 cc normal saline bolus following which patient became more awake and responded to questions but was still confused. He was found to have temperature of 101.6. Chest x-ray showed no acute infiltrates. UA was positive. WBC of 16, creatinine 6.1, bicarb of 14. Bladder scan was showing more than 700 cc of urine, Foley catheter was inserted. Nephrology was curb sided who recommended aggressive  hydration and repeat renal ultrasound and if no improvement in renal function, to consult nephrology officially.   Hospital Course:  Active Problems:   Severe Sepsis related to BOO and acute pyelonephritis - improving with antibiotics - cefepime> Keflex-completed 7 of treatment on 10/2 - no growth on urine culture but specimen may have been taken after antibiotics started - blood culture > anaerobic gram + rods in 1 bottle- repeat cultures negative and thus likely a contminant - mild b/l hydronephrosis on renal ultrasound- cont foley - he is already on Flomax BID- Proscar started here  - with current BOO, will need referral to urology as outpt and will need to continue the foley cath until the follow up visit- niece in agreement  AKI- CKD 2-3b - due to poor oral intake as outpatient and BOO - cr 6.11 now improved to 1.55 - baseline is ~ 1.2-1.3 with a GFR in high 50s   Poor oral intake- weight loss - per RN, he is eating < 50 % of meals   - have asked for nutrition eval, addition of supplements and a calorie count - Ensure started - his niece who is his next of kin states he has always "eaten like a bird". When last weighed at his PCPs office he was 200 lb- he is about 190 now - he underwent a calorie count- he is still eating anywhere from 0 to 50% of meals and does insist on feeding himself - We discovered that he enjoys ensure and will drink the entire container- I recommend 1 container immediately after each meal and  one at bedtime. This should prevent weight loss and dehydration  Mild Dementia with acute metabolic encephalopathy due to acute infection - now alert and oriented  - his niece, Andee Poles, is the point of contact and I have been in touch with her  Severely hard of hearing  Remote lacunar infarcts on CT head    Discharge Exam: Vitals:   03/11/20 0827 03/11/20 1116  BP: 122/88 93/61  Pulse:    Resp: 18 18  Temp: (!) 97.5 F (36.4 C) (!) 97.5 F (36.4  C)  SpO2:     Vitals:   03/10/20 0700 03/10/20 1633 03/11/20 0827 03/11/20 1116  BP: 120/69 120/72 122/88 93/61  Pulse: 90 92    Resp: 16 18 18 18   Temp: 97.9 F (36.6 C) 98.2 F (36.8 C) (!) 97.5 F (36.4 C) (!) 97.5 F (36.4 C)  TempSrc: Axillary Oral Oral Oral  SpO2: 96% 96%    Weight:      Height:        General: Pt is alert, awake, not in acute distress Cardiovascular: RRR, S1/S2 +, no rubs, no gallops Respiratory: CTA bilaterally, no wheezing, no rhonchi Abdominal: Soft, NT, ND, bowel sounds + Extremities: no edema, no cyanosis   Discharge Instructions   Allergies as of 03/11/2020      Reactions   Orange Juice [orange Oil] Itching      Medication List    STOP taking these medications   amLODipine 10 MG tablet Commonly known as: NORVASC   zolpidem 5 MG tablet Commonly known as: AMBIEN     TAKE these medications   acetaminophen 325 MG tablet Commonly known as: TYLENOL Take 650 mg by mouth every 6 (six) hours as needed for mild pain, fever or headache.   aspirin EC 81 MG tablet Take 81 mg by mouth daily.   feeding supplement (ENSURE ENLIVE) Liqd Take 237 mLs by mouth 3 (three) times daily after meals. Immediately after a meal. No strawberry   finasteride 5 MG tablet Commonly known as: PROSCAR Take 1 tablet (5 mg total) by mouth daily. Start taking on: March 12, 2020   omeprazole 20 MG capsule Commonly known as: PRILOSEC Take 20 mg by mouth at bedtime.   polyethylene glycol 17 g packet Commonly known as: MIRALAX / GLYCOLAX Take 17 g by mouth daily.   QUEtiapine 25 MG tablet Commonly known as: SEROQUEL Take 0.5 tablets (12.5 mg total) by mouth at bedtime. What changed: how much to take   tamsulosin 0.4 MG Caps capsule Commonly known as: FLOMAX Take 0.4 mg by mouth 2 (two) times daily.       Allergies  Allergen Reactions  . Orange Juice [Orange Oil] Itching      CT Head Wo Contrast  Result Date: 03/03/2020 CLINICAL DATA:   Mental status change. EXAM: CT HEAD WITHOUT CONTRAST TECHNIQUE: Contiguous axial images were obtained from the base of the skull through the vertex without intravenous contrast. COMPARISON:  CT 02/04/2020 FINDINGS: Brain: Similar advanced patchy white matter hypoattenuation, compatible with the sequela of chronic microvascular ischemic disease. Similar remote lacunar infarcts involving the right caudate and possibly in the right frontal white matter. No evidence of acute large vascular territory infarct. No acute hemorrhage. No abnormal mass lesion or extra-axial collection. Similar moderate diffuse cerebral atrophy with ex vacuo ventricular dilation. Partially empty sella. Vascular: Calcific atherosclerosis. Skull: Normal. Negative for fracture or focal lesion. Sinuses/Orbits: No substantial paranasal sinus disease. No acute orbital abnormality. Other: No mastoid effusion.  IMPRESSION: 1. No evidence of acute intracranial abnormality. 2. Similar diffuse cerebral atrophy, chronic microvascular ischemic disease, and remote lacunar infarcts. Electronically Signed   By: Feliberto Harts MD   On: 03/03/2020 12:01   US RENAL  Result Date: 03/04/2020 CLINICAL DATA:  Acute kidney injury EXAM: RENAL / URINARY TRACT ULTRASOUND COMPLETE COMPARISON:  03/03/2020 FINDINGS: Right Kidney: Renal measurements: 10 x 4.1 x 4.6 cm = volume: 98.6 mL. Again noted is mild hydronephrosis. This appears improved from prior study. Left Kidney: Renal measurements: 11.7 x 4.9 x 5 cm = volume: 147 mL. Again noted is mild hydronephrosis that appears slightly improved from prior study. Bladder: The bladder is decompressed with a Foley catheter. Despite the degree of underdistention, the bladder wall appears to be thickened which may be secondary to chronic outlet obstruction. Other: The prostate gland is significantly enlarged measuring 7 x 4.8 cm. IMPRESSION: 1. Persistent but improved mild bilateral hydronephrosis, left worse than right. 2.  Urinary bladder decompressed by Foley catheter. There appears to be diffuse bladder wall thickening despite the degree of underdistention which can be seen in patients with chronic outlet obstruction. 3. Prostatomegaly. Electronically Signed   By: Katherine Mantle M.D.   On: 03/04/2020 17:39   US RENAL  Result Date: 03/03/2020 CLINICAL DATA:  AKI EXAM: RENAL / URINARY TRACT ULTRASOUND COMPLETE COMPARISON:  02/04/2020 CT abdomen pelvis and prior. 07/07/2014 renal ultrasound. FINDINGS: Right Kidney: Renal measurements: 11.2 x 4.7 x 6.2 cm = volume: 169.4 mL. Echogenicity within normal limits. No mass. Mild hydronephrosis visualized. Left Kidney: Renal measurements: 12.8 x 5.8 x 6.4 cm = volume: 249.8 mL. Echogenicity within normal limits. No mass. Mild hydronephrosis visualized. Bladder: Appears normal for degree of bladder distention. Indwelling Foley catheter. Other: None. IMPRESSION: Mild bilateral hydronephrosis. Parenchymal echogenicity within normal limits. Indwelling Foley catheter. Electronically Signed   By: Stana Bunting M.D.   On: 03/03/2020 15:41   DG Chest Portable 1 View  Result Date: 03/03/2020 CLINICAL DATA:  Altered mental status.  Code sepsis. EXAM: PORTABLE CHEST 1 VIEW COMPARISON:  Chest radiograph and CT 02/04/2020. FINDINGS: Mild enlargement the cardiac silhouette, similar to prior. Aortic atherosclerosis. Linear bibasilar opacities, favor atelectasis. No focal consolidation. No visible pleural effusions. No pneumothorax. The visualized skeletal structures are unremarkable. IMPRESSION: Linear bibasilar opacities, favor atelectasis. Electronically Signed   By: Feliberto Harts MD   On: 03/03/2020 11:56     The results of significant diagnostics from this hospitalization (including imaging, microbiology, ancillary and laboratory) are listed below for reference.     Microbiology: Recent Results (from the past 240 hour(s))  Blood culture (routine x 2)     Status: Abnormal    Collection Time: 03/03/20 11:20 AM   Specimen: BLOOD  Result Value Ref Range Status   Specimen Description BLOOD RIGHT ANTECUBITAL  Final   Special Requests   Final    BOTTLES DRAWN AEROBIC AND ANAEROBIC Blood Culture results may not be optimal due to an inadequate volume of blood received in culture bottles   Culture  Setup Time   Final    GRAM POSITIVE RODS ANAEROBIC BOTTLE ONLY CRITICAL RESULT CALLED TO, READ BACK BY AND VERIFIED WITH: Carmell Austria 1610 03/05/2020 Girtha Hake Performed at [facility], [address] [place], [place] [address]    Culture ANAEROBIC GRAM POSITIVE RODS Actinotignum schaalii (A)  Final   Report Status 03/08/2020 FINAL  Final  Urine culture     Status: None   Collection Time: 03/03/20  1:49 PM   Specimen: Urine, Random  Result Value Ref Range Status   Specimen Description URINE, RANDOM  Final   Special Requests NONE  Final   Culture   Final    NO GROWTH Performed at [facility], [address], [place], [place] [address]    Report Status 03/04/2020 FINAL  Final  Respiratory Panel by RT PCR (Flu A&B, Covid) - Nasopharyngeal Swab     Status: None   Collection Time: 03/03/20  2:30 PM   Specimen: Nasopharyngeal Swab  Result Value Ref Range Status   SARS Coronavirus 2 by RT PCR NEGATIVE NEGATIVE Final    Comment: (NOTE) SARS-CoV-2 target nucleic acids are NOT DETECTED.  The SARS-CoV-2 RNA is generally detectable in upper respiratoy specimens during the acute phase of infection. The lowest concentration of SARS-CoV-2 viral copies this assay can detect is 131 copies/mL. A negative result does not preclude SARS-Cov-2 infection and should not be used as the sole basis for treatment or other patient management decisions. A negative result may occur with  improper specimen collection/handling, submission of specimen other than nasopharyngeal swab, presence of viral mutation(s) within the areas targeted by this assay, and inadequate number of  viral copies (<131 copies/mL). A negative result must be combined with clinical observations, patient history, and epidemiological information. The expected result is Negative.  Fact Sheet for Patients:  https://www.moore.com/  Fact Sheet for Healthcare Providers:  https://www.young.biz/  This test is no t yet approved or cleared by the [place] FDA and  has been authorized for detection and/or diagnosis of SARS-CoV-2 by FDA under an Emergency Use Authorization (EUA). This EUA will remain  in effect (meaning this test can be used) for the duration of the COVID-19 declaration under Section 564(b)(1) of the Act, 21 U.S.C. section 360bbb-3(b)(1), unless the authorization is terminated or revoked sooner.     Influenza A by PCR NEGATIVE NEGATIVE Final   Influenza B by PCR NEGATIVE NEGATIVE Final    Comment: (NOTE) The Xpert Xpress SARS-CoV-2/FLU/RSV assay is intended as an aid in  the diagnosis of influenza from Nasopharyngeal swab specimens and  should not be used as a sole basis for treatment. Nasal washings and  aspirates are unacceptable for Xpert Xpress SARS-CoV-2/FLU/RSV  testing.  Fact Sheet for Patients: https://www.moore.com/  Fact Sheet for Healthcare Providers: https://www.young.biz/  This test is not yet approved or cleared by the [place] FDA and  has been authorized for detection and/or diagnosis of SARS-CoV-2 by  FDA under an Emergency Use Authorization (EUA). This EUA will remain  in effect (meaning this test can be used) for the duration of the  Covid-19 declaration under Section 564(b)(1) of the Act, 21  U.S.C. section 360bbb-3(b)(1), unless the authorization is  terminated or revoked. Performed at [facility], [address] [place], [place] [address]   Blood culture (routine x 2)     Status: None   Collection Time: 03/03/20 10:04 PM   Specimen: BLOOD  Result  Value Ref Range Status   Specimen Description BLOOD RIGHT THUMB  Final   Special Requests   Final    BOTTLES DRAWN AEROBIC ONLY Blood Culture results may not be optimal due to an inadequate volume of blood received in culture bottles   Culture   Final    NO GROWTH 5 DAYS Performed at [facility], [address] [place], [place] [address]    Report Status 03/08/2020 FINAL  Final  Culture, blood (routine  x 2)     Status: None (Preliminary result)   Collection Time: 03/07/20  2:17 PM   Specimen: BLOOD  Result Value Ref Range Status   Specimen Description BLOOD LEFT ANTECUBITAL  Final   Special Requests   Final    BOTTLES DRAWN AEROBIC AND ANAEROBIC Blood Culture adequate volume   Culture   Final    NO GROWTH 4 DAYS Performed at [facility], [address], [place], [place] [address]    Report Status PENDING  Incomplete  Culture, blood (routine x 2)     Status: None (Preliminary result)   Collection Time: 03/07/20  2:20 PM   Specimen: BLOOD  Result Value Ref Range Status   Specimen Description BLOOD RIGHT ANTECUBITAL  Final   Special Requests   Final    BOTTLES DRAWN AEROBIC AND ANAEROBIC Blood Culture adequate volume   Culture   Final    NO GROWTH 4 DAYS Performed at [facility], [address], [place], [place] [address]    Report Status PENDING  Incomplete     Labs: BNP (last 3 results) No results for input(s): BNP in the last 8760 hours. Basic Metabolic Panel: Recent Labs  Lab 03/06/20 1015 03/06/20 1015 03/07/20 1478 03/08/20 0408 03/09/20 0803 03/09/20 1155 03/10/20 0259 03/11/20 0215  NA 140   < > 140 137 136  --  136 135  K 3.3*   < > 3.9 3.9 3.1*  --  3.9 4.3  CL 107   < > 107 107 105  --  104 102  CO2 22   < > 23 18* 22  --  23 24  GLUCOSE 130*   < > 109* 88 91  --  95 104*  BUN 41*   < > 41* 43* 29*  --  25* 33*  CREATININE 2.33*   < > 2.36* 2.43* 1.92*  --  1.76* 1.55*  CALCIUM 8.3*   < > 8.9 8.7* 8.7*  --  9.0 9.2  MG 1.5*  --   2.1  --   --  1.4* 1.3* 2.1   < > = values in this interval not displayed.   Liver Function Tests: Recent Labs  Lab 03/05/20 0220  AST 24  ALT 19  ALKPHOS 78  BILITOT 0.5  PROT 5.6*  ALBUMIN 2.6*   No results for input(s): LIPASE, AMYLASE in the last 168 hours. Recent Labs  Lab 03/05/20 0220  AMMONIA 24   CBC: Recent Labs  Lab 03/05/20 0220 03/05/20 0220 03/06/20 1015 03/06/20 1015 03/07/20 2956 03/08/20 0408 03/09/20 0803 03/10/20 0259 03/11/20 0215  WBC 7.7   < > 5.4   < > 7.9 6.8 4.4 4.0 4.6  NEUTROABS 4.8  --  2.8  --  4.8  --   --   --   --   HGB 10.6*   < > 10.2*   < > 11.9* 10.1* 9.8* 9.6* 9.5*  HCT 30.3*   < > 30.6*   < > 36.5* 31.4* 30.0* 29.5* 28.9*  MCV 94.4   < > 96.8   < > 98.6 98.7 97.7 98.0 97.6  PLT 135*   < > 151   < > 186 146* 184 183 206   < > = values in this interval not displayed.   Cardiac Enzymes: No results for input(s): CKTOTAL, CKMB, CKMBINDEX, TROPONINI in the last 168 hours. BNP: Invalid input(s): POCBNP CBG: No results for input(s): GLUCAP in the last 168  hours. D-Dimer No results for input(s): DDIMER in the last 72 hours. Hgb A1c No results for input(s): HGBA1C in the last 72 hours. Lipid Profile No results for input(s): CHOL, HDL, LDLCALC, TRIG, CHOLHDL, LDLDIRECT in the last 72 hours. Thyroid function studies No results for input(s): TSH, T4TOTAL, T3FREE, THYROIDAB in the last 72 hours.  Invalid input(s): FREET3 Anemia work up No results for input(s): VITAMINB12, FOLATE, FERRITIN, TIBC, IRON, RETICCTPCT in the last 72 hours. Urinalysis    Component Value Date/Time   COLORURINE YELLOW 03/08/2020 1722   APPEARANCEUR CLEAR 03/08/2020 1722   LABSPEC 1.008 03/08/2020 1722   PHURINE 6.0 03/08/2020 1722   GLUCOSEU NEGATIVE 03/08/2020 1722   HGBUR MODERATE (A) 03/08/2020 1722   BILIRUBINUR NEGATIVE 03/08/2020 1722   KETONESUR NEGATIVE 03/08/2020 1722   PROTEINUR NEGATIVE 03/08/2020 1722   NITRITE NEGATIVE 03/08/2020 1722    LEUKOCYTESUR MODERATE (A) 03/08/2020 1722   Sepsis Labs Invalid input(s): PROCALCITONIN,  WBC,  LACTICIDVEN Microbiology Recent Results (from the past 240 hour(s))  Blood culture (routine x 2)     Status: Abnormal   Collection Time: 03/03/20 11:20 AM   Specimen: BLOOD  Result Value Ref Range Status   Specimen Description BLOOD RIGHT ANTECUBITAL  Final   Special Requests   Final    BOTTLES DRAWN AEROBIC AND ANAEROBIC Blood Culture results may not be optimal due to an inadequate volume of blood received in culture bottles   Culture  Setup Time   Final    GRAM POSITIVE RODS ANAEROBIC BOTTLE ONLY CRITICAL RESULT CALLED TO, READ BACK BY AND VERIFIED WITH: Carmell AustriaL. CHEN,PHARMD [id number] 03/05/2020 Girtha Hake. TYSOR Performed at [facility], [address] [place], [place] [address]    Culture ANAEROBIC GRAM POSITIVE RODS Actinotignum schaalii (A)  Final   Report Status 03/08/2020 FINAL  Final  Urine culture     Status: None   Collection Time: 03/03/20  1:49 PM   Specimen: Urine, Random  Result Value Ref Range Status   Specimen Description URINE, RANDOM  Final   Special Requests NONE  Final   Culture   Final    NO GROWTH Performed at [facility], [address] [place], [place] [address]    Report Status 03/04/2020 FINAL  Final  Respiratory Panel by RT PCR (Flu A&B, Covid) - Nasopharyngeal Swab     Status: None   Collection Time: 03/03/20  2:30 PM   Specimen: Nasopharyngeal Swab  Result Value Ref Range Status   SARS Coronavirus 2 by RT PCR NEGATIVE NEGATIVE Final    Comment: (NOTE) SARS-CoV-2 target nucleic acids are NOT DETECTED.  The SARS-CoV-2 RNA is generally detectable in upper respiratoy specimens during the acute phase of infection. The lowest concentration of SARS-CoV-2 viral copies this assay can detect is 131 copies/mL. A negative result does not preclude SARS-Cov-2 infection and should not be used as the sole basis for treatment or other patient management decisions. A  negative result may occur with  improper specimen collection/handling, submission of specimen other than nasopharyngeal swab, presence of viral mutation(s) within the areas targeted by this assay, and inadequate number of viral copies (<131 copies/mL). A negative result must be combined with clinical observations, patient history, and epidemiological information. The expected result is Negative.  Fact Sheet for Patients:  https://www.moore.com/https://www.fda.gov/media/142436/download  Fact Sheet for Healthcare Providers:  https://www.young.biz/https://www.fda.gov/media/142435/download  This test is no t yet approved or cleared by the [place] FDA and  has been authorized for detection and/or diagnosis of  SARS-CoV-2 by FDA under an Emergency Use Authorization (EUA). This EUA will remain  in effect (meaning this test can be used) for the duration of the COVID-19 declaration under Section 564(b)(1) of the Act, 21 U.S.C. section 360bbb-3(b)(1), unless the authorization is terminated or revoked sooner.     Influenza A by PCR NEGATIVE NEGATIVE Final   Influenza B by PCR NEGATIVE NEGATIVE Final    Comment: (NOTE) The Xpert Xpress SARS-CoV-2/FLU/RSV assay is intended as an aid in  the diagnosis of influenza from Nasopharyngeal swab specimens and  should not be used as a sole basis for treatment. Nasal washings and  aspirates are unacceptable for Xpert Xpress SARS-CoV-2/FLU/RSV  testing.  Fact Sheet for Patients: https://www.moore.com/  Fact Sheet for Healthcare Providers: https://www.young.biz/  This test is not yet approved or cleared by the [place] FDA and  has been authorized for detection and/or diagnosis of SARS-CoV-2 by  FDA under an Emergency Use Authorization (EUA). This EUA will remain  in effect (meaning this test can be used) for the duration of the  Covid-19 declaration under Section 564(b)(1) of the Act, 21  U.S.C. section 360bbb-3(b)(1), unless the authorization  is  terminated or revoked. Performed at [facility], [address] [place], [place] [address]   Blood culture (routine x 2)     Status: None   Collection Time: 03/03/20 10:04 PM   Specimen: BLOOD  Result Value Ref Range Status   Specimen Description BLOOD RIGHT THUMB  Final   Special Requests   Final    BOTTLES DRAWN AEROBIC ONLY Blood Culture results may not be optimal due to an inadequate volume of blood received in culture bottles   Culture   Final    NO GROWTH 5 DAYS Performed at [facility], [address] [place], [place] [address]    Report Status 03/08/2020 FINAL  Final  Culture, blood (routine x 2)     Status: None (Preliminary result)   Collection Time: 03/07/20  2:17 PM   Specimen: BLOOD  Result Value Ref Range Status   Specimen Description BLOOD LEFT ANTECUBITAL  Final   Special Requests   Final    BOTTLES DRAWN AEROBIC AND ANAEROBIC Blood Culture adequate volume   Culture   Final    NO GROWTH 4 DAYS Performed at [facility], [address], [place], [place] [address]    Report Status PENDING  Incomplete  Culture, blood (routine x 2)     Status: None (Preliminary result)   Collection Time: 03/07/20  2:20 PM   Specimen: BLOOD  Result Value Ref Range Status   Specimen Description BLOOD RIGHT ANTECUBITAL  Final   Special Requests   Final    BOTTLES DRAWN AEROBIC AND ANAEROBIC Blood Culture adequate volume   Culture   Final    NO GROWTH 4 DAYS Performed at [facility], [address] [place], [place] [address]    Report Status PENDING  Incomplete     Time coordinating discharge in minutes: 65  SIGNED:   Calvert Cantor, MD  [facility] 03/11/2020, 2:23 PM

## 2020-03-11 NOTE — Progress Notes (Signed)
Report given to the nurse, Ardeth Perfect at [facility].  Hinton Dyer, RN

## 2020-03-11 NOTE — Discharge Instructions (Signed)
Acute Urinary Retention, Male  Acute urinary retention is a condition in which a person is unable to pass urine. This can last for a short time or for a long time. If left untreated, it can result in kidney damage or other serious complications. What are the causes? This condition may be caused by:  Obstruction or narrowing of the tube that drains the bladder (urethra). This may be caused by surgery or problems with nearby organs, such as the prostate gland, which can press or squeeze the urethra.  Problems with the nerves in the bladder. These can be caused by diseases, such as multiple sclerosis, or by spinal cord injuries.  Certain medicines.  Tumors in the area of the pelvis, bladder, or urethra.  Diabetes.  Degenerative cognitive conditions such as delirium or dementia.  Bladder or urinary tract infection.  Constipation.  Blood in the urine (hematuria).  Injury to the bladder or urethra.  Psychological (psychogenic) conditions. Someone may hold his urine due to trauma or because he does not want to use the bathroom. What increases the risk? This condition is more likely to develop in older men. As men age, their prostate may become larger and may start pressing or squeezing on the bladder or the urethra. What are the signs or symptoms? Symptoms of this condition include:  Trouble urinating.  Pain in the lower abdomen. Symptoms usually come on slowly over a long period of time. How is this diagnosed? This condition is diagnosed based on a physical exam and a medical history. You may also have other tests, including:  An ultrasound of the bladder or kidneys or both.  Blood tests.  A urine analysis.  Additional tests may be needed such as an MRI, kidney, or bladder function tests. How is this treated? Treatment for this condition may include:  Medicines.  Placing a thin, sterile tube (catheter) into the bladder to drain urine out of the body. This is called an  indwelling urinary catheter. After being inserted, the catheter is held in place with a small balloon that is filled with sterile water. Urine drains from the catheter into a collection bag outside of the body.  Behavioral therapy.  Treatment for any underlying conditions.  If needed, you may be treated in the hospital for kidney function problems or to manage other complications. Follow these instructions at home:  Take over-the-counter and prescription medicines only as told by your health care provider. Avoid certain medicines, such as decongestants, antihistamines, and some prescription medicines. Do not take any medicine unless your health care provider has approved.  If you were given an indwelling urinary catheter, take care of it as told by your health care provider.  Drink enough fluid to keep your urine clear or pale yellow.  If you were prescribed an antibiotic, take it as told by your health care provider. Do not stop taking the antibiotic even if you start to feel better.  Do not use any products that contain nicotine or tobacco, such as cigarettes and e-cigarettes. If you need help quitting, ask your health care provider.  Monitor any changes in your symptoms. Tell your health care provider about any changes.  If instructed, monitor your blood pressure at home. Report changes as told by your health care provider.  Keep all follow-up visits as told by your health care provider. This is important. Contact a health care provider if:  You have uncomfortable bladder contractions that you cannot control (spasms) or you leak urine with the spasms.   Get help right away if:  You have chills or fever.  You have blood in your urine.  You have a catheter and: ? Your catheter stops draining urine. ? Your catheter falls out. Summary  Acute urinary retention is a condition in which a person is unable to pass urine. If left untreated, it can result in kidney damage or other serious  complications.  The cause of this condition may include an enlarged prostate. As men age, their prostate gland may become larger and may start pressing or squeezing on the bladder or the urethra.  Treatment for this condition may include medicines and placement of an indwelling urinary catheter.  Monitor any changes in your symptoms. Tell your health care provider about any changes. This information is not intended to replace advice given to you by your health care provider. Make sure you discuss any questions you have with your health care provider. Document Revised: 05/08/2017 Document Reviewed: 06/27/2016 Elsevier Patient Education  2020 Elsevier Inc.  

## 2020-03-11 NOTE — TOC Transition Note (Addendum)
Transition of Care [facility]) - CM/SW Discharge Note   Patient Details  Name: Christian Warren. MRN: [id number] Date of Birth: 05-12-1933  Transition of Care [facility]) CM/SW Contact:  Christian Paradise, LCSW Phone Number: [phone] 03/11/2020, 4:37 PM   Clinical Narrative:    Patient will DC to:?[facility] Anticipated DC date:?03/11/20 Family notified:?[facility] Transport by: Sharin Mons   Per MD patient ready for DC to [facility]. RN, patient, patient's family, and facility notified of DC. Discharge Summary sent to facility. RN given number for report [phone] room 122B. DC packet on chart. Ambulance transport requested for patient by RN once COVID results are back.   CSW signing off.   Christian Warren, [place] [phone]    Final next level of care: Skilled Nursing Facility Barriers to Discharge: Barriers Resolved   Patient Goals and CMS Choice   CMS Medicare.gov Compare Post Acute Care list provided to:: Patient Represenative (must comment) Christian Warren) Choice offered to / list presented to :  Christian Warren)  Discharge Placement              Patient chooses bed at:  [facility]) Patient to be transferred to facility by: [facility] Name of family member notified: Christian Warren Patient and family notified of of transfer: 03/11/20  Discharge Plan and Services                                     Social Determinants of Health (SDOH) Interventions     Readmission Risk Interventions No flowsheet data found.

## 2020-03-11 NOTE — TOC Progression Note (Addendum)
Transition of Care (TOC) - Progression Note    Patient Details  Name: Christian Warren. MRN: [id number] Date of Birth: [birth year]  Transition of [facility]) CM/SW Contact  Patrice Paradise, [place] Phone Number:  [phone] 03/11/2020, 11:47 AM  Clinical Narrative:     CSW was alerted that patient was medically stable for discharge. CSW followed up with [facility] and had to leave a message. MD updated about awaiting a call back.  1:20pm- Facility called back and can accept patient. Patient will need updated COVID test. MD alerted.  TOC team will continue to assist with discharge planning needs.   Expected Discharge Plan: Skilled Nursing Facility Barriers to Discharge: Continued Medical Work up  Expected Discharge Plan and Services Expected Discharge Plan: Skilled Nursing Facility       Living arrangements for the past 2 months:  (came from [facility] short term)                                       Social Determinants of Health (SDOH) Interventions    Readmission Risk Interventions No flowsheet data found.

## 2020-03-12 LAB — CULTURE, BLOOD (ROUTINE X 2)
Culture: NO GROWTH
Culture: NO GROWTH
Special Requests: ADEQUATE
Special Requests: ADEQUATE

## 2020-03-15 ENCOUNTER — Emergency Department (HOSPITAL_COMMUNITY)
Admission: EM | Admit: 2020-03-15 | Discharge: 2020-03-16 | Disposition: A | Discharge status: Other Acute Inpatient Hospital | Payer: Medicare HMO | Attending: Emergency Medicine | Admitting: Emergency Medicine

## 2020-03-15 ENCOUNTER — Encounter (HOSPITAL_COMMUNITY): Payer: Self-pay

## 2020-03-15 ENCOUNTER — Other Ambulatory Visit: Payer: Self-pay

## 2020-03-15 DIAGNOSIS — R339 Retention of urine, unspecified: Secondary | ICD-10-CM | POA: Insufficient documentation

## 2020-03-15 DIAGNOSIS — N183 Chronic kidney disease, stage 3 unspecified: Secondary | ICD-10-CM | POA: Insufficient documentation

## 2020-03-15 DIAGNOSIS — Z7982 Long term (current) use of aspirin: Secondary | ICD-10-CM | POA: Diagnosis not present

## 2020-03-15 DIAGNOSIS — I129 Hypertensive chronic kidney disease with stage 1 through stage 4 chronic kidney disease, or unspecified chronic kidney disease: Secondary | ICD-10-CM | POA: Insufficient documentation

## 2020-03-15 NOTE — ED Provider Notes (Signed)
[facility]-EMERGENCY DEPT Provider Note   CSN: [id number] Arrival date & time: 03/15/20  2149     History Chief Complaint  Patient presents with  . Urinary Retention    Christian Warren. is a 86 y.o. male.  Patient is an 86 year old male with dementia who lives in memory care who has a history of bladder outflow obstruction and requires Foley catheter.  He also has a history of stage III kidney disease.  Patient is being brought to the emergency department today from his facility because he pulled out his catheter earlier today and would not let them replace it.  However he is not been able to urinate all day long.  Patient is laying in the bed and has no particular complaints but does have some mild abdominal discomfort.  Last time he voided per paramedics was at least 8 hours ago.  He has no other complaints at this time.  The history is provided by the patient, the EMS personnel and the nursing home. The history is limited by the absence of a caregiver.       Past Medical History:  Diagnosis Date  . Arthritis   . Hypertension     Patient Active Problem List   Diagnosis Date Noted  . AKI (acute kidney injury) (HCC) 03/11/2020  . CKD (chronic kidney disease) stage 3, GFR 30-59 ml/min (HCC) 03/11/2020  . HOH (hard of hearing) 03/11/2020  . Mild dementia (HCC) 03/11/2020  . Bladder outflow obstruction 03/11/2020  . Palliative care by specialist   . Goals of care, counseling/discussion   . DNR (do not resuscitate)   . Sepsis (HCC) 03/03/2020  . Urinary retention   . Altered mental status   . Dehydration 02/04/2020  . Arthritis 12/30/2018  . Hyperlipidemia 12/30/2018  . Hypertension 12/30/2018  . Essential hypertension 12/30/2018  . Syncope and collapse 12/30/2018  . BPH (benign prostatic hyperplasia) 04/10/2017  . Chronic right shoulder pain 04/24/2015  . Memory impairment 04/24/2015  . Tremors of nervous system 04/24/2015  . Seizures (HCC)  12/21/2014    History reviewed. No pertinent surgical history.     No family history on file.  Social History   Tobacco Use  . Smoking status: Never Smoker  . Smokeless tobacco: Current User    Types: Chew  Vaping Use  . Vaping Use: Never used  Substance Use Topics  . Alcohol use: Yes    Alcohol/week: 3.0 standard drinks    Types: 3 Cans of beer per week  . Drug use: Never    Home Medications Prior to Admission medications   Medication Sig Start Date End Date Taking? Authorizing Provider  acetaminophen (TYLENOL) 325 MG tablet Take 650 mg by mouth every 6 (six) hours as needed for mild pain, fever or headache.    [provider]  aspirin EC 81 MG tablet Take 81 mg by mouth daily.     [provider]  feeding supplement, ENSURE ENLIVE, (ENSURE ENLIVE) LIQD Take 237 mLs by mouth 3 (three) times daily after meals. Immediately after a meal. No strawberry 03/11/20   Calvert Cantor, MD  finasteride (PROSCAR) 5 MG tablet Take 1 tablet (5 mg total) by mouth daily. 03/12/20   Calvert Cantor, MD  omeprazole (PRILOSEC) 20 MG capsule Take 20 mg by mouth at bedtime.    [provider]  polyethylene glycol (MIRALAX / GLYCOLAX) 17 g packet Take 17 g by mouth daily.    [provider]  QUEtiapine (SEROQUEL) 25  MG tablet Take 0.5 tablets (12.5 mg total) by mouth at bedtime. 03/11/20   Calvert Cantor, MD  tamsulosin (FLOMAX) 0.4 MG CAPS capsule Take 0.4 mg by mouth 2 (two) times daily.  Patient not taking: Reported on 03/03/2020 11/30/14   [provider]    Allergies    Orange juice Erskine Emery oil]  Review of Systems   Review of Systems  Unable to perform ROS: Dementia    Physical Exam Updated Vital Signs BP 94/66 (BP Location: Left Arm)   Pulse 67   Temp 98.5 F (36.9 C) (Oral)   Resp 18   Ht 6' (1.829 m)   Wt 85 kg   SpO2 100%   BMI 25.41 kg/m   Physical Exam Vitals reviewed.  Constitutional:      General: He is not in acute distress.     Appearance: Normal appearance. He is normal weight.  HENT:     Head: Normocephalic and atraumatic.  Eyes:     Pupils: Pupils are equal, round, and reactive to light.  Cardiovascular:     Rate and Rhythm: Normal rate.  Pulmonary:     Effort: Pulmonary effort is normal.  Abdominal:     Comments: Mild tenderness in the suprapubic area and palpable mass present to the umbilicus  Musculoskeletal:     Right lower leg: No edema.     Left lower leg: No edema.  Skin:    General: Skin is warm and dry.  Neurological:     Mental Status: He is alert. Mental status is at baseline.  Psychiatric:     Comments: Calm and cooperative     ED Results / Procedures / Treatments   Labs (all labs ordered are listed, but only abnormal results are displayed) Labs Reviewed - No data to display  EKG None  Radiology No results found.  Procedures Procedures (including critical care time)  Medications Ordered in ED Medications - No data to display  ED Course  I have reviewed the triage vital signs and the nursing notes.  Pertinent labs & imaging results that were available during my care of the patient were reviewed by me and considered in my medical decision making (see chart for details).    MDM Rules/Calculators/A&P                          Patient presenting today with urinary retention.  Patient is supposed to have a Foley catheter for bladder outlet obstruction from BPH.  Patient has a history of dementia and lives at a memory care unit.  He pulled out his Foley catheter today and refused to let the facility replace it.  However he has not urinated in over 8 hours and on bladder scan today it is greater than 1000 mL.  Patient has some mild tenderness and palpable mass in the suprapubic area.  Discussed with patient that he needed to have the catheter placed because he could not pee.  He consents for catheter placement.  No signs or symptoms consistent with infection at this time.  Will ensure  bladder drainage.  of urine drained and pt remained stable to d/ced backto facility  Final Clinical Impression(s) / ED Diagnoses Final diagnoses:  Urinary retention    Rx / DC Orders ED Discharge Orders    None       Gwyneth Sprout, MD 03/19/20 [phone]

## 2020-03-15 NOTE — ED Triage Notes (Signed)
Pt arrives EMS from [facility] after pulling out several foleys. Hx dementia. Facility concerned that pt may be retaining fluid.

## 2020-03-15 NOTE — Discharge Instructions (Signed)
Need to keep foley catheter in and follow up with urology

## 2020-03-15 NOTE — ED Notes (Signed)
Bladder scan displays >959mL urine.

## 2020-03-16 NOTE — ED Notes (Signed)
[facility] notified for transportation 

## 2020-03-17 ENCOUNTER — Other Ambulatory Visit: Payer: Self-pay

## 2020-03-17 ENCOUNTER — Emergency Department (HOSPITAL_COMMUNITY)
Admission: EM | Admit: 2020-03-17 | Discharge: 2020-03-17 | Disposition: A | Discharge status: Home / Self Care | Payer: Medicare HMO | Attending: Emergency Medicine | Admitting: Emergency Medicine

## 2020-03-17 DIAGNOSIS — F17220 Nicotine dependence, chewing tobacco, uncomplicated: Secondary | ICD-10-CM | POA: Insufficient documentation

## 2020-03-17 DIAGNOSIS — N1832 Chronic kidney disease, stage 3b: Secondary | ICD-10-CM | POA: Diagnosis not present

## 2020-03-17 DIAGNOSIS — Z7982 Long term (current) use of aspirin: Secondary | ICD-10-CM | POA: Diagnosis not present

## 2020-03-17 DIAGNOSIS — N320 Bladder-neck obstruction: Secondary | ICD-10-CM | POA: Insufficient documentation

## 2020-03-17 DIAGNOSIS — F0390 Unspecified dementia without behavioral disturbance: Secondary | ICD-10-CM | POA: Insufficient documentation

## 2020-03-17 DIAGNOSIS — N138 Other obstructive and reflux uropathy: Secondary | ICD-10-CM | POA: Diagnosis present

## 2020-03-17 DIAGNOSIS — I129 Hypertensive chronic kidney disease with stage 1 through stage 4 chronic kidney disease, or unspecified chronic kidney disease: Secondary | ICD-10-CM | POA: Insufficient documentation

## 2020-03-17 LAB — BASIC METABOLIC PANEL
Anion gap: 13 (ref 5–15)
BUN: 34 mg/dL — ABNORMAL HIGH (ref 8–23)
CO2: 20 mmol/L — ABNORMAL LOW (ref 22–32)
Calcium: 9.8 mg/dL (ref 8.9–10.3)
Chloride: 102 mmol/L (ref 98–111)
Creatinine, Ser: 2.29 mg/dL — ABNORMAL HIGH (ref 0.61–1.24)
GFR, Estimated: 25 mL/min — ABNORMAL LOW (ref >60)
Glucose, Bld: 120 mg/dL — ABNORMAL HIGH (ref 70–99)
Potassium: 4.2 mmol/L (ref 3.5–5.1)
Sodium: 135 mmol/L (ref 135–145)

## 2020-03-17 LAB — CBC WITH DIFFERENTIAL/PLATELET
Abs Immature Granulocytes: 0.1 10*3/uL — ABNORMAL HIGH (ref 0.00–0.07)
Basophils Absolute: 0.1 10*3/uL (ref 0.0–0.1)
Basophils Relative: 1 %
Eosinophils Absolute: 0.1 10*3/uL (ref 0.0–0.5)
Eosinophils Relative: 2 %
HCT: 32 % — ABNORMAL LOW (ref 39.0–52.0)
Hemoglobin: 10.1 g/dL — ABNORMAL LOW (ref 13.0–17.0)
Immature Granulocytes: 2 %
Lymphocytes Relative: 23 %
Lymphs Abs: 1.4 10*3/uL (ref 0.7–4.0)
MCH: 31.9 pg (ref 26.0–34.0)
MCHC: 31.6 g/dL (ref 30.0–36.0)
MCV: 100.9 fL — ABNORMAL HIGH (ref 80.0–100.0)
Monocytes Absolute: 0.8 10*3/uL (ref 0.1–1.0)
Monocytes Relative: 13 %
Neutro Abs: 3.6 10*3/uL (ref 1.7–7.7)
Neutrophils Relative %: 59 %
Platelets: 252 10*3/uL (ref 150–400)
RBC: 3.17 MIL/uL — ABNORMAL LOW (ref 4.22–5.81)
RDW: 13.9 % (ref 11.5–15.5)
WBC: 6.2 10*3/uL (ref 4.0–10.5)
nRBC: 0 % (ref 0.0–0.2)

## 2020-03-17 LAB — URINALYSIS, ROUTINE W REFLEX MICROSCOPIC
Bilirubin Urine: NEGATIVE
Glucose, UA: NEGATIVE mg/dL
Ketones, ur: NEGATIVE mg/dL
Nitrite: NEGATIVE
Protein, ur: 100 mg/dL — AB
RBC / HPF: >50 RBC/hpf — ABNORMAL HIGH (ref 0–5)
Specific Gravity, Urine: 1.014 (ref 1.005–1.030)
pH: 6 (ref 5.0–8.0)

## 2020-03-17 MED ORDER — SODIUM CHLORIDE 0.9 % IV SOLN
1.0000 g | Freq: Once | INTRAVENOUS | Status: AC
  Administered 2020-03-17: 1 g via INTRAVENOUS
  Filled 2020-03-17: qty 10

## 2020-03-17 MED ORDER — CEPHALEXIN 250 MG PO CAPS: 250.0000 mg | ORAL_CAPSULE | Freq: Four times a day (QID) | ORAL | 0 refills | Status: AC

## 2020-03-17 NOTE — Discharge Instructions (Addendum)
Christian Warren, you came into the ED for no urine output for 8 hours.  This is due to clogging of the catheter.  We exchanged a new catheter and also start antibiotics for possible UTI.  We also place a referral for urology for further evaluation of your bladder outflow obstruction.  Come back to the ED if urine output continues to be minimal or absent.  Please recheck BMP for his kidney function in a few days

## 2020-03-17 NOTE — ED Provider Notes (Signed)
[facility] EMERGENCY DEPARTMENT Provider Note   CSN: [id number] Arrival date & time: 03/17/20  1640     History No chief complaint on file.   Christian Tip Atienza. is a 86 y.o. male with history of bladder outlet obstruction from BPH with chronic Foley catheter, CKD 3, dementia who presented to the ED for no urine output in the last 8 hours.  Patient is from [facility].  Patient was recently at [facility] long ED 2 days prior on 03/15/2020 for pulling his catheter.  A new catheter was placed at that time.  Patient is alert and oriented x2.  Not a lot of history obtained from the patient given his dementia.  Patient denies any acute chest pain or shortness of breath or abdominal pain.  Patient states that he has been eating and drinking well.  HPI     Past Medical History:  Diagnosis Date  . Arthritis   . Hypertension     Patient Active Problem List   Diagnosis Date Noted  . AKI (acute kidney injury) (HCC) 03/11/2020  . CKD (chronic kidney disease) stage 3, GFR 30-59 ml/min (HCC) 03/11/2020  . HOH (hard of hearing) 03/11/2020  . Mild dementia (HCC) 03/11/2020  . Bladder outflow obstruction 03/11/2020  . Palliative care by specialist   . Goals of care, counseling/discussion   . DNR (do not resuscitate)   . Sepsis (HCC) 03/03/2020  . Urinary retention   . Altered mental status   . Dehydration 02/04/2020  . Arthritis 12/30/2018  . Hyperlipidemia 12/30/2018  . Hypertension 12/30/2018  . Essential hypertension 12/30/2018  . Syncope and collapse 12/30/2018  . BPH (benign prostatic hyperplasia) 04/10/2017  . Chronic right shoulder pain 04/24/2015  . Memory impairment 04/24/2015  . Tremors of nervous system 04/24/2015  . Seizures (HCC) 12/21/2014    No past surgical history on file.     No family history on file.  Social History   Tobacco Use  . Smoking status: Never Smoker  . Smokeless tobacco: Current User    Types: Chew  Vaping Use  . Vaping Use:  Never used  Substance Use Topics  . Alcohol use: Yes    Alcohol/week: 3.0 standard drinks    Types: 3 Cans of beer per week  . Drug use: Never    Home Medications Prior to Admission medications   Medication Sig Start Date End Date Taking? Authorizing Provider  acetaminophen (TYLENOL) 325 MG tablet Take 650 mg by mouth every 6 (six) hours as needed for mild pain, fever or headache.    [provider]  aspirin EC 81 MG tablet Take 81 mg by mouth daily.     [provider]  cephALEXin (KEFLEX) 250 MG capsule Take 1 capsule (250 mg total) by mouth 4 (four) times daily for 6 days. 03/17/20 03/23/20  Doran Stabler, DO  feeding supplement, ENSURE ENLIVE, (ENSURE ENLIVE) LIQD Take 237 mLs by mouth 3 (three) times daily after meals. Immediately after a meal. No strawberry 03/11/20   Calvert Cantor, MD  finasteride (PROSCAR) 5 MG tablet Take 1 tablet (5 mg total) by mouth daily. 03/12/20   Calvert Cantor, MD  omeprazole (PRILOSEC) 20 MG capsule Take 20 mg by mouth at bedtime.    [provider]  polyethylene glycol (MIRALAX / GLYCOLAX) 17 g packet Take 17 g by mouth daily.    [provider]  QUEtiapine (SEROQUEL) 25 MG tablet Take 0.5 tablets (12.5 mg total) by mouth at bedtime. 03/11/20  Calvert Cantor, MD  tamsulosin (FLOMAX) 0.4 MG CAPS capsule Take 0.4 mg by mouth 2 (two) times daily.  Patient not taking: Reported on 03/03/2020 11/30/14   [provider]    Allergies    Orange juice Erskine Emery oil]  Review of Systems   Review of Systems  Respiratory: Negative for cough, chest tightness and shortness of breath.   Cardiovascular: Negative for leg swelling.  Gastrointestinal: Negative for abdominal pain.  Psychiatric/Behavioral: Negative for agitation.    Physical Exam Updated Vital Signs BP 94/71 (BP Location: Left Arm)   Pulse 87   Temp (!) 97.4 F (36.3 C) (Oral)   Resp 16   SpO2 100%   Physical Exam Constitutional:      General: He is not in  acute distress.    Comments: Alert and oriented x2  HENT:     Head: Normocephalic.  Eyes:     General:        Right eye: No discharge.        Left eye: No discharge.  Cardiovascular:     Rate and Rhythm: Normal rate and regular rhythm.     Heart sounds: No murmur heard.   Pulmonary:     Effort: No respiratory distress.     Breath sounds: Normal breath sounds. No wheezing.  Abdominal:     General: Bowel sounds are normal. There is no distension.     Palpations: Abdomen is soft.     Tenderness: There is no abdominal tenderness. There is no guarding.  Genitourinary:    Penis: Normal.      Testes: Normal.     Comments: No suprapubic tenderness.  No visible blood at the urethra. Musculoskeletal:        General: No tenderness.     Right lower leg: No edema.     Left lower leg: No edema.     Comments: See picture on leg wounds  Skin:    General: Skin is warm.     Comments: See picture for leg wounds  Neurological:     Mental Status: He is alert.  Psychiatric:        Mood and Affect: Mood normal.         ED Results / Procedures / Treatments   Labs (all labs ordered are listed, but only abnormal results are displayed) Labs Reviewed  URINALYSIS, ROUTINE W REFLEX MICROSCOPIC - Abnormal; Notable for the following components:      Result Value   APPearance HAZY (*)    Hgb urine dipstick LARGE (*)    Protein, ur 100 (*)    Leukocytes,Ua SMALL (*)    RBC / HPF >50 (*)    Bacteria, UA RARE (*)    All other components within normal limits  CBC WITH DIFFERENTIAL/PLATELET - Abnormal; Notable for the following components:   RBC 3.17 (*)    Hemoglobin 10.1 (*)    HCT 32.0 (*)    MCV 100.9 (*)    Abs Immature Granulocytes 0.10 (*)    All other components within normal limits  BASIC METABOLIC PANEL - Abnormal; Notable for the following components:   CO2 20 (*)    Glucose, Bld 120 (*)    BUN 34 (*)    Creatinine, Ser 2.29 (*)    GFR, Estimated 25 (*)    All other  components within normal limits  URINE CULTURE    EKG EKG Interpretation  Date/Time:  Saturday March 17 2020 17:54:10 EDT Ventricular Rate:  81 PR  Interval:    QRS Duration: 88 QT Interval:  381 QTC Calculation: 426 R Axis:   1 Text Interpretation: Atrial fibrillation Ventricular premature complex No significant change since last tracing Confirmed by Richardean Canal [phone]) on 03/17/2020 6:31:24 PM   Radiology No results found.  Procedures Procedures (including critical care time)  Medications Ordered in ED Medications  cefTRIAXone (ROCEPHIN) 1 g in sodium chloride 0.9 % 100 mL IVPB (has no administration in time range)    ED Course  I have reviewed the triage vital signs and the nursing notes.  Pertinent labs & imaging results that were available during my care of the patient were reviewed by me and considered in my medical decision making (see chart for details).  Patient seen and examined.  Patient is alert and not in acute distress.  Bladder volume calculated on ultrasound was 540 cc.  No suprapubic tenderness or abdominal tenderness.  This is likely clogging of the catheter.  Exchanged new catheter collected 800 cc of urine.  Creatinine elevated at 2.29 with BUN 34 likely due to post renal obstruction.  CBC is negative for leukocytosis.  UA shows small leukocytosis with rare bacteria and large hemoglobin.  Hematuria is likely due to trauma secondary to patient's pulling his own catheter.  Given frequent changing of his Foley catheter, start patient on 1 dose of Rocephin today and 6 more days of Keflex.  No signs of infection or sepsis, 0/4 SIRS criteria.  Also sending a referral for urology for further management of his bladder outflow obstructions.  Pending urine culture.  Patient will be discharged back to [facility].  Recheck creatinine in a few days.  Come back to the ED for continued to have minimal or absent urine output.   MDM Rules/Calculators/A&P                           Patient presents to the ED for no urine output in the last 8 hours.  Patient was seen in the ED 2 days prior for patient pulling out his own catheter, a new catheter was placed at that time.  Bladder volume calculated from ultrasound was 540 cc.  This lack of urine output is likely due to clogging of his new catheter.  Exchange new catheter collected 800 cc of fluid.  UA shows hard large hemoglobin with small leukocytes.  Hematuria is likely due to trauma secondary to patient pulling his own catheter.  Given his frequent change of Foley, start antibiotics.  Pending urine culture.  Patient will be discharged back to [facility].  Recheck BMP in a few days.  Referral to urology for further management of his bladder outflow obstruction.  Final Clinical Impression(s) / ED Diagnoses Final diagnoses:  Bladder outflow obstruction  Stage 3b chronic kidney disease [facility])    Rx / DC Orders ED Discharge Orders         Ordered    Ambulatory referral to Urology        03/17/20 1956    cephALEXin (KEFLEX) 250 MG capsule  4 times daily        03/17/20 1959           Doran Stabler, DO 03/17/20 2012    Charlynne Pander, MD 03/17/20 2134

## 2020-03-17 NOTE — ED Notes (Signed)
Attempted to call report to facility x3 and have received no answer at any of the 2 numbers I have called.

## 2020-03-17 NOTE — ED Notes (Signed)
[facility] called 

## 2020-03-17 NOTE — ED Triage Notes (Signed)
Pt with hx of dementia and chronic catheter use here from [facility] for no urine output in the last eight hours per staff. Denies n/v or pain.

## 2020-03-19 LAB — URINE CULTURE: Culture: NO GROWTH

## 2020-03-24 ENCOUNTER — Emergency Department (HOSPITAL_COMMUNITY)
Admission: EM | Admit: 2020-03-24 | Discharge: 2020-03-24 | Disposition: A | Discharge status: Home / Self Care | Payer: Medicare HMO | Attending: Emergency Medicine | Admitting: Emergency Medicine

## 2020-03-24 ENCOUNTER — Encounter (HOSPITAL_COMMUNITY): Payer: Self-pay | Admitting: Emergency Medicine

## 2020-03-24 ENCOUNTER — Other Ambulatory Visit: Payer: Self-pay

## 2020-03-24 DIAGNOSIS — N401 Enlarged prostate with lower urinary tract symptoms: Secondary | ICD-10-CM | POA: Insufficient documentation

## 2020-03-24 DIAGNOSIS — N183 Chronic kidney disease, stage 3 unspecified: Secondary | ICD-10-CM | POA: Insufficient documentation

## 2020-03-24 DIAGNOSIS — F0390 Unspecified dementia without behavioral disturbance: Secondary | ICD-10-CM | POA: Diagnosis not present

## 2020-03-24 DIAGNOSIS — F17220 Nicotine dependence, chewing tobacco, uncomplicated: Secondary | ICD-10-CM | POA: Insufficient documentation

## 2020-03-24 DIAGNOSIS — T83091A Other mechanical complication of indwelling urethral catheter, initial encounter: Secondary | ICD-10-CM | POA: Diagnosis not present

## 2020-03-24 DIAGNOSIS — Z7982 Long term (current) use of aspirin: Secondary | ICD-10-CM | POA: Diagnosis not present

## 2020-03-24 DIAGNOSIS — I129 Hypertensive chronic kidney disease with stage 1 through stage 4 chronic kidney disease, or unspecified chronic kidney disease: Secondary | ICD-10-CM | POA: Diagnosis not present

## 2020-03-24 DIAGNOSIS — Z79899 Other long term (current) drug therapy: Secondary | ICD-10-CM | POA: Diagnosis not present

## 2020-03-24 DIAGNOSIS — T839XXA Unspecified complication of genitourinary prosthetic device, implant and graft, initial encounter: Secondary | ICD-10-CM

## 2020-03-24 DIAGNOSIS — R339 Retention of urine, unspecified: Secondary | ICD-10-CM | POA: Diagnosis present

## 2020-03-24 LAB — URINALYSIS, ROUTINE W REFLEX MICROSCOPIC
Bacteria, UA: NONE SEEN
Bilirubin Urine: NEGATIVE
Glucose, UA: NEGATIVE mg/dL
Ketones, ur: NEGATIVE mg/dL
Leukocytes,Ua: NEGATIVE
Nitrite: NEGATIVE
Protein, ur: NEGATIVE mg/dL
Specific Gravity, Urine: 1.014 (ref 1.005–1.030)
pH: 5 (ref 5.0–8.0)

## 2020-03-24 LAB — COMPREHENSIVE METABOLIC PANEL
ALT: 17 U/L (ref 0–44)
AST: 30 U/L (ref 15–41)
Albumin: 3.6 g/dL (ref 3.5–5.0)
Alkaline Phosphatase: 64 U/L (ref 38–126)
Anion gap: 10 (ref 5–15)
BUN: 32 mg/dL — ABNORMAL HIGH (ref 8–23)
CO2: 22 mmol/L (ref 22–32)
Calcium: 9.7 mg/dL (ref 8.9–10.3)
Chloride: 103 mmol/L (ref 98–111)
Creatinine, Ser: 2.13 mg/dL — ABNORMAL HIGH (ref 0.61–1.24)
GFR, Estimated: 28 mL/min — ABNORMAL LOW (ref >60)
Glucose, Bld: 91 mg/dL (ref 70–99)
Potassium: 3.5 mmol/L (ref 3.5–5.1)
Sodium: 135 mmol/L (ref 135–145)
Total Bilirubin: 0.5 mg/dL (ref 0.3–1.2)
Total Protein: 6.8 g/dL (ref 6.5–8.1)

## 2020-03-24 LAB — CBC
HCT: 33.2 % — ABNORMAL LOW (ref 39.0–52.0)
Hemoglobin: 10.3 g/dL — ABNORMAL LOW (ref 13.0–17.0)
MCH: 30.7 pg (ref 26.0–34.0)
MCHC: 31 g/dL (ref 30.0–36.0)
MCV: 98.8 fL (ref 80.0–100.0)
Platelets: 292 10*3/uL (ref 150–400)
RBC: 3.36 MIL/uL — ABNORMAL LOW (ref 4.22–5.81)
RDW: 14.1 % (ref 11.5–15.5)
WBC: 4.2 10*3/uL (ref 4.0–10.5)
nRBC: 0 % (ref 0.0–0.2)

## 2020-03-24 LAB — LIPASE, BLOOD: Lipase: 63 U/L — ABNORMAL HIGH (ref 11–51)

## 2020-03-24 NOTE — ED Triage Notes (Addendum)
Pt to triage via [facility] from [facility] with reports of urinary retention x 2 days.  Pt seen in ED on 10/9 for same.  Pt has history of dementia but states he is here for constipation x 1 week.  Denies abd pain.

## 2020-03-24 NOTE — ED Provider Notes (Signed)
[facility] EMERGENCY DEPARTMENT Provider Note   CSN: [id number] Arrival date & time: 03/24/20  8828     History Chief Complaint  Patient presents with   Urinary Retention   Constipation    Christian Warren. is a 86 y.o. male.  HPI Patient is a 86 year old male with a history of arthritis, hypertension, CKD, HOH, dementia, HLD  Level 4 caveat due to dementia  Always had issues with urination. Foley since at [facility].  Daughter states that he has been pulling out his Foley catheter while at [facility].  On my review of EMR it appears that he had sepsis presumed to be urosepsis in the past.  It seems that he has had a Foley since that time perhaps before that as daughter is not entirely sure of timeline.  Patient is extremely hard of hearing but is able to say he has no pain right now.  He has no memory of pulling out the Foley catheter and is not able to answer questions about the past.  States he is feeling well currently.     Past Medical History:  Diagnosis Date   Arthritis    Hypertension     Patient Active Problem List   Diagnosis Date Noted   AKI (acute kidney injury) (HCC) 03/11/2020   CKD (chronic kidney disease) stage 3, GFR 30-59 ml/min (HCC) 03/11/2020   HOH (hard of hearing) 03/11/2020   Mild dementia (HCC) 03/11/2020   Bladder outflow obstruction 03/11/2020   Palliative care by specialist    Goals of care, counseling/discussion    DNR (do not resuscitate)    Sepsis (HCC) 03/03/2020   Urinary retention    Altered mental status    Dehydration 02/04/2020   Arthritis 12/30/2018   Hyperlipidemia 12/30/2018   Hypertension 12/30/2018   Essential hypertension 12/30/2018   Syncope and collapse 12/30/2018   BPH (benign prostatic hyperplasia) 04/10/2017   Chronic right shoulder pain 04/24/2015   Memory impairment 04/24/2015   Tremors of nervous system 04/24/2015   Seizures (HCC) 12/21/2014    History  reviewed. No pertinent surgical history.     No family history on file.  Social History   Tobacco Use   Smoking status: Never Smoker   Smokeless tobacco: Current User    Types: Chew  Vaping Use   Vaping Use: Never used  Substance Use Topics   Alcohol use: Yes    Alcohol/week: 3.0 standard drinks    Types: 3 Cans of beer per week   Drug use: Never    Home Medications Prior to Admission medications   Medication Sig Start Date End Date Taking? Authorizing Provider  acetaminophen (TYLENOL) 325 MG tablet Take 650 mg by mouth every 6 (six) hours as needed for mild pain, fever or headache.    [provider]  aspirin EC 81 MG tablet Take 81 mg by mouth daily.     [provider]  feeding supplement, ENSURE ENLIVE, (ENSURE ENLIVE) LIQD Take 237 mLs by mouth 3 (three) times daily after meals. Immediately after a meal. No strawberry 03/11/20   Calvert Cantor, MD  finasteride (PROSCAR) 5 MG tablet Take 1 tablet (5 mg total) by mouth daily. 03/12/20   Calvert Cantor, MD  omeprazole (PRILOSEC) 20 MG capsule Take 20 mg by mouth at bedtime.    [provider]  polyethylene glycol (MIRALAX / GLYCOLAX) 17 g packet Take 17 g by mouth daily.    [provider]  QUEtiapine (SEROQUEL) 25 MG  tablet Take 0.5 tablets (12.5 mg total) by mouth at bedtime. 03/11/20   Calvert Cantor, MD  tamsulosin (FLOMAX) 0.4 MG CAPS capsule Take 0.4 mg by mouth 2 (two) times daily.  Patient not taking: Reported on 03/03/2020 11/30/14   [provider]    Allergies    Orange juice Erskine Emery oil]  Review of Systems   Review of Systems  Constitutional: Negative for chills and fever.  HENT: Negative for congestion.   Respiratory: Negative for shortness of breath.   Cardiovascular: Negative for chest pain.  Gastrointestinal: Negative for abdominal pain.  Musculoskeletal: Negative for neck pain.    Physical Exam Updated Vital Signs BP (!) 150/96 (BP Location: Right Arm)     Pulse 92    Temp 97.8 F (36.6 C) (Oral)    Resp 15    SpO2 100%   Physical Exam Vitals and nursing note reviewed.  Constitutional:      General: He is not in acute distress. HENT:     Head: Normocephalic and atraumatic.     Nose: Nose normal.  Eyes:     General: No scleral icterus. Cardiovascular:     Rate and Rhythm: Normal rate and regular rhythm.     Pulses: Normal pulses.     Heart sounds: Normal heart sounds.  Pulmonary:     Effort: Pulmonary effort is normal. No respiratory distress.     Breath sounds: No wheezing.  Abdominal:     Palpations: Abdomen is soft.     Tenderness: There is no abdominal tenderness. There is no guarding or rebound.  Musculoskeletal:     Cervical back: Normal range of motion.     Right lower leg: No edema.     Left lower leg: No edema.  Skin:    General: Skin is warm and dry.     Capillary Refill: Capillary refill takes less than 2 seconds.  Neurological:     Mental Status: He is alert. Mental status is at baseline.  Psychiatric:        Mood and Affect: Mood normal.        Behavior: Behavior normal.     ED Results / Procedures / Treatments   Labs (all labs ordered are listed, but only abnormal results are displayed) Labs Reviewed  LIPASE, BLOOD - Abnormal; Notable for the following components:      Result Value   Lipase 63 (*)    All other components within normal limits  COMPREHENSIVE METABOLIC PANEL - Abnormal; Notable for the following components:   BUN 32 (*)    Creatinine, Ser 2.13 (*)    GFR, Estimated 28 (*)    All other components within normal limits  CBC - Abnormal; Notable for the following components:   RBC 3.36 (*)    Hemoglobin 10.3 (*)    HCT 33.2 (*)    All other components within normal limits  URINALYSIS, ROUTINE W REFLEX MICROSCOPIC - Abnormal; Notable for the following components:   APPearance HAZY (*)    Hgb urine dipstick SMALL (*)    Non Squamous Epithelial 0-5 (*)    All other components within normal  limits    EKG None  Radiology No results found.  Procedures Procedures (including critical care time)  Medications Ordered in ED Medications - No data to display  ED Course  I have reviewed the triage vital signs and the nursing notes.  Pertinent labs & imaging results that were available during my care of the patient were reviewed  by me and considered in my medical decision making (see chart for details).    MDM Rules/Calculators/A&P                          Patient is 86 year old demented gentleman with urinary retention.  This is a chronic issue for him has been ongoing for at least 1 month.  He was seen recently for same.  He will need to follow-up with primary care doctor for [facility] recheck and follow-up with urology for his urinary retention.  His abdominal exam is benign he has no abdominal tenderness and is overall well-appearing.  He has a Foley catheter in place that the nursing staff put in place and he has drained approximately 1.5 L.  No CVA tenderness .  He denies any pain currently.  Urinalysis without any evidence of infection.  Small amount hemoglobinuria considerably improved from prior.  He understands need for follow-up with urology and this is placed in his paperwork as he does have severe dementia minimum improved.  I also informed his niece of the need for follow-up with urology and she understands this.  CMP with baseline elevated creatinine not significantly changed from prior.  He will need repeat of this.  Lipase very mildly elevated patient has no epigastric pain no history of pancreatitis.  It is also not diagnostically significant for pancreatitis.  CBC without leukocytosis.  Mild anemia present.  I discussed this case with my attending physician who cosigned this note including patient's presenting symptoms, physical exam, and planned diagnostics and interventions. Attending physician stated agreement with plan or made changes to plan which were  implemented.   Attending physician assessed patient at bedside.  Vital signs within normal limits apart from very mild hypertension which will be followed by primary care doctor.  Patient discharged with Foley catheter in place.  Final Clinical Impression(s) / ED Diagnoses Final diagnoses:  Problem with Foley catheter, initial encounter [facility])    Rx / DC Orders ED Discharge Orders    None       Gailen Shelter, [place] 03/24/20 1518    Arby Barrette, MD 03/26/20 1338

## 2020-03-24 NOTE — Discharge Instructions (Addendum)
1.  Leave your Foley catheter in place and continue home care per usual. 2.  Schedule an appointment to follow-up with your urologist for recheck within the next week. 3.  Return to emergency department if you develop fever, your Foley catheter is not draining, you start developing significant abdominal pain or other concerning symptoms.

## 2020-03-26 ENCOUNTER — Emergency Department (HOSPITAL_COMMUNITY)
Admission: EM | Admit: 2020-03-26 | Discharge: 2020-03-26 | Disposition: A | Discharge status: Home / Self Care | Payer: Medicare HMO | Attending: Emergency Medicine | Admitting: Emergency Medicine

## 2020-03-26 ENCOUNTER — Encounter (HOSPITAL_COMMUNITY): Payer: Self-pay | Admitting: Emergency Medicine

## 2020-03-26 ENCOUNTER — Other Ambulatory Visit: Payer: Self-pay

## 2020-03-26 DIAGNOSIS — Z79899 Other long term (current) drug therapy: Secondary | ICD-10-CM | POA: Insufficient documentation

## 2020-03-26 DIAGNOSIS — R339 Retention of urine, unspecified: Secondary | ICD-10-CM | POA: Insufficient documentation

## 2020-03-26 DIAGNOSIS — F0390 Unspecified dementia without behavioral disturbance: Secondary | ICD-10-CM | POA: Insufficient documentation

## 2020-03-26 DIAGNOSIS — I129 Hypertensive chronic kidney disease with stage 1 through stage 4 chronic kidney disease, or unspecified chronic kidney disease: Secondary | ICD-10-CM | POA: Insufficient documentation

## 2020-03-26 DIAGNOSIS — Z7982 Long term (current) use of aspirin: Secondary | ICD-10-CM | POA: Diagnosis not present

## 2020-03-26 DIAGNOSIS — N183 Chronic kidney disease, stage 3 unspecified: Secondary | ICD-10-CM | POA: Insufficient documentation

## 2020-03-26 LAB — URINALYSIS, ROUTINE W REFLEX MICROSCOPIC
Bacteria, UA: NONE SEEN
Bilirubin Urine: NEGATIVE
Glucose, UA: NEGATIVE mg/dL
Ketones, ur: NEGATIVE mg/dL
Nitrite: NEGATIVE
Protein, ur: 30 mg/dL — AB
RBC / HPF: >50 RBC/hpf — ABNORMAL HIGH (ref 0–5)
Specific Gravity, Urine: 1.015 (ref 1.005–1.030)
pH: 6 (ref 5.0–8.0)

## 2020-03-26 LAB — CBC WITH DIFFERENTIAL/PLATELET
Abs Immature Granulocytes: 0.02 10*3/uL (ref 0.00–0.07)
Basophils Absolute: 0.1 10*3/uL (ref 0.0–0.1)
Basophils Relative: 1 %
Eosinophils Absolute: 0.2 10*3/uL (ref 0.0–0.5)
Eosinophils Relative: 5 %
HCT: 31 % — ABNORMAL LOW (ref 39.0–52.0)
Hemoglobin: 10 g/dL — ABNORMAL LOW (ref 13.0–17.0)
Immature Granulocytes: 1 %
Lymphocytes Relative: 38 %
Lymphs Abs: 1.6 10*3/uL (ref 0.7–4.0)
MCH: 32.1 pg (ref 26.0–34.0)
MCHC: 32.3 g/dL (ref 30.0–36.0)
MCV: 99.4 fL (ref 80.0–100.0)
Monocytes Absolute: 0.7 10*3/uL (ref 0.1–1.0)
Monocytes Relative: 18 %
Neutro Abs: 1.6 10*3/uL — ABNORMAL LOW (ref 1.7–7.7)
Neutrophils Relative %: 37 %
Platelets: 234 10*3/uL (ref 150–400)
RBC: 3.12 MIL/uL — ABNORMAL LOW (ref 4.22–5.81)
RDW: 14.2 % (ref 11.5–15.5)
WBC: 4.2 10*3/uL (ref 4.0–10.5)
nRBC: 0 % (ref 0.0–0.2)

## 2020-03-26 LAB — I-STAT CHEM 8, ED
BUN: 27 mg/dL — ABNORMAL HIGH (ref 8–23)
Calcium, Ion: 1.24 mmol/L (ref 1.15–1.40)
Chloride: 105 mmol/L (ref 98–111)
Creatinine, Ser: 1.9 mg/dL — ABNORMAL HIGH (ref 0.61–1.24)
Glucose, Bld: 96 mg/dL (ref 70–99)
HCT: 28 % — ABNORMAL LOW (ref 39.0–52.0)
Hemoglobin: 9.5 g/dL — ABNORMAL LOW (ref 13.0–17.0)
Potassium: 3.6 mmol/L (ref 3.5–5.1)
Sodium: 139 mmol/L (ref 135–145)
TCO2: 24 mmol/L (ref 22–32)

## 2020-03-26 NOTE — ED Notes (Signed)
Facility called for pt return via [employer]

## 2020-03-26 NOTE — ED Triage Notes (Signed)
Patient is A&Ox2 arrived by EMS. Hx of dementia. Patient pulled foley out that was fully inflated. Pt. Is hard of hearing and has a unsteady gait.

## 2020-03-26 NOTE — ED Provider Notes (Signed)
[facility]-EMERGENCY DEPT Provider Note   CSN: [id number] Arrival date & time: 03/26/20  0040     History Chief Complaint  Patient presents with  . Urinary Retention    Pulled foley out    Christian Warren. is a 86 y.o. male.  The history is provided by the EMS personnel. The history is limited by the condition of the patient (level 5 caveat dementia ).  Illness Location:  At nursing home  Quality:  Pulled out foley  Severity:  Moderate Onset quality:  Sudden Timing:  Constant Progression:  Unchanged Chronicity:  Recurrent Context:  Has urinary retention  Relieved by:  Nothing  Worsened by:  Time  Ineffective treatments:  None tried  Associated symptoms: no fever and no shortness of breath   Risk factors:  Elderly with dementia       Past Medical History:  Diagnosis Date  . Arthritis   . Hypertension     Patient Active Problem List   Diagnosis Date Noted  . AKI (acute kidney injury) (HCC) 03/11/2020  . CKD (chronic kidney disease) stage 3, GFR 30-59 ml/min (HCC) 03/11/2020  . HOH (hard of hearing) 03/11/2020  . Mild dementia (HCC) 03/11/2020  . Bladder outflow obstruction 03/11/2020  . Palliative care by specialist   . Goals of care, counseling/discussion   . DNR (do not resuscitate)   . Sepsis (HCC) 03/03/2020  . Urinary retention   . Altered mental status   . Dehydration 02/04/2020  . Arthritis 12/30/2018  . Hyperlipidemia 12/30/2018  . Hypertension 12/30/2018  . Essential hypertension 12/30/2018  . Syncope and collapse 12/30/2018  . BPH (benign prostatic hyperplasia) 04/10/2017  . Chronic right shoulder pain 04/24/2015  . Memory impairment 04/24/2015  . Tremors of nervous system 04/24/2015  . Seizures (HCC) 12/21/2014    History reviewed. No pertinent surgical history.     History reviewed. No pertinent family history.  Social History   Tobacco Use  . Smoking status: Never Smoker  . Smokeless tobacco: Current User     Types: Chew  Vaping Use  . Vaping Use: Never used  Substance Use Topics  . Alcohol use: Yes    Alcohol/week: 3.0 standard drinks    Types: 3 Cans of beer per week  . Drug use: Never    Home Medications Prior to Admission medications   Medication Sig Start Date End Date Taking? Authorizing Provider  acetaminophen (TYLENOL) 325 MG tablet Take 650 mg by mouth every 6 (six) hours as needed for mild pain, fever or headache.   Yes [provider]  aspirin EC 81 MG tablet Take 81 mg by mouth daily.    Yes [provider]  omeprazole (PRILOSEC) 20 MG capsule Take 20 mg by mouth at bedtime.   Yes [provider]  polyethylene glycol (MIRALAX / GLYCOLAX) 17 g packet Take 17 g by mouth daily.   Yes [provider]  QUEtiapine (SEROQUEL) 25 MG tablet Take 0.5 tablets (12.5 mg total) by mouth at bedtime. 03/11/20  Yes Calvert Cantor, MD  tamsulosin (FLOMAX) 0.4 MG CAPS capsule Take 0.4 mg by mouth 2 (two) times daily.  11/30/14  Yes [provider]  zolpidem (AMBIEN) 5 MG tablet Take 5 mg by mouth at bedtime. 03/14/20  Yes [provider]  feeding supplement, ENSURE ENLIVE, (ENSURE ENLIVE) LIQD Take 237 mLs by mouth 3 (three) times daily after meals. Immediately after a meal. No strawberry 03/11/20   Calvert Cantor, MD  finasteride (PROSCAR)  5 MG tablet Take 1 tablet (5 mg total) by mouth daily. Patient not taking: Reported on 03/26/2020 03/12/20   Calvert Cantor, MD    Allergies    Orange juice Erskine Emery oil]  Review of Systems   Review of Systems  Unable to perform ROS: Dementia  Constitutional: Negative for fever.  Respiratory: Negative for shortness of breath.   Genitourinary: Positive for difficulty urinating.    Physical Exam Updated Vital Signs BP 136/88 (BP Location: Right Arm)   Pulse (!) 59   Temp 97.7 F (36.5 C) (Oral)   Resp 17   SpO2 100%   Physical Exam Vitals and nursing note reviewed.  Constitutional:      General: He is  not in acute distress.    Appearance: Normal appearance.  HENT:     Head: Normocephalic and atraumatic.     Nose: Nose normal.  Eyes:     Conjunctiva/sclera: Conjunctivae normal.     Pupils: Pupils are equal, round, and reactive to light.  Cardiovascular:     Rate and Rhythm: Normal rate and regular rhythm.     Pulses: Normal pulses.     Heart sounds: Normal heart sounds.  Pulmonary:     Effort: Pulmonary effort is normal.     Breath sounds: Normal breath sounds.  Abdominal:     General: Abdomen is flat. Bowel sounds are normal.     Palpations: Abdomen is soft.     Tenderness: There is no abdominal tenderness. There is no guarding.  Musculoskeletal:        General: Normal range of motion.     Cervical back: Normal range of motion and neck supple.  Skin:    General: Skin is warm and dry.     Capillary Refill: Capillary refill takes less than 2 seconds.  Neurological:     General: No focal deficit present.     Mental Status: He is alert.     Deep Tendon Reflexes: Reflexes normal.  Psychiatric:        Mood and Affect: Mood normal.        Behavior: Behavior normal.     ED Results / Procedures / Treatments   Labs (all labs ordered are listed, but only abnormal results are displayed) Results for orders placed or performed during the hospital encounter of 03/26/20  CBC with Differential/Platelet  Result Value Ref Range   WBC 4.2 4.0 - 10.5 K/uL   RBC 3.12 (L) 4.22 - 5.81 MIL/uL   Hemoglobin 10.0 (L) 13.0 - 17.0 g/dL   HCT 16.0 (L) 39 - 52 %   MCV 99.4 80.0 - 100.0 fL   MCH 32.1 26.0 - 34.0 pg   MCHC 32.3 30.0 - 36.0 g/dL   RDW 73.7 10.6 - 26.9 %   Platelets 234 150 - 400 K/uL   nRBC 0.0 0.0 - 0.2 %   Neutrophils Relative % 37 %   Neutro Abs 1.6 (L) 1.7 - 7.7 K/uL   Lymphocytes Relative 38 %   Lymphs Abs 1.6 0.7 - 4.0 K/uL   Monocytes Relative 18 %   Monocytes Absolute 0.7 0.1 - 1.0 K/uL   Eosinophils Relative 5 %   Eosinophils Absolute 0.2 0.0 - 0.5 K/uL    Basophils Relative 1 %   Basophils Absolute 0.1 0.0 - 0.1 K/uL   Immature Granulocytes 1 %   Abs Immature Granulocytes 0.02 0.00 - 0.07 K/uL  Urinalysis, Routine w reflex microscopic Urine, Catheterized  Result Value Ref Range  Color, Urine YELLOW YELLOW   APPearance CLEAR CLEAR   Specific Gravity, Urine 1.015 1.005 - 1.030   pH 6.0 5.0 - 8.0   Glucose, UA NEGATIVE NEGATIVE mg/dL   Hgb urine dipstick LARGE (A) NEGATIVE   Bilirubin Urine NEGATIVE NEGATIVE   Ketones, ur NEGATIVE NEGATIVE mg/dL   Protein, ur 30 (A) NEGATIVE mg/dL   Nitrite NEGATIVE NEGATIVE   Leukocytes,Ua TRACE (A) NEGATIVE   RBC / HPF >50 (H) 0 - 5 RBC/hpf   WBC, UA 11-20 0 - 5 WBC/hpf   Bacteria, UA NONE SEEN NONE SEEN  I-stat chem 8, ED (not at [facility] or [facility])  Result Value Ref Range   Sodium 139 135 - 145 mmol/L   Potassium 3.6 3.5 - 5.1 mmol/L   Chloride 105 98 - 111 mmol/L   BUN 27 (H) 8 - 23 mg/dL   Creatinine, Ser 9.48 (H) 0.61 - 1.24 mg/dL   Glucose, Bld 96 70 - 99 mg/dL   Calcium, Ion 5.46 2.70 - 1.40 mmol/L   TCO2 24 22 - 32 mmol/L   Hemoglobin 9.5 (L) 13.0 - 17.0 g/dL   HCT 35.0 (L) 39 - 52 %   CT Head Wo Contrast  Result Date: 03/03/2020 CLINICAL DATA:  Mental status change. EXAM: CT HEAD WITHOUT CONTRAST TECHNIQUE: Contiguous axial images were obtained from the base of the skull through the vertex without intravenous contrast. COMPARISON:  CT 02/04/2020 FINDINGS: Brain: Similar advanced patchy white matter hypoattenuation, compatible with the sequela of chronic microvascular ischemic disease. Similar remote lacunar infarcts involving the right caudate and possibly in the right frontal white matter. No evidence of acute large vascular territory infarct. No acute hemorrhage. No abnormal mass lesion or extra-axial collection. Similar moderate diffuse cerebral atrophy with ex vacuo ventricular dilation. Partially empty sella. Vascular: Calcific atherosclerosis. Skull: Normal. Negative for fracture or focal  lesion. Sinuses/Orbits: No substantial paranasal sinus disease. No acute orbital abnormality. Other: No mastoid effusion. IMPRESSION: 1. No evidence of acute intracranial abnormality. 2. Similar diffuse cerebral atrophy, chronic microvascular ischemic disease, and remote lacunar infarcts. Electronically Signed   By: Feliberto Harts MD   On: 03/03/2020 12:01   US RENAL  Result Date: 03/04/2020 CLINICAL DATA:  Acute kidney injury EXAM: RENAL / URINARY TRACT ULTRASOUND COMPLETE COMPARISON:  03/03/2020 FINDINGS: Right Kidney: Renal measurements: 10 x 4.1 x 4.6 cm = volume: 98.6 mL. Again noted is mild hydronephrosis. This appears improved from prior study. Left Kidney: Renal measurements: 11.7 x 4.9 x 5 cm = volume: 147 mL. Again noted is mild hydronephrosis that appears slightly improved from prior study. Bladder: The bladder is decompressed with a Foley catheter. Despite the degree of underdistention, the bladder wall appears to be thickened which may be secondary to chronic outlet obstruction. Other: The prostate gland is significantly enlarged measuring 7 x 4.8 cm. IMPRESSION: 1. Persistent but improved mild bilateral hydronephrosis, left worse than right. 2. Urinary bladder decompressed by Foley catheter. There appears to be diffuse bladder wall thickening despite the degree of underdistention which can be seen in patients with chronic outlet obstruction. 3. Prostatomegaly. Electronically Signed   By: Katherine Mantle M.D.   On: 03/04/2020 17:39   US RENAL  Result Date: 03/03/2020 CLINICAL DATA:  AKI EXAM: RENAL / URINARY TRACT ULTRASOUND COMPLETE COMPARISON:  02/04/2020 CT abdomen pelvis and prior. 07/07/2014 renal ultrasound. FINDINGS: Right Kidney: Renal measurements: 11.2 x 4.7 x 6.2 cm = volume: 169.4 mL. Echogenicity within normal limits. No mass. Mild hydronephrosis visualized. Left  Kidney: Renal measurements: 12.8 x 5.8 x 6.4 cm = volume: 249.8 mL. Echogenicity within normal limits. No mass.  Mild hydronephrosis visualized. Bladder: Appears normal for degree of bladder distention. Indwelling Foley catheter. Other: None. IMPRESSION: Mild bilateral hydronephrosis. Parenchymal echogenicity within normal limits. Indwelling Foley catheter. Electronically Signed   By: Stana Buntinghikanele  Emekauwa M.D.   On: 03/03/2020 15:41   DG Chest Portable 1 View  Result Date: 03/03/2020 CLINICAL DATA:  Altered mental status.  Code sepsis. EXAM: PORTABLE CHEST 1 VIEW COMPARISON:  Chest radiograph and CT 02/04/2020. FINDINGS: Mild enlargement the cardiac silhouette, similar to prior. Aortic atherosclerosis. Linear bibasilar opacities, favor atelectasis. No focal consolidation. No visible pleural effusions. No pneumothorax. The visualized skeletal structures are unremarkable. IMPRESSION: Linear bibasilar opacities, favor atelectasis. Electronically Signed   By: Feliberto HartsFrederick S Jones MD   On: 03/03/2020 11:56    Radiology No results found.  Procedures Procedures (including critical care time)  Medications Ordered in ED Medications - No data to display  ED Course  I have reviewed the triage vital signs and the nursing notes.  Pertinent labs & imaging results that were available during my care of the patient were reviewed by me and considered in my medical decision making (see chart for details).    foley replaced, labs unchanged.  Culture sent follow up with urology.    Christian GentileJohn Jaye Jr. was evaluated in Emergency Department on 03/26/2020 for the symptoms described in the history of present illness. He was evaluated in the context of the global COVID-19 pandemic, which necessitated consideration that the patient might be at risk for infection with the SARS-CoV-2 virus that causes COVID-19. Institutional protocols and algorithms that pertain to the evaluation of patients at risk for COVID-19 are in a state of rapid change based on information released by regulatory bodies including the CDC and federal and state  organizations. These policies and algorithms were followed during the patient's care in the ED.  Final Clinical Impression(s) / ED Diagnoses Return for intractable cough, coughing up blood,fevers >100.4 unrelieved by medication, shortness of breath, intractable vomiting, chest pain, shortness of breath, weakness,numbness, changes in speech, facial asymmetry,abdominal pain, passing out,Inability to tolerate liquids or food, cough, altered mental status or any concerns. No signs of systemic illness or infection. The patient is nontoxic-appearing on exam and vital signs are within normal limits.   I have reviewed the triage vital signs and the nursing notes. Pertinent labs &imaging results that were available during my care of the patient were reviewed by me and considered in my medical decision making (see chart for details).After history, exam, and medical workup I feel the patient has beenappropriately medically screened and is safe for discharge home. Pertinent diagnoses were discussed with the patient. Patient was given return precautions.    Mell Guia, MD 03/26/20 [phone]

## 2020-03-26 NOTE — ED Notes (Signed)
[facility] called for pt return back to facility  

## 2020-03-27 LAB — URINE CULTURE: Culture: NO GROWTH

## 2020-04-02 ENCOUNTER — Emergency Department (HOSPITAL_COMMUNITY)
Admission: EM | Admit: 2020-04-02 | Discharge: 2020-04-02 | Disposition: A | Discharge status: Home / Self Care | Payer: Medicare HMO | Attending: Emergency Medicine | Admitting: Emergency Medicine

## 2020-04-02 ENCOUNTER — Other Ambulatory Visit: Payer: Self-pay

## 2020-04-02 ENCOUNTER — Encounter (HOSPITAL_COMMUNITY): Payer: Self-pay

## 2020-04-02 DIAGNOSIS — R339 Retention of urine, unspecified: Secondary | ICD-10-CM | POA: Insufficient documentation

## 2020-04-02 DIAGNOSIS — Z7982 Long term (current) use of aspirin: Secondary | ICD-10-CM | POA: Diagnosis not present

## 2020-04-02 DIAGNOSIS — F0390 Unspecified dementia without behavioral disturbance: Secondary | ICD-10-CM | POA: Insufficient documentation

## 2020-04-02 DIAGNOSIS — Z79899 Other long term (current) drug therapy: Secondary | ICD-10-CM | POA: Insufficient documentation

## 2020-04-02 DIAGNOSIS — R310 Gross hematuria: Secondary | ICD-10-CM | POA: Insufficient documentation

## 2020-04-02 DIAGNOSIS — N183 Chronic kidney disease, stage 3 unspecified: Secondary | ICD-10-CM | POA: Insufficient documentation

## 2020-04-02 DIAGNOSIS — I129 Hypertensive chronic kidney disease with stage 1 through stage 4 chronic kidney disease, or unspecified chronic kidney disease: Secondary | ICD-10-CM | POA: Diagnosis not present

## 2020-04-02 LAB — URINALYSIS, ROUTINE W REFLEX MICROSCOPIC
Bilirubin Urine: NEGATIVE
Glucose, UA: NEGATIVE mg/dL
Ketones, ur: NEGATIVE mg/dL
Leukocytes,Ua: NEGATIVE
Nitrite: NEGATIVE
Protein, ur: 100 mg/dL — AB
RBC / HPF: >50 RBC/hpf — ABNORMAL HIGH (ref 0–5)
Specific Gravity, Urine: 1.014 (ref 1.005–1.030)
WBC, UA: >50 WBC/hpf — ABNORMAL HIGH (ref 0–5)
pH: 6 (ref 5.0–8.0)

## 2020-04-02 MED ORDER — LIDOCAINE HCL URETHRAL/MUCOSAL 2 % EX GEL
1.0000 "application " | Freq: Once | CUTANEOUS | Status: AC
  Administered 2020-04-02: 1 via URETHRAL
  Filled 2020-04-02: qty 11

## 2020-04-02 MED ORDER — CEPHALEXIN 500 MG PO CAPS: 500.0000 mg | ORAL_CAPSULE | Freq: Four times a day (QID) | ORAL | 0 refills | Status: DC

## 2020-04-02 MED ORDER — LIDOCAINE HCL URETHRAL/MUCOSAL 2 % EX GEL: 1.0000 "application " | Freq: Once | CUTANEOUS | Status: DC

## 2020-04-02 NOTE — ED Triage Notes (Signed)
Pt lives at [facility] and EMS was called out for hematuria.  Pt removed catheter and it was replaced or pushed back in by staff and has had drops of blood and clots ever since

## 2020-04-02 NOTE — ED Provider Notes (Addendum)
[facility]-EMERGENCY DEPT Provider Note   CSN: [id number] Arrival date & time: 04/02/20  1345     History Chief Complaint  Patient presents with  . Hematuria    Christian Warren. is a 86 y.o. male.  86 yo M with a chief complaints of urinary retention.  The patient reportedly removed his Foley last night at his nursing home when it was attempted to be replaced.  Since then the patient is only had bright red blood.  Complains of not being able to urinate.  Patient is very difficult to obtain history due to hard of hearing.  Level 5 caveat.  The history is provided by the patient.  Hematuria This is a new problem. The current episode started yesterday. The problem occurs constantly. The problem has not changed since onset.Nothing aggravates the symptoms. Nothing relieves the symptoms. He has tried nothing for the symptoms. The treatment provided no relief.       Past Medical History:  Diagnosis Date  . Arthritis   . Hypertension     Patient Active Problem List   Diagnosis Date Noted  . AKI (acute kidney injury) (HCC) 03/11/2020  . CKD (chronic kidney disease) stage 3, GFR 30-59 ml/min (HCC) 03/11/2020  . HOH (hard of hearing) 03/11/2020  . Mild dementia (HCC) 03/11/2020  . Bladder outflow obstruction 03/11/2020  . Palliative care by specialist   . Goals of care, counseling/discussion   . DNR (do not resuscitate)   . Sepsis (HCC) 03/03/2020  . Urinary retention   . Altered mental status   . Dehydration 02/04/2020  . Arthritis 12/30/2018  . Hyperlipidemia 12/30/2018  . Hypertension 12/30/2018  . Essential hypertension 12/30/2018  . Syncope and collapse 12/30/2018  . BPH (benign prostatic hyperplasia) 04/10/2017  . Chronic right shoulder pain 04/24/2015  . Memory impairment 04/24/2015  . Tremors of nervous system 04/24/2015  . Seizures (HCC) 12/21/2014    History reviewed. No pertinent surgical history.     History reviewed. No pertinent  family history.  Social History   Tobacco Use  . Smoking status: Never Smoker  . Smokeless tobacco: Current User    Types: Chew  Vaping Use  . Vaping Use: Never used  Substance Use Topics  . Alcohol use: Yes    Alcohol/week: 3.0 standard drinks    Types: 3 Cans of beer per week  . Drug use: Never    Home Medications Prior to Admission medications   Medication Sig Start Date End Date Taking? Authorizing Provider  acetaminophen (TYLENOL) 325 MG tablet Take 650 mg by mouth every 6 (six) hours as needed for mild pain, fever or headache.    [provider]  amLODipine (NORVASC) 10 MG tablet Take 10 mg by mouth daily.    [provider]  aspirin EC 81 MG tablet Take 81 mg by mouth daily.     [provider]  cephALEXin (KEFLEX) 500 MG capsule Take 1 capsule (500 mg total) by mouth 4 (four) times daily. 04/02/20   Melene Plan, DO  feeding supplement, ENSURE ENLIVE, (ENSURE ENLIVE) LIQD Take 237 mLs by mouth 3 (three) times daily after meals. Immediately after a meal. No strawberry 03/11/20   Calvert Cantor, MD  finasteride (PROSCAR) 5 MG tablet Take 1 tablet (5 mg total) by mouth daily. 03/12/20   Calvert Cantor, MD  omeprazole (PRILOSEC) 20 MG capsule Take 20 mg by mouth at bedtime.    [provider]  polyethylene glycol (MIRALAX / GLYCOLAX) 17 g  packet Take 17 g by mouth daily.    [provider]  QUEtiapine (SEROQUEL) 25 MG tablet Take 0.5 tablets (12.5 mg total) by mouth at bedtime. 03/11/20   Calvert Cantor, MD  tamsulosin (FLOMAX) 0.4 MG CAPS capsule Take 0.4 mg by mouth 2 (two) times daily.  11/30/14   [provider]  zolpidem (AMBIEN) 5 MG tablet Take 5 mg by mouth at bedtime. 03/14/20   [provider]    Allergies    Orange juice [orange oil]  Review of Systems   Review of Systems  Unable to perform ROS: Other (hard of hearing)  Genitourinary: Positive for difficulty urinating and hematuria.    Physical Exam Updated  Vital Signs BP 117/64 (BP Location: Right Arm)   Pulse 71   Temp 98.2 F (36.8 C) (Oral)   Resp 17   SpO2 100%   Physical Exam Vitals and nursing note reviewed.  Constitutional:      Appearance: He is well-developed.  HENT:     Head: Normocephalic and atraumatic.  Eyes:     Pupils: Pupils are equal, round, and reactive to light.  Neck:     Vascular: No JVD.  Cardiovascular:     Rate and Rhythm: Normal rate and regular rhythm.     Heart sounds: No murmur heard.  No friction rub. No gallop.   Pulmonary:     Effort: No respiratory distress.     Breath sounds: No wheezing.  Abdominal:     General: There is no distension.     Tenderness: There is no abdominal tenderness. There is no guarding or rebound.     Comments: Painful palpable bladder in the suprapubic region Foley in place though appears to be somewhat further out than I would expect.  Musculoskeletal:        General: Normal range of motion.     Cervical back: Normal range of motion and neck supple.  Skin:    Coloration: Skin is not pale.     Findings: No rash.  Neurological:     Mental Status: He is alert and oriented to person, place, and time.  Psychiatric:        Behavior: Behavior normal.     ED Results / Procedures / Treatments   Labs (all labs ordered are listed, but only abnormal results are displayed) Labs Reviewed  URINE CULTURE  URINALYSIS, ROUTINE W REFLEX MICROSCOPIC    EKG None  Radiology No results found.  Procedures Procedures (including critical care time) Emergency Focused Ultrasound Exam Limited ultrasound of the Bladder.   Performed and interpreted by Dr. Adela Lank Indication: evaluation for urinary retention Transverse and Sagittal views of the bladder are obtained and calculations are performed to determine an estimated bladder volume for signs of post-renal obstruction.  Findings: Bladder is 1700 cc full, no foley visualized Interpretation:  evidence of outlet obstruction Images  archived electronically.  CPT Codes: 30092 and [phone]  Medications Ordered in ED Medications  lidocaine (XYLOCAINE) 2 % jelly 1 application (has no administration in time range)  lidocaine (XYLOCAINE) 2 % jelly 1 application (has no administration in time range)    ED Course  I have reviewed the triage vital signs and the nursing notes.  Pertinent labs & imaging results that were available during my care of the patient were reviewed by me and considered in my medical decision making (see chart for details).    MDM Rules/Calculators/A&P  86 yo M with a chief complaints of an inability urinate after he had pulled out his Foley catheter and it was replaced last night.  Bedside ultrasound without any visible Foley bulb in the bladder.  The patient does have acute urinary tension.  Will exchange the catheter here in the ED.  Catheter placed with 2 L of urine return.  Patient is feeling much better requesting discharge home.  With his multiple manipulations of his catheter will start on antibiotics.  We will have him follow-up with his urologist as well as family doctor.  Patient notified by the nurse that he wanted to kill himself with firearm.  I feel that this is unlikely to be changed by a psychiatric evaluation here.  Has no access to firearms.  Will defer that work-up to his nursing home facility.  4:09 PM:  I have discussed the diagnosis/risks/treatment options with the patient and believe the pt to be eligible for discharge home to follow-up with PCP, urology. We also discussed returning to the ED immediately if new or worsening sx occur. We discussed the sx which are most concerning (e.g., sudden worsening pain, fever, inability to tolerate by mouth, inability to urinate) that necessitate immediate return. Medications administered to the patient during their visit and any new prescriptions provided to the patient are listed below.  Medications given during this  visit Medications  lidocaine (XYLOCAINE) 2 % jelly 1 application (has no administration in time range)  lidocaine (XYLOCAINE) 2 % jelly 1 application (has no administration in time range)     The patient appears reasonably screen and/or stabilized for discharge and I doubt any other medical condition or other [facility] requiring further screening, evaluation, or treatment in the ED at this time prior to discharge.   Final Clinical Impression(s) / ED Diagnoses Final diagnoses:  Gross hematuria    Rx / DC Orders ED Discharge Orders         Ordered    cephALEXin (KEFLEX) 500 MG capsule  4 times daily        04/02/20 1608           Melene Plan, DO 04/02/20 1609    Melene Plan, DO 04/02/20 1654

## 2020-04-02 NOTE — ED Notes (Signed)
Report called, Transport requested via [facility].   Will be at least 1.5 hours out

## 2020-04-02 NOTE — Discharge Instructions (Signed)
When you have this much blood in your urine is difficult to tell if it is infected or not.  With the multiple attempts at catheter placement I will start you on antibiotics.  Please follow-up with your family doctor and your urologist.  Return for fever or inability to urinate.

## 2020-04-03 NOTE — ED Notes (Signed)
Upon Clinical research associate entering room, 3 way cath partially in with balloon inflated with 20cc of saline. Cath deflated by writer, removed. Writer attempted successfully to insert Coude #20 up to Y with immediate return of blood colored urine. No noted clots.

## 2020-04-04 ENCOUNTER — Emergency Department (HOSPITAL_COMMUNITY)
Admission: EM | Admit: 2020-04-04 | Discharge: 2020-04-04 | Disposition: A | Discharge status: Home / Self Care | Payer: Medicare HMO | Attending: Emergency Medicine | Admitting: Emergency Medicine

## 2020-04-04 ENCOUNTER — Other Ambulatory Visit: Payer: Self-pay

## 2020-04-04 DIAGNOSIS — Z7982 Long term (current) use of aspirin: Secondary | ICD-10-CM | POA: Diagnosis not present

## 2020-04-04 DIAGNOSIS — T839XXA Unspecified complication of genitourinary prosthetic device, implant and graft, initial encounter: Secondary | ICD-10-CM

## 2020-04-04 DIAGNOSIS — T83091A Other mechanical complication of indwelling urethral catheter, initial encounter: Secondary | ICD-10-CM | POA: Diagnosis not present

## 2020-04-04 DIAGNOSIS — I129 Hypertensive chronic kidney disease with stage 1 through stage 4 chronic kidney disease, or unspecified chronic kidney disease: Secondary | ICD-10-CM | POA: Insufficient documentation

## 2020-04-04 DIAGNOSIS — R310 Gross hematuria: Secondary | ICD-10-CM | POA: Insufficient documentation

## 2020-04-04 DIAGNOSIS — N183 Chronic kidney disease, stage 3 unspecified: Secondary | ICD-10-CM | POA: Diagnosis not present

## 2020-04-04 DIAGNOSIS — Z79899 Other long term (current) drug therapy: Secondary | ICD-10-CM | POA: Diagnosis not present

## 2020-04-04 LAB — URINALYSIS, ROUTINE W REFLEX MICROSCOPIC
Bilirubin Urine: NEGATIVE
Glucose, UA: NEGATIVE mg/dL
Ketones, ur: NEGATIVE mg/dL
Nitrite: NEGATIVE
Protein, ur: 100 mg/dL — AB
RBC / HPF: >50 RBC/hpf — ABNORMAL HIGH (ref 0–5)
Specific Gravity, Urine: 1.019 (ref 1.005–1.030)
WBC, UA: >50 WBC/hpf — ABNORMAL HIGH (ref 0–5)
pH: 5 (ref 5.0–8.0)

## 2020-04-04 LAB — URINE CULTURE: Culture: NO GROWTH

## 2020-04-04 NOTE — ED Provider Notes (Signed)
[facility]-EMER[facility] DEPT Provider Note   CSN: [id number] Arrival date & time: 04/04/20  0047     History Chief Complaint  Patient presents with  . catheter out    Patient has oulled out his indwelling catheter and is now bleeding.    Christian Warren. is a 86 y.o. male.  Apparently patient keeps pulling out his catheter. Did it again. Now is bleeding. Has dementia, can't give more info right now.         Past Medical History:  Diagnosis Date  . Arthritis   . Hypertension     Patient Active Problem List   Diagnosis Date Noted  . AKI (acute kidney injury) (HCC) 03/11/2020  . CKD (chronic kidney disease) stage 3, GFR 30-59 ml/min (HCC) 03/11/2020  . HOH (hard of hearing) 03/11/2020  . Mild dementia (HCC) 03/11/2020  . Bladder outflow obstruction 03/11/2020  . Palliative care by specialist   . Goals of care, counseling/discussion   . DNR (do not resuscitate)   . Sepsis (HCC) 03/03/2020  . Urinary retention   . Altered mental status   . Dehydration 02/04/2020  . Arthritis 12/30/2018  . Hyperlipidemia 12/30/2018  . Hypertension 12/30/2018  . Essential hypertension 12/30/2018  . Syncope and collapse 12/30/2018  . BPH (benign prostatic hyperplasia) 04/10/2017  . Chronic right shoulder pain 04/24/2015  . Memory impairment 04/24/2015  . Tremors of nervous system 04/24/2015  . Seizures (HCC) 12/21/2014    No past surgical history on file.     No family history on file.  Social History   Tobacco Use  . Smoking status: Never Smoker  . Smokeless tobacco: Current User    Types: Chew  Vaping Use  . Vaping Use: Never used  Substance Use Topics  . Alcohol use: Yes    Alcohol/week: 3.0 standard drinks    Types: 3 Cans of beer per week  . Drug use: Never    Home Medications Prior to Admission medications   Medication Sig Start Date End Date Taking? Authorizing Provider  acetaminophen (TYLENOL) 325 MG tablet Take 650 mg by mouth every 6  (six) hours as needed for mild pain, fever or headache.    [provider]  amLODipine (NORVASC) 10 MG tablet Take 10 mg by mouth daily.    [provider]  aspirin EC 81 MG tablet Take 81 mg by mouth daily.     [provider]  cephALEXin (KEFLEX) 500 MG capsule Take 1 capsule (500 mg total) by mouth 4 (four) times daily. 04/02/20   Melene Plan, DO  feeding supplement, ENSURE ENLIVE, (ENSURE ENLIVE) LIQD Take 237 mLs by mouth 3 (three) times daily after meals. Immediately after a meal. No strawberry 03/11/20   Calvert Cantor, MD  finasteride (PROSCAR) 5 MG tablet Take 1 tablet (5 mg total) by mouth daily. 03/12/20   Calvert Cantor, MD  omeprazole (PRILOSEC) 20 MG capsule Take 20 mg by mouth at bedtime.    [provider]  polyethylene glycol (MIRALAX / GLYCOLAX) 17 g packet Take 17 g by mouth daily.    [provider]  QUEtiapine (SEROQUEL) 25 MG tablet Take 0.5 tablets (12.5 mg total) by mouth at bedtime. 03/11/20   Calvert Cantor, MD  tamsulosin (FLOMAX) 0.4 MG CAPS capsule Take 0.4 mg by mouth 2 (two) times daily.  11/30/14   [provider]  zolpidem (AMBIEN) 5 MG tablet Take 5 mg by mouth at bedtime. 03/14/20   [provider]  Allergies    Orange juice [orange oil]  Review of Systems   Review of Systems  Unable to perform ROS: Dementia    Physical Exam Updated Vital Signs BP 97/60 (BP Location: Right Arm)   Pulse 62   Temp (!) 97.5 F (36.4 C) (Oral)   Resp 18   SpO2 98%   Physical Exam Vitals and nursing note reviewed.  Constitutional:      Appearance: He is well-developed.  HENT:     Head: Normocephalic and atraumatic.     Nose: No congestion or rhinorrhea.     Mouth/Throat:     Mouth: Mucous membranes are moist.  Cardiovascular:     Rate and Rhythm: Normal rate.  Pulmonary:     Effort: Pulmonary effort is normal. No respiratory distress.  Abdominal:     General: Abdomen is flat. There is no distension.    Genitourinary:    Comments: Dried blood at meatus.  Ecchymosis near base of penile shaft Musculoskeletal:        General: Normal range of motion.     Cervical back: Normal range of motion.  Skin:    General: Skin is warm and dry.  Neurological:     General: No focal deficit present.     Mental Status: He is alert.     ED Results / Procedures / Treatments   Labs (all labs ordered are listed, but only abnormal results are displayed) Labs Reviewed  URINALYSIS, ROUTINE W REFLEX MICROSCOPIC - Abnormal; Notable for the following components:      Result Value   Color, Urine AMBER (*)    APPearance CLOUDY (*)    Hgb urine dipstick LARGE (*)    Protein, ur 100 (*)    Leukocytes,Ua MODERATE (*)    RBC / HPF >50 (*)    WBC, UA >50 (*)    Bacteria, UA RARE (*)    All other components within normal limits    EKG None  Radiology No results found.  Procedures Procedures (including critical care time)  Medications Ordered in ED Medications - No data to display  ED Course  I have reviewed the triage vital signs and the nursing notes.  Pertinent labs & imaging results that were available during my care of the patient were reviewed by me and considered in my medical decision making (see chart for details).    MDM Rules/Calculators/A&P                          Catheter replaced. Urine as expected, culture will be added. No abx started.   Mittens placed. Will dc with mittens and urology follow up.   Final Clinical Impression(s) / ED Diagnoses Final diagnoses:  Gross hematuria  Complication of Foley catheter, initial encounter [facility])    Rx / DC Orders ED Discharge Orders    None       Desa Rech, Barbara Cower, MD 04/04/20 0136

## 2020-04-04 NOTE — Discharge Instructions (Addendum)
Please place mittens on Mr. Christian Warren or try to make other efforts to try and discourage catheter removal as he can cause severe damage to his prostate, urethra and bladder. Follow up with his urologist in 2-3 days for recheck.

## 2020-04-04 NOTE — ED Triage Notes (Signed)
Patient here from [place] who has pulled out his indwelling cath and is now bleeding.  116/76,80,, hx of dementia.

## 2020-04-05 LAB — URINE CULTURE: Culture: NO GROWTH

## 2020-04-07 ENCOUNTER — Other Ambulatory Visit: Payer: Self-pay

## 2020-04-07 ENCOUNTER — Emergency Department (HOSPITAL_COMMUNITY)
Admission: EM | Admit: 2020-04-07 | Discharge: 2020-04-07 | Disposition: A | Discharge status: Home / Self Care | Payer: Medicare HMO | Attending: Emergency Medicine | Admitting: Emergency Medicine

## 2020-04-07 ENCOUNTER — Encounter (HOSPITAL_COMMUNITY): Payer: Self-pay

## 2020-04-07 DIAGNOSIS — N183 Chronic kidney disease, stage 3 unspecified: Secondary | ICD-10-CM | POA: Diagnosis not present

## 2020-04-07 DIAGNOSIS — Z7982 Long term (current) use of aspirin: Secondary | ICD-10-CM | POA: Insufficient documentation

## 2020-04-07 DIAGNOSIS — T839XXA Unspecified complication of genitourinary prosthetic device, implant and graft, initial encounter: Secondary | ICD-10-CM

## 2020-04-07 DIAGNOSIS — Y732 Prosthetic and other implants, materials and accessory gastroenterology and urology devices associated with adverse incidents: Secondary | ICD-10-CM | POA: Insufficient documentation

## 2020-04-07 DIAGNOSIS — T83028A Displacement of other indwelling urethral catheter, initial encounter: Secondary | ICD-10-CM | POA: Insufficient documentation

## 2020-04-07 DIAGNOSIS — F0390 Unspecified dementia without behavioral disturbance: Secondary | ICD-10-CM | POA: Insufficient documentation

## 2020-04-07 DIAGNOSIS — R319 Hematuria, unspecified: Secondary | ICD-10-CM | POA: Insufficient documentation

## 2020-04-07 DIAGNOSIS — Y846 Urinary catheterization as the cause of abnormal reaction of the patient, or of later complication, without mention of misadventure at the time of the procedure: Secondary | ICD-10-CM | POA: Insufficient documentation

## 2020-04-07 DIAGNOSIS — Y92129 Unspecified place in nursing home as the place of occurrence of the external cause: Secondary | ICD-10-CM | POA: Insufficient documentation

## 2020-04-07 DIAGNOSIS — I129 Hypertensive chronic kidney disease with stage 1 through stage 4 chronic kidney disease, or unspecified chronic kidney disease: Secondary | ICD-10-CM | POA: Insufficient documentation

## 2020-04-07 LAB — URINALYSIS, ROUTINE W REFLEX MICROSCOPIC
Bacteria, UA: NONE SEEN
Bilirubin Urine: NEGATIVE
Glucose, UA: NEGATIVE mg/dL
Ketones, ur: NEGATIVE mg/dL
Leukocytes,Ua: NEGATIVE
Nitrite: NEGATIVE
Protein, ur: NEGATIVE mg/dL
RBC / HPF: >50 RBC/hpf — ABNORMAL HIGH (ref 0–5)
Specific Gravity, Urine: 1.016 (ref 1.005–1.030)
pH: 6 (ref 5.0–8.0)

## 2020-04-07 LAB — CBC WITH DIFFERENTIAL/PLATELET
Abs Immature Granulocytes: 0.02 10*3/uL (ref 0.00–0.07)
Basophils Absolute: 0 10*3/uL (ref 0.0–0.1)
Basophils Relative: 1 %
Eosinophils Absolute: 0.2 10*3/uL (ref 0.0–0.5)
Eosinophils Relative: 4 %
HCT: 26.7 % — ABNORMAL LOW (ref 39.0–52.0)
Hemoglobin: 8.5 g/dL — ABNORMAL LOW (ref 13.0–17.0)
Immature Granulocytes: 0 %
Lymphocytes Relative: 24 %
Lymphs Abs: 1.3 10*3/uL (ref 0.7–4.0)
MCH: 31.6 pg (ref 26.0–34.0)
MCHC: 31.8 g/dL (ref 30.0–36.0)
MCV: 99.3 fL (ref 80.0–100.0)
Monocytes Absolute: 0.7 10*3/uL (ref 0.1–1.0)
Monocytes Relative: 13 %
Neutro Abs: 3.1 10*3/uL (ref 1.7–7.7)
Neutrophils Relative %: 58 %
Platelets: 163 10*3/uL (ref 150–400)
RBC: 2.69 MIL/uL — ABNORMAL LOW (ref 4.22–5.81)
RDW: 14.6 % (ref 11.5–15.5)
WBC: 5.4 10*3/uL (ref 4.0–10.5)
nRBC: 0 % (ref 0.0–0.2)

## 2020-04-07 LAB — COMPREHENSIVE METABOLIC PANEL
ALT: 12 U/L (ref 0–44)
AST: 19 U/L (ref 15–41)
Albumin: 3.7 g/dL (ref 3.5–5.0)
Alkaline Phosphatase: 67 U/L (ref 38–126)
Anion gap: 12 (ref 5–15)
BUN: 38 mg/dL — ABNORMAL HIGH (ref 8–23)
CO2: 21 mmol/L — ABNORMAL LOW (ref 22–32)
Calcium: 9.8 mg/dL (ref 8.9–10.3)
Chloride: 110 mmol/L (ref 98–111)
Creatinine, Ser: 2.26 mg/dL — ABNORMAL HIGH (ref 0.61–1.24)
GFR, Estimated: 28 mL/min — ABNORMAL LOW (ref >60)
Glucose, Bld: 104 mg/dL — ABNORMAL HIGH (ref 70–99)
Potassium: 3.9 mmol/L (ref 3.5–5.1)
Sodium: 143 mmol/L (ref 135–145)
Total Bilirubin: 0.5 mg/dL (ref 0.3–1.2)
Total Protein: 6.6 g/dL (ref 6.5–8.1)

## 2020-04-07 NOTE — ED Provider Notes (Signed)
[facility]PT Provider Note   CSN: [id number] Arrival date & time: 04/07/20  1655     History No chief complaint on file.   Christian Warren. is a 86 y.o. male with a past medical history of dementia, AKI, CKD, HTN, HLD bladder outflow obstruction presents today for evaluation after he reportedly pulled out his Foley catheter.  Chart review shows that he has been seen multiple times in the past month for the same complaint and on the most recent visit was discharged with soft mittens.  I attempted to contact nursing home to see when he pulled the catheter out and if there had been an issue with the mittens however I was unable to get in touch with them.  Level 5 caveat for dementia  HPI     Past Medical History:  Diagnosis Date  . Arthritis   . Hypertension     Patient Active Problem List   Diagnosis Date Noted  . AKI (acute kidney injury) (HCC) 03/11/2020  . CKD (chronic kidney disease) stage 3, GFR 30-59 ml/min (HCC) 03/11/2020  . HOH (hard of hearing) 03/11/2020  . Mild dementia (HCC) 03/11/2020  . Bladder outflow obstruction 03/11/2020  . Palliative care by specialist   . Goals of care, counseling/discussion   . DNR (do not resuscitate)   . Sepsis (HCC) 03/03/2020  . Urinary retention   . Altered mental status   . Dehydration 02/04/2020  . Arthritis 12/30/2018  . Hyperlipidemia 12/30/2018  . Hypertension 12/30/2018  . Essential hypertension 12/30/2018  . Syncope and collapse 12/30/2018  . BPH (benign prostatic hyperplasia) 04/10/2017  . Chronic right shoulder pain 04/24/2015  . Memory impairment 04/24/2015  . Tremors of nervous system 04/24/2015  . Seizures (HCC) 12/21/2014    No past surgical history on file.     No family history on file.  Social History   Tobacco Use  . Smoking status: Never Smoker  . Smokeless tobacco: Current User    Types: Chew  Vaping Use  . Vaping Use: Never used  Substance Use Topics  .  Alcohol use: Yes    Alcohol/week: 3.0 standard drinks    Types: 3 Cans of beer per week  . Drug use: Never    Home Medications Prior to Admission medications   Medication Sig Start Date End Date Taking? Authorizing Provider  acetaminophen (TYLENOL) 325 MG tablet Take 650 mg by mouth every 6 (six) hours as needed for mild pain, fever or headache.    [provider]  amLODipine (NORVASC) 10 MG tablet Take 10 mg by mouth daily.    [provider]  aspirin EC 81 MG tablet Take 81 mg by mouth daily.     [provider]  cephALEXin (KEFLEX) 500 MG capsule Take 1 capsule (500 mg total) by mouth 4 (four) times daily. 04/02/20   Melene Plan, DO  feeding supplement, ENSURE ENLIVE, (ENSURE ENLIVE) LIQD Take 237 mLs by mouth 3 (three) times daily after meals. Immediately after a meal. No strawberry 03/11/20   Calvert Cantor, MD  finasteride (PROSCAR) 5 MG tablet Take 1 tablet (5 mg total) by mouth daily. 03/12/20   Calvert Cantor, MD  omeprazole (PRILOSEC) 20 MG capsule Take 20 mg by mouth at bedtime.    [provider]  polyethylene glycol (MIRALAX / GLYCOLAX) 17 g packet Take 17 g by mouth daily.    [provider]  QUEtiapine (SEROQUEL) 25 MG tablet Take 0.5 tablets (12.5 mg total) by  mouth at bedtime. 03/11/20   Calvert Cantor, MD  tamsulosin (FLOMAX) 0.4 MG CAPS capsule Take 0.4 mg by mouth 2 (two) times daily.  11/30/14   [provider]  zolpidem (AMBIEN) 5 MG tablet Take 5 mg by mouth at bedtime. 03/14/20   [provider]    Allergies    Orange juice Erskine Emery oil]  Review of Systems   Review of Systems  Unable to perform ROS: Dementia    Physical Exam Updated Vital Signs BP 119/71 (BP Location: Right Arm)   Pulse 88   Temp 98.9 F (37.2 C) (Oral)   Resp 20   SpO2 98%   Physical Exam Vitals and nursing note reviewed. Exam conducted with a chaperone present.  Constitutional:      General: He is not in acute distress.     Appearance: He is well-developed. He is not diaphoretic.  HENT:     Head: Normocephalic and atraumatic.  Eyes:     General: No scleral icterus.       Right eye: No discharge.        Left eye: No discharge.     Conjunctiva/sclera: Conjunctivae normal.  Cardiovascular:     Rate and Rhythm: Normal rate and regular rhythm.  Pulmonary:     Effort: Pulmonary effort is normal. No respiratory distress.     Breath sounds: No stridor.  Abdominal:     General: There is no distension.  Genitourinary:    Penis: Uncircumcised.      Comments: There is ecchymosis at the base of the penis and mild ecchymosis along the shaft of the penis and extending down into the right sided scrotum.  There is no palpable hematoma mass or swelling in the scrotum.  There is a small amount of clotted blood at the urethral meatus with a skin tear on the inferior aspect.  There is blood in his brief.  After exam foreskin is replaced to normal anatomic position. Musculoskeletal:        General: No deformity.     Cervical back: Normal range of motion.  Skin:    General: Skin is warm and dry.  Neurological:     Mental Status: He is alert.     Motor: No abnormal muscle tone.  Psychiatric:        Behavior: Behavior normal.      ED Results / Procedures / Treatments   Labs (all labs ordered are listed, but only abnormal results are displayed) Labs Reviewed  URINALYSIS, ROUTINE W REFLEX MICROSCOPIC - Abnormal; Notable for the following components:      Result Value   APPearance HAZY (*)    Hgb urine dipstick LARGE (*)    RBC / HPF >50 (*)    All other components within normal limits  CBC WITH DIFFERENTIAL/PLATELET - Abnormal; Notable for the following components:   RBC 2.69 (*)    Hemoglobin 8.5 (*)    HCT 26.7 (*)    All other components within normal limits  COMPREHENSIVE METABOLIC PANEL - Abnormal; Notable for the following components:   CO2 21 (*)    Glucose, Bld 104 (*)    BUN 38 (*)    Creatinine, Ser  2.26 (*)    GFR, Estimated 28 (*)    All other components within normal limits  URINE CULTURE    EKG None  Radiology No results found.  Procedures Procedures (including critical care time)  Medications Ordered in ED Medications - No data to display  ED  Course  I have reviewed the triage vital signs and the nursing notes.  Pertinent labs & imaging results that were available during my care of the patient were reviewed by me and considered in my medical decision making (see chart for details).  Clinical Course as of Apr 08 2323  Sat Apr 07, 2020  1729 I attempted to speak with staff at [facility], nurse who was taking care of patient today didn't answer the phone.  Unable to clarify when he pulled the catheter or if he was wearing mittens.    [EH]    Clinical Course User Index [EH] Norman Clay   MDM Rules/Calculators/A&P                         Patient is an 86 year old man who presents today for evaluation after he reportedly pulled out his catheter.  He has been seen multiple times in the past month for the same.  When he was discharged last time he was discharged with nonviolent restraints has mittens on his bilateral hands.  Attempted to contact at the facility however I was unable to get in touch with his nurse.    On exam he has ecchymosis on the penis, scrotum and the base of the penis.  Foley was able to be placed by nurse without noted significant difficulty.  He does not take any blood thinners.  He does not have evidence of significant scrotal hematoma or other signs of significant bleeding.   CBC shows hemoglobin is low at 8.5, this is down from 2 weeks ago.  I suspect that this is primarily due to him pulling his catheter out with balloon inflated multiple times due to his dementia.  CMP is unchanged from baseline.  UA is sent with large blood over 50 reds and no bacteria.  Urine culture is sent.  I had attempted to contact facility to discuss  follow-up and need for patient to wear nonviolent restraints/mittens to keep him from pulling out his catheter again, however I was unable to speak with his nurse.  Written instructions are given on discharge paper along with the need for timely follow up with PCP for recheck of hemoglobin.   He is discharged with mitten non violent restraints on.   Due to patients dementia unable to give him return precautions.   Note: Portions of this report may have been transcribed using voice recognition software. Every effort was made to ensure accuracy; however, inadvertent computerized transcription errors may be present  Final Clinical Impression(s) / ED Diagnoses Final diagnoses:  Urinary catheter complication, initial encounter [facility])    Rx / DC Orders ED Discharge Orders    None       Cristina Gong, PA-C 04/07/20 2325    Charlynne Pander, MD 04/08/20 1452

## 2020-04-07 NOTE — Discharge Instructions (Addendum)
Please have him wear the mittens if possible to keep him from pulling out his catheter.   He needs to be seen in the next week for repeat CBC as his hemoglobin has dropped and I suspect this is from blood loss from pulling out his catheter.

## 2020-04-07 NOTE — ED Triage Notes (Signed)
Patient from [place]. Patient pull out his foley catheter today. Hx of dementia.

## 2020-04-07 NOTE — ED Notes (Addendum)
Foley catheter inserted and non-violent restraints place (Mitten bilateral hands)

## 2020-04-10 LAB — URINE CULTURE: Culture: NO GROWTH

## 2020-04-25 ENCOUNTER — Emergency Department (HOSPITAL_COMMUNITY): Payer: Medicare HMO

## 2020-04-25 ENCOUNTER — Encounter (HOSPITAL_COMMUNITY): Payer: Self-pay

## 2020-04-25 ENCOUNTER — Inpatient Hospital Stay (HOSPITAL_COMMUNITY): Payer: Medicare HMO

## 2020-04-25 ENCOUNTER — Inpatient Hospital Stay (HOSPITAL_COMMUNITY)
Admission: EM | Admit: 2020-04-25 | Discharge: 2020-04-30 | DRG: 698 | Disposition: A | Discharge status: Hospice | Payer: Medicare HMO | Source: Skilled Nursing Facility | Attending: Internal Medicine | Admitting: Internal Medicine

## 2020-04-25 DIAGNOSIS — N138 Other obstructive and reflux uropathy: Secondary | ICD-10-CM | POA: Diagnosis present

## 2020-04-25 DIAGNOSIS — I129 Hypertensive chronic kidney disease with stage 1 through stage 4 chronic kidney disease, or unspecified chronic kidney disease: Secondary | ICD-10-CM | POA: Diagnosis present

## 2020-04-25 DIAGNOSIS — Z91018 Allergy to other foods: Secondary | ICD-10-CM | POA: Diagnosis not present

## 2020-04-25 DIAGNOSIS — N1831 Chronic kidney disease, stage 3a: Secondary | ICD-10-CM | POA: Diagnosis present

## 2020-04-25 DIAGNOSIS — Z6826 Body mass index (BMI) 26.0-26.9, adult: Secondary | ICD-10-CM

## 2020-04-25 DIAGNOSIS — N390 Urinary tract infection, site not specified: Secondary | ICD-10-CM | POA: Diagnosis present

## 2020-04-25 DIAGNOSIS — Z515 Encounter for palliative care: Secondary | ICD-10-CM

## 2020-04-25 DIAGNOSIS — Y846 Urinary catheterization as the cause of abnormal reaction of the patient, or of later complication, without mention of misadventure at the time of the procedure: Secondary | ICD-10-CM | POA: Diagnosis present

## 2020-04-25 DIAGNOSIS — R4182 Altered mental status, unspecified: Secondary | ICD-10-CM

## 2020-04-25 DIAGNOSIS — E870 Hyperosmolality and hypernatremia: Secondary | ICD-10-CM | POA: Diagnosis present

## 2020-04-25 DIAGNOSIS — R339 Retention of urine, unspecified: Secondary | ICD-10-CM | POA: Diagnosis present

## 2020-04-25 DIAGNOSIS — Z89411 Acquired absence of right great toe: Secondary | ICD-10-CM

## 2020-04-25 DIAGNOSIS — Z79899 Other long term (current) drug therapy: Secondary | ICD-10-CM

## 2020-04-25 DIAGNOSIS — Z20822 Contact with and (suspected) exposure to covid-19: Secondary | ICD-10-CM | POA: Diagnosis present

## 2020-04-25 DIAGNOSIS — H9190 Unspecified hearing loss, unspecified ear: Secondary | ICD-10-CM | POA: Diagnosis present

## 2020-04-25 DIAGNOSIS — Z1612 Extended spectrum beta lactamase (ESBL) resistance: Secondary | ICD-10-CM | POA: Diagnosis present

## 2020-04-25 DIAGNOSIS — E860 Dehydration: Secondary | ICD-10-CM | POA: Diagnosis present

## 2020-04-25 DIAGNOSIS — F05 Delirium due to known physiological condition: Secondary | ICD-10-CM | POA: Diagnosis present

## 2020-04-25 DIAGNOSIS — R627 Adult failure to thrive: Secondary | ICD-10-CM | POA: Diagnosis present

## 2020-04-25 DIAGNOSIS — E878 Other disorders of electrolyte and fluid balance, not elsewhere classified: Secondary | ICD-10-CM | POA: Diagnosis present

## 2020-04-25 DIAGNOSIS — I10 Essential (primary) hypertension: Secondary | ICD-10-CM | POA: Diagnosis present

## 2020-04-25 DIAGNOSIS — E876 Hypokalemia: Secondary | ICD-10-CM | POA: Diagnosis present

## 2020-04-25 DIAGNOSIS — A419 Sepsis, unspecified organism: Secondary | ICD-10-CM

## 2020-04-25 DIAGNOSIS — Z7982 Long term (current) use of aspirin: Secondary | ICD-10-CM

## 2020-04-25 DIAGNOSIS — E785 Hyperlipidemia, unspecified: Secondary | ICD-10-CM | POA: Diagnosis present

## 2020-04-25 DIAGNOSIS — G9341 Metabolic encephalopathy: Secondary | ICD-10-CM | POA: Diagnosis present

## 2020-04-25 DIAGNOSIS — N401 Enlarged prostate with lower urinary tract symptoms: Secondary | ICD-10-CM | POA: Diagnosis present

## 2020-04-25 DIAGNOSIS — B965 Pseudomonas (aeruginosa) (mallei) (pseudomallei) as the cause of diseases classified elsewhere: Secondary | ICD-10-CM | POA: Diagnosis present

## 2020-04-25 DIAGNOSIS — T83511A Infection and inflammatory reaction due to indwelling urethral catheter, initial encounter: Secondary | ICD-10-CM | POA: Diagnosis not present

## 2020-04-25 DIAGNOSIS — N179 Acute kidney failure, unspecified: Secondary | ICD-10-CM | POA: Diagnosis present

## 2020-04-25 DIAGNOSIS — N183 Chronic kidney disease, stage 3 unspecified: Secondary | ICD-10-CM | POA: Diagnosis present

## 2020-04-25 DIAGNOSIS — A4151 Sepsis due to Escherichia coli [E. coli]: Secondary | ICD-10-CM | POA: Diagnosis present

## 2020-04-25 DIAGNOSIS — R400 Somnolence: Secondary | ICD-10-CM | POA: Diagnosis not present

## 2020-04-25 DIAGNOSIS — R1310 Dysphagia, unspecified: Secondary | ICD-10-CM | POA: Diagnosis present

## 2020-04-25 DIAGNOSIS — B9620 Unspecified Escherichia coli [E. coli] as the cause of diseases classified elsewhere: Secondary | ICD-10-CM | POA: Diagnosis not present

## 2020-04-25 DIAGNOSIS — R7881 Bacteremia: Secondary | ICD-10-CM | POA: Diagnosis not present

## 2020-04-25 DIAGNOSIS — F0391 Unspecified dementia with behavioral disturbance: Secondary | ICD-10-CM | POA: Diagnosis not present

## 2020-04-25 DIAGNOSIS — Z66 Do not resuscitate: Secondary | ICD-10-CM | POA: Diagnosis present

## 2020-04-25 DIAGNOSIS — Z7189 Other specified counseling: Secondary | ICD-10-CM | POA: Diagnosis not present

## 2020-04-25 DIAGNOSIS — F17220 Nicotine dependence, chewing tobacco, uncomplicated: Secondary | ICD-10-CM | POA: Diagnosis present

## 2020-04-25 DIAGNOSIS — F0390 Unspecified dementia without behavioral disturbance: Secondary | ICD-10-CM | POA: Diagnosis present

## 2020-04-25 DIAGNOSIS — R9431 Abnormal electrocardiogram [ECG] [EKG]: Secondary | ICD-10-CM | POA: Diagnosis present

## 2020-04-25 LAB — LACTIC ACID, PLASMA
Lactic Acid, Venous: 2.1 mmol/L (ref 0.5–1.9)
Lactic Acid, Venous: 1.1 mmol/L (ref 0.5–1.9)

## 2020-04-25 LAB — CBC WITH DIFFERENTIAL/PLATELET
Abs Immature Granulocytes: 0.71 10*3/uL — ABNORMAL HIGH (ref 0.00–0.07)
Basophils Absolute: 0 10*3/uL (ref 0.0–0.1)
Basophils Relative: 0 %
Eosinophils Absolute: 0 10*3/uL (ref 0.0–0.5)
Eosinophils Relative: 0 %
HCT: 33.7 % — ABNORMAL LOW (ref 39.0–52.0)
Hemoglobin: 10.6 g/dL — ABNORMAL LOW (ref 13.0–17.0)
Immature Granulocytes: 6 %
Lymphocytes Relative: 8 %
Lymphs Abs: 0.9 10*3/uL (ref 0.7–4.0)
MCH: 30.3 pg (ref 26.0–34.0)
MCHC: 31.5 g/dL (ref 30.0–36.0)
MCV: 96.3 fL (ref 80.0–100.0)
Monocytes Absolute: 1.3 10*3/uL — ABNORMAL HIGH (ref 0.1–1.0)
Monocytes Relative: 11 %
Neutro Abs: 8.6 10*3/uL — ABNORMAL HIGH (ref 1.7–7.7)
Neutrophils Relative %: 75 %
Platelets: 346 10*3/uL (ref 150–400)
RBC: 3.5 MIL/uL — ABNORMAL LOW (ref 4.22–5.81)
RDW: 15.5 % (ref 11.5–15.5)
WBC: 11.6 10*3/uL — ABNORMAL HIGH (ref 4.0–10.5)
nRBC: 0 % (ref 0.0–0.2)

## 2020-04-25 LAB — COMPREHENSIVE METABOLIC PANEL
ALT: 23 U/L (ref 0–44)
AST: 26 U/L (ref 15–41)
Albumin: 3.4 g/dL — ABNORMAL LOW (ref 3.5–5.0)
Alkaline Phosphatase: 91 U/L (ref 38–126)
Anion gap: 15 (ref 5–15)
BUN: 92 mg/dL — ABNORMAL HIGH (ref 8–23)
CO2: 20 mmol/L — ABNORMAL LOW (ref 22–32)
Calcium: 10.5 mg/dL — ABNORMAL HIGH (ref 8.9–10.3)
Chloride: 115 mmol/L — ABNORMAL HIGH (ref 98–111)
Creatinine, Ser: 4.16 mg/dL — ABNORMAL HIGH (ref 0.61–1.24)
GFR, Estimated: 13 mL/min — ABNORMAL LOW (ref >60)
Glucose, Bld: 133 mg/dL — ABNORMAL HIGH (ref 70–99)
Potassium: 3.7 mmol/L (ref 3.5–5.1)
Sodium: 150 mmol/L — ABNORMAL HIGH (ref 135–145)
Total Bilirubin: 0.6 mg/dL (ref 0.3–1.2)
Total Protein: 7.4 g/dL (ref 6.5–8.1)

## 2020-04-25 LAB — LIPASE, BLOOD: Lipase: 23 U/L (ref 11–51)

## 2020-04-25 LAB — TROPONIN I (HIGH SENSITIVITY)
Troponin I (High Sensitivity): 83 ng/L — ABNORMAL HIGH (ref <18)
Troponin I (High Sensitivity): 76 ng/L — ABNORMAL HIGH (ref <18)

## 2020-04-25 LAB — URINALYSIS, ROUTINE W REFLEX MICROSCOPIC
Bilirubin Urine: NEGATIVE
Glucose, UA: NEGATIVE mg/dL
Ketones, ur: NEGATIVE mg/dL
Nitrite: NEGATIVE
Protein, ur: 100 mg/dL — AB
RBC / HPF: >50 RBC/hpf — ABNORMAL HIGH (ref 0–5)
Specific Gravity, Urine: 1.011 (ref 1.005–1.030)
WBC, UA: >50 WBC/hpf — ABNORMAL HIGH (ref 0–5)
pH: 6 (ref 5.0–8.0)

## 2020-04-25 LAB — TYPE AND SCREEN
ABO/RH(D): O POS
Antibody Screen: NEGATIVE

## 2020-04-25 LAB — RESPIRATORY PANEL BY RT PCR (FLU A&B, COVID)
Influenza A by PCR: NEGATIVE
Influenza B by PCR: NEGATIVE
SARS Coronavirus 2 by RT PCR: NEGATIVE

## 2020-04-25 LAB — PROTIME-INR
INR: 1.2 (ref 0.8–1.2)
Prothrombin Time: 14.6 seconds (ref 11.4–15.2)

## 2020-04-25 MED ORDER — SODIUM CHLORIDE 0.9 % IV SOLN: INTRAVENOUS | Status: DC

## 2020-04-25 MED ORDER — SODIUM CHLORIDE 0.9 % IV BOLUS
1000.0000 mL | Freq: Once | INTRAVENOUS | Status: AC
  Administered 2020-04-25: 1000 mL via INTRAVENOUS

## 2020-04-25 MED ORDER — ASPIRIN EC 81 MG PO TBEC
81.0000 mg | DELAYED_RELEASE_TABLET | Freq: Every day | ORAL | Status: DC
  Filled 2020-04-25: qty 1

## 2020-04-25 MED ORDER — ALPRAZOLAM 1 MG PO TABS
1.0000 mg | ORAL_TABLET | Freq: Two times a day (BID) | ORAL | Status: DC | PRN
  Administered 2020-04-27: 1 mg via ORAL
  Filled 2020-04-25: qty 1

## 2020-04-25 MED ORDER — SODIUM CHLORIDE 0.9 % IV SOLN
1.0000 g | INTRAVENOUS | Status: DC
  Administered 2020-04-25: 1 g via INTRAVENOUS
  Filled 2020-04-25: qty 1

## 2020-04-25 MED ORDER — PIPERACILLIN-TAZOBACTAM 3.375 G IVPB 30 MIN
3.3750 g | Freq: Once | INTRAVENOUS | Status: AC
  Administered 2020-04-25: 3.375 g via INTRAVENOUS
  Filled 2020-04-25: qty 50

## 2020-04-25 MED ORDER — ACETAMINOPHEN 650 MG RE SUPP: 650.0000 mg | Freq: Four times a day (QID) | RECTAL | Status: DC | PRN

## 2020-04-25 MED ORDER — SODIUM CHLORIDE 0.9 % IV BOLUS (SEPSIS)
1500.0000 mL | Freq: Once | INTRAVENOUS | Status: AC
  Administered 2020-04-25: 1500 mL via INTRAVENOUS

## 2020-04-25 MED ORDER — FINASTERIDE 5 MG PO TABS
5.0000 mg | ORAL_TABLET | Freq: Every day | ORAL | Status: DC
  Filled 2020-04-25: qty 1

## 2020-04-25 MED ORDER — TAMSULOSIN HCL 0.4 MG PO CAPS
0.4000 mg | ORAL_CAPSULE | Freq: Two times a day (BID) | ORAL | Status: DC
  Administered 2020-04-26: 0.4 mg via ORAL
  Filled 2020-04-25 (×3): qty 1

## 2020-04-25 MED ORDER — ACETAMINOPHEN 325 MG PO TABS
650.0000 mg | ORAL_TABLET | Freq: Four times a day (QID) | ORAL | Status: DC | PRN
  Administered 2020-04-26 – 2020-04-27 (×2): 650 mg via ORAL
  Filled 2020-04-25 (×3): qty 2

## 2020-04-25 NOTE — Progress Notes (Signed)
Received call from ED RN Carollee Herter at 2030 for report as I was finishing up giving meds in another patients room. I requested a call back in 5 minutes to allow me to administer the medications. As I was walking out of the room at 2034, the charge nurse Bonita Quin) had the ED on the phone to give report. I was able to take report at that time from [facility]. Patient had been pending since before the start of my shift.

## 2020-04-25 NOTE — H&P (Signed)
History and Physical    Christian Warren. ZOX:[id number] DOB: 19-Jun-1932 DOA: 04/25/2020  PCP: Retia Passe, NP  Patient coming from: [facility]  Chief Complaint: pain  HPI: Christian Warren. is a 86 y.o. male with medical history significant of dementia, HTN, BPH, chronic indwelling foley. Pt is currently altered. Hx is from EDP report and chart review. Patient has been more altered at his facility over the last few days. He is normally able to talk, ambulate with walker, and feed himself. Over the last few days, he has become increasing lethargic with intermittent complaints of abdominal pain. EMS was called today and he was found to be tachycardic and confused. He was transported to [facility].   ED Course: ED w/u revealed hypernatremia, AKI, and a possible UTI. He was given fluids and started on zosyn. His chronic indwelling foley was changed. TRH was called for admission.    Review of Systems:  Unable to obtain d/t mentation.  PMHx Past Medical History:  Diagnosis Date  . Arthritis   . Hypertension     PSHx History reviewed. No pertinent surgical history.  SocHx  reports that he has never smoked. His smokeless tobacco use includes chew. He reports current alcohol use of about 3.0 standard drinks of alcohol per week. He reports that he does not use drugs.  Allergies  Allergen Reactions  . Orange Juice [Orange Oil] Itching    FamHx History reviewed. No pertinent family history.  Prior to Admission medications   Medication Sig Start Date End Date Taking? Authorizing Provider  acetaminophen (TYLENOL) 325 MG tablet Take 650 mg by mouth every 6 (six) hours as needed for mild pain, fever or headache.   Yes [provider]  ALPRAZolam Prudy Feeler) 1 MG tablet Take 1 mg by mouth 2 (two) times daily as needed for agitation. 04/21/20  Yes [provider]  aspirin EC 81 MG tablet Take 81 mg by mouth daily.    Yes [provider]  feeding supplement, ENSURE ENLIVE,  (ENSURE ENLIVE) LIQD Take 237 mLs by mouth 3 (three) times daily after meals. Immediately after a meal. No strawberry 03/11/20  Yes Rizwan, Ladell Heads, MD  finasteride (PROSCAR) 5 MG tablet Take 1 tablet (5 mg total) by mouth daily. 03/12/20  Yes Calvert Cantor, MD  omeprazole (PRILOSEC) 20 MG capsule Take 20 mg by mouth at bedtime.   Yes [provider]  OVER THE COUNTER MEDICATION Take 1 tablet by mouth See admin instructions. Taking 1 tab on day 5 of each month. TSH (Thyroid stimulating Hormone)   Yes [provider]  polyethylene glycol (MIRALAX / GLYCOLAX) 17 g packet Take 17 g by mouth daily.   Yes [provider]  tamsulosin (FLOMAX) 0.4 MG CAPS capsule Take 0.4 mg by mouth 2 (two) times daily.  11/30/14  Yes [provider]  amLODipine (NORVASC) 10 MG tablet Take 10 mg by mouth daily.    [provider]  cephALEXin (KEFLEX) 500 MG capsule Take 1 capsule (500 mg total) by mouth 4 (four) times daily. Patient not taking: Reported on 04/25/2020 04/02/20   Melene Plan, DO  QUEtiapine (SEROQUEL) 25 MG tablet Take 0.5 tablets (12.5 mg total) by mouth at bedtime. 03/11/20   Calvert Cantor, MD  zolpidem (AMBIEN) 5 MG tablet Take 5 mg by mouth at bedtime. 03/14/20   [provider]    Physical Exam: Vitals:   04/25/20 1330 04/25/20 1400 04/25/20 1415 04/25/20 1430  BP: 137/80 (!) 131/98 (!) 139/127  112/69  Pulse: (!) 102 (!) 115 (!) 115 (!) 105  Resp: 14  17 18   Temp:      TempSrc:      SpO2: 100% 100% 100% 100%    General: 86 y.o. male resting in bed in NAD Eyes: PERRL, normal sclera ENMT: Nares patent w/o discharge, orophaynx clear, dentition normal, ears w/o discharge/lesions/ulcers Neck: Supple, trachea midline Cardiovascular: tachy, +S1, S2, no m/g/r, equal pulses throughout Respiratory: CTABL, no w/r/r, normal WOB GI: BS+, NDNT, no masses noted, no organomegaly noted MSK: No e/c/c Skin: No rashes, bruises, ulcerations noted Neuro: rouses  to noxious stimuli but does not follow commands  Labs on Admission: I have personally reviewed following labs and imaging studies  CBC: Recent Labs  Lab 04/25/20 1137  WBC 11.6*  NEUTROABS 8.6*  HGB 10.6*  HCT 33.7*  MCV 96.3  PLT 346   Basic Metabolic Panel: Recent Labs  Lab 04/25/20 1137  NA 150*  K 3.7  CL 115*  CO2 20*  GLUCOSE 133*  BUN 92*  CREATININE 4.16*  CALCIUM 10.5*   GFR: CrCl cannot be calculated (Unknown ideal weight.). Liver Function Tests: Recent Labs  Lab 04/25/20 1137  AST 26  ALT 23  ALKPHOS 91  BILITOT 0.6  PROT 7.4  ALBUMIN 3.4*   Recent Labs  Lab 04/25/20 1137  LIPASE 23   No results for input(s): AMMONIA in the last 168 hours. Coagulation Profile: Recent Labs  Lab 04/25/20 1137  INR 1.2   Cardiac Enzymes: No results for input(s): CKTOTAL, CKMB, CKMBINDEX, TROPONINI in the last 168 hours. BNP (last 3 results) No results for input(s): PROBNP in the last 8760 hours. HbA1C: No results for input(s): HGBA1C in the last 72 hours. CBG: No results for input(s): GLUCAP in the last 168 hours. Lipid Profile: No results for input(s): CHOL, HDL, LDLCALC, TRIG, CHOLHDL, LDLDIRECT in the last 72 hours. Thyroid Function Tests: No results for input(s): TSH, T4TOTAL, FREET4, T3FREE, THYROIDAB in the last 72 hours. Anemia Panel: No results for input(s): VITAMINB12, FOLATE, FERRITIN, TIBC, IRON, RETICCTPCT in the last 72 hours. Urine analysis:    Component Value Date/Time   COLORURINE YELLOW 04/25/2020 1137   APPEARANCEUR TURBID (A) 04/25/2020 1137   LABSPEC 1.011 04/25/2020 1137   PHURINE 6.0 04/25/2020 1137   GLUCOSEU NEGATIVE 04/25/2020 1137   HGBUR SMALL (A) 04/25/2020 1137   BILIRUBINUR NEGATIVE 04/25/2020 1137   KETONESUR NEGATIVE 04/25/2020 1137   PROTEINUR 100 (A) 04/25/2020 1137   NITRITE NEGATIVE 04/25/2020 1137   LEUKOCYTESUR LARGE (A) 04/25/2020 1137    Radiological Exams on Admission: CT Head Wo Contrast  Result  Date: 04/25/2020 CLINICAL DATA:  Mental status change. Nonverbal. Lethargy. Renal insufficiency. EXAM: CT HEAD WITHOUT CONTRAST TECHNIQUE: Contiguous axial images were obtained from the base of the skull through the vertex without intravenous contrast. COMPARISON:  03/03/2020 FINDINGS: Brain: Advanced cerebral/cerebellar atrophy. Marked low density in the periventricular white matter likely related to small vessel disease. No mass lesion, hemorrhage, hydrocephalus, acute infarct, intra-axial, or extra-axial fluid collection. Vascular: Intracranial atherosclerosis. Skull: Motion degradation.  No significant soft tissue swelling. Sinuses/Orbits: Normal imaged portions of the orbits and globes. Clear paranasal sinuses and mastoid air cells. Other: None. IMPRESSION: 1. Motion degradation. 2. No acute intracranial abnormality. 3. Cerebral atrophy and small vessel ischemic change. Electronically Signed   By: 03/05/2020 M.D.   On: 04/25/2020 14:30   DG Chest Port 1 View  Result Date: 04/25/2020 CLINICAL DATA:  Weakness. EXAM: PORTABLE CHEST  1 VIEW COMPARISON:  Chest radiograph 03/03/2020 and earlier. Chest CT 02/04/2020. FINDINGS: Shallow inspiration radiograph. Heart size at the upper limits of normal, unchanged. Aortic atherosclerosis. No appreciable airspace consolidation or pulmonary edema. No evidence of pleural effusion or pneumothorax. No acute bony abnormality identified. IMPRESSION: Shallow inspiration radiograph. No evidence of acute cardiopulmonary abnormality. Aortic Atherosclerosis (ICD10-I70.0). Electronically Signed   By: Jackey Loge DO   On: 04/25/2020 10:49    Assessment/Plan UTI AMS Dementia     - admit to inpt, med-surg     - change abx to rocephin for UTI; UCx pending     - continue fluids     - monitor mentation as infection status improves     - holding home seroquel d/t lethargy  AKI     - fluids; follow I&O  Hypernatremia Dehydration     - fluids, follow AM labs  BPH w/  chronic indwelling foley     - foley has been replaced in ED     - continue flomax, proscar  HTN     - hold home meds for right now  DVT prophylaxis: SCDs  Code Status: DNR; confirmed with niece by phone  Family Communication: Niece by phone  Consults called: None   Status is: Inpatient  Remains inpatient appropriate because:Inpatient level of care appropriate due to severity of illness   Dispo: The patient is from: SNF              Anticipated d/c is to: SNF              Anticipated d/c date is: 3 days              Patient currently is not medically stable to d/c.  Teddy Spike DO [facility]  If 7PM-7AM, please contact night-coverage [email or web address]  04/25/2020, 3:22 PM

## 2020-04-25 NOTE — ED Notes (Signed)
Hourly rounding complete. Pt resting comfortably with bed in lowest position and side rails up x2. Vitals updated. All needs met at this time. Will continue to monitor. 

## 2020-04-25 NOTE — ED Provider Notes (Signed)
[facility]-EMERGENCY DEPT Provider Note   CSN: [id number] Arrival date & time: 04/25/20  1005     History Chief Complaint  Patient presents with  . Abdominal Pain  . Altered Mental Status    Christian Warren. is a 86 y.o. male.  Level 5 caveat secondary altered mental status.  Patient is baseline able to talk and feet himself, ambulatory with walker.  For the last few days he has not been eating and staff called EMS today for possible abdominal pain and concerns for decreased mental status.  No reported trauma.  EMS said was tachycardic in route which improved with some IV fluids.  Patient himself can give no history and just moans when examined.  The history is provided by the EMS personnel and the patient.  Altered Mental Status Presenting symptoms: lethargy and partial responsiveness   Most recent episode:  Today Episode history:  Single Timing:  Constant Progression:  Unchanged Chronicity:  New Context: nursing home resident   Associated symptoms: abdominal pain        Past Medical History:  Diagnosis Date  . Arthritis   . Hypertension     Patient Active Problem List   Diagnosis Date Noted  . AKI (acute kidney injury) (HCC) 03/11/2020  . CKD (chronic kidney disease) stage 3, GFR 30-59 ml/min (HCC) 03/11/2020  . HOH (hard of hearing) 03/11/2020  . Mild dementia (HCC) 03/11/2020  . Bladder outflow obstruction 03/11/2020  . Palliative care by specialist   . Goals of care, counseling/discussion   . DNR (do not resuscitate)   . Sepsis (HCC) 03/03/2020  . Urinary retention   . Altered mental status   . Dehydration 02/04/2020  . Arthritis 12/30/2018  . Hyperlipidemia 12/30/2018  . Hypertension 12/30/2018  . Essential hypertension 12/30/2018  . Syncope and collapse 12/30/2018  . BPH (benign prostatic hyperplasia) 04/10/2017  . Chronic right shoulder pain 04/24/2015  . Memory impairment 04/24/2015  . Tremors of nervous system 04/24/2015  .  Seizures (HCC) 12/21/2014    History reviewed. No pertinent surgical history.     History reviewed. No pertinent family history.  Social History   Tobacco Use  . Smoking status: Never Smoker  . Smokeless tobacco: Current User    Types: Chew  Vaping Use  . Vaping Use: Never used  Substance Use Topics  . Alcohol use: Yes    Alcohol/week: 3.0 standard drinks    Types: 3 Cans of beer per week  . Drug use: Never    Home Medications Prior to Admission medications   Medication Sig Start Date End Date Taking? Authorizing Provider  acetaminophen (TYLENOL) 325 MG tablet Take 650 mg by mouth every 6 (six) hours as needed for mild pain, fever or headache.    [provider]  amLODipine (NORVASC) 10 MG tablet Take 10 mg by mouth daily.    [provider]  aspirin EC 81 MG tablet Take 81 mg by mouth daily.     [provider]  cephALEXin (KEFLEX) 500 MG capsule Take 1 capsule (500 mg total) by mouth 4 (four) times daily. 04/02/20   Melene Plan, DO  feeding supplement, ENSURE ENLIVE, (ENSURE ENLIVE) LIQD Take 237 mLs by mouth 3 (three) times daily after meals. Immediately after a meal. No strawberry 03/11/20   Calvert Cantor, MD  finasteride (PROSCAR) 5 MG tablet Take 1 tablet (5 mg total) by mouth daily. 03/12/20   Calvert Cantor, MD  omeprazole (PRILOSEC) 20 MG capsule Take 20 mg  by mouth at bedtime.    [provider]  polyethylene glycol (MIRALAX / GLYCOLAX) 17 g packet Take 17 g by mouth daily.    [provider]  QUEtiapine (SEROQUEL) 25 MG tablet Take 0.5 tablets (12.5 mg total) by mouth at bedtime. 03/11/20   Calvert Cantor, MD  tamsulosin (FLOMAX) 0.4 MG CAPS capsule Take 0.4 mg by mouth 2 (two) times daily.  11/30/14   [provider]  zolpidem (AMBIEN) 5 MG tablet Take 5 mg by mouth at bedtime. 03/14/20   [provider]    Allergies    Orange juice [orange oil]  Review of Systems   Review of Systems  Unable to perform  ROS: Mental status change  Gastrointestinal: Positive for abdominal pain.    Physical Exam Updated Vital Signs BP 120/71   Pulse (!) 112   Temp 97.7 F (36.5 C) (Oral)   Resp 18   SpO2 96%   Physical Exam Vitals and nursing note reviewed.  Constitutional:      Appearance: He is well-developed.  HENT:     Head: Normocephalic and atraumatic.  Eyes:     Conjunctiva/sclera: Conjunctivae normal.  Cardiovascular:     Rate and Rhythm: Regular rhythm. Tachycardia present.     Heart sounds: No murmur heard.   Pulmonary:     Effort: Pulmonary effort is normal. No respiratory distress.     Breath sounds: Normal breath sounds.  Abdominal:     Palpations: Abdomen is soft.     Tenderness: There is no abdominal tenderness.  Musculoskeletal:        General: No deformity.     Cervical back: Neck supple.     Comments: He has some old abrasions and scabs on his lower extremities.  Right great toe amputated.  Skin:    General: Skin is warm and dry.  Neurological:     Mental Status: He is lethargic.     GCS: GCS eye subscore is 2. GCS verbal subscore is 2. GCS motor subscore is 5.     Comments: Patient will moan, not answering questions.  Not following commands.  Moving all extremities nonfocally.     ED Results / Procedures / Treatments   Labs (all labs ordered are listed, but only abnormal results are displayed) Labs Reviewed  CBC WITH DIFFERENTIAL/PLATELET - Abnormal; Notable for the following components:      Result Value   WBC 11.6 (*)    RBC 3.50 (*)    Hemoglobin 10.6 (*)    HCT 33.7 (*)    Neutro Abs 8.6 (*)    Monocytes Absolute 1.3 (*)    Abs Immature Granulocytes 0.71 (*)    All other components within normal limits  COMPREHENSIVE METABOLIC PANEL - Abnormal; Notable for the following components:   Sodium 150 (*)    Chloride 115 (*)    CO2 20 (*)    Glucose, Bld 133 (*)    BUN 92 (*)    Creatinine, Ser 4.16 (*)    Calcium 10.5 (*)    Albumin 3.4 (*)    GFR,  Estimated 13 (*)    All other components within normal limits  URINALYSIS, ROUTINE W REFLEX MICROSCOPIC - Abnormal; Notable for the following components:   APPearance TURBID (*)    Hgb urine dipstick SMALL (*)    Protein, ur 100 (*)    Leukocytes,Ua LARGE (*)    RBC / HPF >50 (*)    WBC, UA >50 (*)  Bacteria, UA RARE (*)    All other components within normal limits  LACTIC ACID, PLASMA - Abnormal; Notable for the following components:   Lactic Acid, Venous 2.1 (*)    All other components within normal limits  TROPONIN I (HIGH SENSITIVITY) - Abnormal; Notable for the following components:   Troponin I (High Sensitivity) 83 (*)    All other components within normal limits  TROPONIN I (HIGH SENSITIVITY) - Abnormal; Notable for the following components:   Troponin I (High Sensitivity) 76 (*)    All other components within normal limits  RESPIRATORY PANEL BY RT PCR (FLU A&B, COVID)  URINE CULTURE  CULTURE, BLOOD (ROUTINE X 2)  CULTURE, BLOOD (ROUTINE X 2)  LIPASE, BLOOD  PROTIME-INR  LACTIC ACID, PLASMA  TYPE AND SCREEN  ABO/RH  TROPONIN I (HIGH SENSITIVITY)    EKG EKG Interpretation  Date/Time:  Wednesday April 25 2020 10:29:36 EST Ventricular Rate:  122 PR Interval:    QRS Duration: 88 QT Interval:  395 QTC Calculation: 563 R Axis:   54 Text Interpretation: Atrial fibrillation Low voltage, extremity leads Borderline repolarization abnormality Prolonged QT interval rate increased from prior 10/21 Confirmed by Meridee ScoreButler, Taylen Wendland [phone]([id number]) on 04/25/2020 10:31:28 AM   Radiology CT Head Wo Contrast  Result Date: 04/25/2020 CLINICAL DATA:  Mental status change. Nonverbal. Lethargy. Renal insufficiency. EXAM: CT HEAD WITHOUT CONTRAST TECHNIQUE: Contiguous axial images were obtained from the base of the skull through the vertex without intravenous contrast. COMPARISON:  03/03/2020 FINDINGS: Brain: Advanced cerebral/cerebellar atrophy. Marked low density in the periventricular  white matter likely related to small vessel disease. No mass lesion, hemorrhage, hydrocephalus, acute infarct, intra-axial, or extra-axial fluid collection. Vascular: Intracranial atherosclerosis. Skull: Motion degradation.  No significant soft tissue swelling. Sinuses/Orbits: Normal imaged portions of the orbits and globes. Clear paranasal sinuses and mastoid air cells. Other: None. IMPRESSION: 1. Motion degradation. 2. No acute intracranial abnormality. 3. Cerebral atrophy and small vessel ischemic change. Electronically Signed   By: Jeronimo GreavesKyle  Talbot M.D.   On: 04/25/2020 14:30   DG Chest Port 1 View  Result Date: 04/25/2020 CLINICAL DATA:  Weakness. EXAM: PORTABLE CHEST 1 VIEW COMPARISON:  Chest radiograph 03/03/2020 and earlier. Chest CT 02/04/2020. FINDINGS: Shallow inspiration radiograph. Heart size at the upper limits of normal, unchanged. Aortic atherosclerosis. No appreciable airspace consolidation or pulmonary edema. No evidence of pleural effusion or pneumothorax. No acute bony abnormality identified. IMPRESSION: Shallow inspiration radiograph. No evidence of acute cardiopulmonary abnormality. Aortic Atherosclerosis (ICD10-I70.0). Electronically Signed   By: Jackey LogeKyle  Golden DO   On: 04/25/2020 10:49    Procedures .Critical Care Performed by: Terrilee FilesButler, Nazar Kuan C, MD Authorized by: Terrilee FilesButler, Aldridge Krzyzanowski C, MD   Critical care provider statement:    Critical care time (minutes):  45   Critical care time was exclusive of:  Separately billable procedures and treating other patients   Critical care was necessary to treat or prevent imminent or life-threatening deterioration of the following conditions:  Sepsis   Critical care was time spent personally by me on the following activities:  Discussions with consultants, evaluation of patient's response to treatment, examination of patient, ordering and performing treatments and interventions, ordering and review of laboratory studies, ordering and review of  radiographic studies, pulse oximetry, re-evaluation of patient's condition, obtaining history from patient or surrogate, review of old charts and development of treatment plan with patient or surrogate   (including critical care time)  Medications Ordered in ED Medications  sodium chloride 0.9 % bolus 1,000 mL (  0 mLs Intravenous Stopped 04/25/20 1449)  piperacillin-tazobactam (ZOSYN) IVPB 3.375 g (0 g Intravenous Stopped 04/25/20 1359)  sodium chloride 0.9 % bolus 1,500 mL (0 mLs Intravenous Stopped 04/25/20 1553)    ED Course  I have reviewed the triage vital signs and the nursing notes.  Pertinent labs & imaging results that were available during my care of the patient were reviewed by me and considered in my medical decision making (see chart for details).  Clinical Course as of Apr 25 1744  Wed Apr 25, 2020  1051 Chest x-ray interpreted by me as no acute infiltrates.   [MB]  1118 Reevaluated patient as she now is able to tell me his name.   [MB]  1438 Discussed with [facility] hospital[facility] Dr. Ronaldo Miyamoto who will evaluate the patient for admission.   [MB]    Clinical Course User Index [MB] Terrilee Files, MD   MDM Rules/Calculators/A&P                         This patient complains of lethargy and altered mental status; this involves an extensive number of treatment Options and is a complaint that carries with it a high risk of complications and Morbidity. The differential includes UTI, pyelonephritis, dehydration, stroke, bleed, pneumonia, pneumothorax, intra-abdominal problem, metabolic derangement  I ordered, reviewed and interpreted labs, which included CBC with elevated white count, stable low hemoglobin, this chemistries with elevated sodium and creatinine reflecting some dehydration, new AKI with BUN/creatinine elevated, hyper calcemic, Covid testing negative, urinalysis grossly infected although chronic Foley, blood cultures pending, troponins elevated but flat I ordered  medication IV fluids and IV antibiotics I ordered imaging studies which included chest x-ray, CT head and I independently    visualized and interpreted imaging which showed no acute findings Additional history obtained from EMS Previous records obtained and reviewed in epic, has been admitted before for urosepsis altered mental status I consulted Dr. Ronaldo Miyamoto [facility] and discussed lab and imaging findings  Critical Interventions: Work-up and management of patient's altered mental status/sepsis  After the interventions stated above, I reevaluated the patient and found patient to be hemodynamically improved.  Will need admission for continued work-up and management.   Final Clinical Impression(s) / ED Diagnoses Final diagnoses:  Sepsis with acute renal failure without septic shock, due to unspecified organism, unspecified acute renal failure type (HCC)  Altered mental status, unspecified altered mental status type  AKI (acute kidney injury) (HCC)  Acute hypernatremia    Rx / DC Orders ED Discharge Orders    None       Terrilee Files, MD 04/25/20 1751

## 2020-04-25 NOTE — Sepsis Progress Note (Signed)
Notified bedside nurse of need to administer antibiotics.  

## 2020-04-25 NOTE — ED Triage Notes (Signed)
[facility] from [facility]. 911 was called for abd pain. However, upon arrival CC changed to altered mental status.

## 2020-04-25 NOTE — ED Notes (Signed)
Patient transported to CT 

## 2020-04-25 NOTE — ED Notes (Signed)
Case mgmt contacted on behalf of this patient to discuss potential for neglect on the part of pt's SNF [facility].

## 2020-04-25 NOTE — ED Notes (Signed)
Attempted to call report. RN requested to be called back

## 2020-04-25 NOTE — Sepsis Progress Note (Signed)
Notified bedside nurse of need to draw repeat lactic acid. 

## 2020-04-26 ENCOUNTER — Other Ambulatory Visit: Payer: Self-pay

## 2020-04-26 DIAGNOSIS — R7881 Bacteremia: Secondary | ICD-10-CM | POA: Diagnosis not present

## 2020-04-26 DIAGNOSIS — B9620 Unspecified Escherichia coli [E. coli] as the cause of diseases classified elsewhere: Secondary | ICD-10-CM | POA: Diagnosis not present

## 2020-04-26 LAB — CBC
HCT: 29.8 % — ABNORMAL LOW (ref 39.0–52.0)
Hemoglobin: 9.4 g/dL — ABNORMAL LOW (ref 13.0–17.0)
MCH: 30.1 pg (ref 26.0–34.0)
MCHC: 31.5 g/dL (ref 30.0–36.0)
MCV: 95.5 fL (ref 80.0–100.0)
Platelets: 299 10*3/uL (ref 150–400)
RBC: 3.12 MIL/uL — ABNORMAL LOW (ref 4.22–5.81)
RDW: 15.7 % — ABNORMAL HIGH (ref 11.5–15.5)
WBC: 9.1 10*3/uL (ref 4.0–10.5)
nRBC: 0 % (ref 0.0–0.2)

## 2020-04-26 LAB — BLOOD CULTURE ID PANEL (REFLEXED) - BCID2

## 2020-04-26 LAB — COMPREHENSIVE METABOLIC PANEL
ALT: 19 U/L (ref 0–44)
AST: 24 U/L (ref 15–41)
Albumin: 3.1 g/dL — ABNORMAL LOW (ref 3.5–5.0)
Alkaline Phosphatase: 77 U/L (ref 38–126)
Anion gap: 16 — ABNORMAL HIGH (ref 5–15)
BUN: 81 mg/dL — ABNORMAL HIGH (ref 8–23)
CO2: 17 mmol/L — ABNORMAL LOW (ref 22–32)
Calcium: 10 mg/dL (ref 8.9–10.3)
Chloride: 121 mmol/L — ABNORMAL HIGH (ref 98–111)
Creatinine, Ser: 3.52 mg/dL — ABNORMAL HIGH (ref 0.61–1.24)
GFR, Estimated: 16 mL/min — ABNORMAL LOW (ref >60)
Glucose, Bld: 113 mg/dL — ABNORMAL HIGH (ref 70–99)
Potassium: 4.3 mmol/L (ref 3.5–5.1)
Sodium: 154 mmol/L — ABNORMAL HIGH (ref 135–145)
Total Bilirubin: 0.6 mg/dL (ref 0.3–1.2)
Total Protein: 6.6 g/dL (ref 6.5–8.1)

## 2020-04-26 LAB — ABO/RH: ABO/RH(D): O POS

## 2020-04-26 MED ORDER — AMLODIPINE BESYLATE 10 MG PO TABS
10.0000 mg | ORAL_TABLET | Freq: Every day | ORAL | Status: DC
  Administered 2020-04-26: 10 mg via ORAL
  Filled 2020-04-26 (×2): qty 1

## 2020-04-26 MED ORDER — DEXTROSE 5 % IV SOLN: INTRAVENOUS | Status: DC

## 2020-04-26 MED ORDER — ZOLPIDEM TARTRATE 5 MG PO TABS
5.0000 mg | ORAL_TABLET | Freq: Every day | ORAL | Status: DC
  Administered 2020-04-26: 5 mg via ORAL
  Filled 2020-04-26 (×2): qty 1

## 2020-04-26 MED ORDER — QUETIAPINE FUMARATE 25 MG PO TABS
12.5000 mg | ORAL_TABLET | Freq: Every day | ORAL | Status: DC
  Administered 2020-04-26: 12.5 mg via ORAL
  Filled 2020-04-26 (×2): qty 1

## 2020-04-26 MED ORDER — CHLORHEXIDINE GLUCONATE CLOTH 2 % EX PADS
6.0000 | MEDICATED_PAD | Freq: Every day | CUTANEOUS | Status: DC
  Administered 2020-04-26 – 2020-04-30 (×4): 6 via TOPICAL

## 2020-04-26 MED ORDER — LORAZEPAM 2 MG/ML IJ SOLN
0.5000 mg | Freq: Four times a day (QID) | INTRAMUSCULAR | Status: DC | PRN
  Administered 2020-04-26 – 2020-04-30 (×5): 0.5 mg via INTRAVENOUS
  Filled 2020-04-26 (×5): qty 1

## 2020-04-26 MED ORDER — SODIUM CHLORIDE 0.9 % IV SOLN
1.0000 g | Freq: Two times a day (BID) | INTRAVENOUS | Status: DC
  Administered 2020-04-26 – 2020-04-30 (×9): 1 g via INTRAVENOUS
  Filled 2020-04-26 (×10): qty 1

## 2020-04-26 MED ORDER — DEXTROSE 5 % IV SOLN
0.9400 g | INTRAVENOUS | Status: DC
  Administered 2020-04-26: 0.94 g via INTRAVENOUS
  Filled 2020-04-26: qty 4.51

## 2020-04-26 NOTE — Consult Note (Signed)
   [facility] CM Inpatient Consult   04/26/2020  Christian Warren. December 29, 1932 [id number]  Chart reviewed to assess for potential [facility] Care Management community service needs due to extreme unplanned readmission risk score. Per review, patient from SNF and plan is to return to SNF. No [facility] Care Management follow up needs.   Christophe Louis, MSN, RN [facility] Liaison Nurse Mobile Phone [phone]  Toll free office [phone]

## 2020-04-26 NOTE — Progress Notes (Signed)
PROGRESS NOTE    Christian Warren.  SNK:[id number] DOB: 1933-04-07 DOA: 04/25/2020 PCP: Retia Passe, NP    Brief Narrative:  Patient is a 86 year old male with history of dementia, hypertension, BPH, urinary obstruction the status post chronic indwelling Foley catheter and currently long-term nursing home resident brought to the ER with altered mental status for the last few days.  Reportedly, at baseline he is able to talk some and ambulates with a walker.  Over last few days he has been lethargic and complaining of abdominal pain.  In the emergency room, he was found to be tachycardic, confused.  He had elevated sodium level.  Acute kidney injury.  Urine consistent with UTI.  Blood cultures urine cultures drawn and admitted.   Assessment & Plan:   Principal Problem:   Bacteremia due to Escherichia coli Active Problems:   Essential hypertension   Severe dehydration   Urinary retention   Altered mental status   AKI (acute kidney injury) (HCC)   CKD (chronic kidney disease) stage 3, GFR 30-59 ml/min (HCC)   UTI (urinary tract infection)  Sepsis, present on admission due to E. coli bacteremia, ESBL E. coli UTI and bacteremia due to presence of chronic indwelling Foley catheter: Foley catheter exchanged. Blood cultures with ESBL E. Coli. Meropenem to continue until final cultures.  Acute kidney injury with underlying chronic kidney disease stage IIIa: Due to dehydration, sepsis.  Treated with IV fluids with some improvement.  Continue IV fluids.  Continue monitoring.  Severe dehydration/hyperchloremic hypernatremia: Treated with isotonic saline.  Sodium 154.  Will change to dextrose infusion.  Recheck levels tomorrow morning.  Hopefully, his mental status improves and he is able to drink water.  If he is not able to drink adequate water and unable to keep hydrated, may need alternate means of feeding or hospice.  Chronic urinary obstruction: On Foley catheter.  On Flomax and Proscar.   He is not ready for voiding trial with altered mentation.  Unlikely a surgical candidate until clinical improvement.  Hypertension: Blood pressure is stabilizing.  Resume amlodipine.  Acute metabolic encephalopathy in the setting of underlying dementia: Remains frail debilitated.  No adequate oral intake.  No focal deficit. Symptomatic treatment with pendulous and Seroquel at night.  All-time fall precautions.  All time delirium precautions. With his advanced dementia, he may benefit with evaluation by palliative or even hospice.  Will consult palliative medicine team to start conversation.   DVT prophylaxis: SCDs Start: 04/25/20 2110   Code Status: DNR Family Communication: Patient's niece on the phone.  She is his next of kin.  He does not have any healthcare power of attorney. Disposition Plan: Status is: Inpatient  Remains inpatient appropriate because:IV treatments appropriate due to intensity of illness or inability to take PO and Inpatient level of care appropriate due to severity of illness   Dispo: The patient is from: SNF              Anticipated d/c is to: SNF              Anticipated d/c date is: > 3 days              Patient currently is not medically stable to d/c.         Consultants:   Palliative  Procedures:   None  Antimicrobials:  Antibiotics Given (last 72 hours)    Date/Time Action Medication Dose Rate   04/25/20 1313 New Bag/Given   piperacillin-tazobactam (ZOSYN) IVPB 3.375  g 3.375 g 100 mL/hr   04/25/20 2237 New Bag/Given   cefTRIAXone (ROCEPHIN) 1 g in sodium chloride 0.9 % 100 mL IVPB 1 g 200 mL/hr   04/26/20 0439 New Bag/Given   ceftazidime-avibactam (AVYCAZ) 0.94 g in dextrose 5 % 50 mL IVPB 0.94 g 25 mL/hr   04/26/20 1305 New Bag/Given   meropenem (MERREM) 1 g in sodium chloride 0.9 % 100 mL IVPB 1 g 200 mL/hr         Subjective: Patient seen and examined.  Tremulous and restless.  Unable to keep up conversation.   Afebrile.  Objective: Vitals:   04/25/20 2105 04/26/20 0129 04/26/20 0606 04/26/20 1006  BP: (!) 141/66 (!) 150/88 (!) 146/89 (!) 142/94  Pulse: 100 (!) 103 (!) 110 94  Resp: 16 17 14 17   Temp: (!) 97.5 F (36.4 C) 97.9 F (36.6 C) 98.1 F (36.7 C) 97.6 F (36.4 C)  TempSrc: Oral  Oral Oral  SpO2: 95% 100% 100% 100%  Weight:  87 kg    Height:  6' (1.829 m)      Intake/Output Summary (Last 24 hours) at 04/26/2020 1414 Last data filed at 04/26/2020 0606 Gross per 24 hour  Intake 2034.07 ml  Output 2425 ml  Net -390.93 ml   Filed Weights   04/26/20 0129  Weight: 87 kg    Examination:  General exam: Appears anxious, tremulous, alert but not oriented. Chronically sick looking and dry. Respiratory system: Clear to auscultation. Respiratory effort normal.  Some conducted airway sounds. Cardiovascular system: S1 & S2 heard, tachycardic. Gastrointestinal system: Soft and nontender.  Winces on palpation suprapubic area.   Central nervous system: Drowsy, mumbles. Psychiatry: Judgement and insight appear compromised.      Data Reviewed: I have personally reviewed following labs and imaging studies  CBC: Recent Labs  Lab 04/25/20 1137 04/26/20 0346  WBC 11.6* 9.1  NEUTROABS 8.6*  --   HGB 10.6* 9.4*  HCT 33.7* 29.8*  MCV 96.3 95.5  PLT 346 299   Basic Metabolic Panel: Recent Labs  Lab 04/25/20 1137 04/26/20 0346  NA 150* 154*  K 3.7 4.3  CL 115* 121*  CO2 20* 17*  GLUCOSE 133* 113*  BUN 92* 81*  CREATININE 4.16* 3.52*  CALCIUM 10.5* 10.0   GFR: Estimated Creatinine Clearance: 17.1 mL/min (A) (by C-G formula based on SCr of 3.52 mg/dL (H)). Liver Function Tests: Recent Labs  Lab 04/25/20 1137 04/26/20 0346  AST 26 24  ALT 23 19  ALKPHOS 91 77  BILITOT 0.6 0.6  PROT 7.4 6.6  ALBUMIN 3.4* 3.1*   Recent Labs  Lab 04/25/20 1137  LIPASE 23   No results for input(s): AMMONIA in the last 168 hours. Coagulation Profile: Recent Labs  Lab  04/25/20 1137  INR 1.2   Cardiac Enzymes: No results for input(s): CKTOTAL, CKMB, CKMBINDEX, TROPONINI in the last 168 hours. BNP (last 3 results) No results for input(s): PROBNP in the last 8760 hours. HbA1C: No results for input(s): HGBA1C in the last 72 hours. CBG: No results for input(s): GLUCAP in the last 168 hours. Lipid Profile: No results for input(s): CHOL, HDL, LDLCALC, TRIG, CHOLHDL, LDLDIRECT in the last 72 hours. Thyroid Function Tests: No results for input(s): TSH, T4TOTAL, FREET4, T3FREE, THYROIDAB in the last 72 hours. Anemia Panel: No results for input(s): VITAMINB12, FOLATE, FERRITIN, TIBC, IRON, RETICCTPCT in the last 72 hours. Sepsis Labs: Recent Labs  Lab 04/25/20 1148 04/25/20 1443  LATICACIDVEN 2.1* 1.1  Recent Results (from the past 240 hour(s))  Urine culture     Status: Abnormal (Preliminary result)   Collection Time: 04/25/20 11:37 AM   Specimen: Urine, Clean Catch  Result Value Ref Range Status   Specimen Description   Final    URINE, CLEAN CATCH Performed at [facility], [address], [place], [place] [address]    Special Requests   Final    NONE Performed at [facility], [address], [place], [place] [address]    Culture (A)  Final    30,000 COLONIES/mL PSEUDOMONAS AERUGINOSA SUSCEPTIBILITIES TO FOLLOW CULTURE REINCUBATED FOR BETTER GROWTH Performed at [facility], [address], [place], [place] [address]    Report Status PENDING  Incomplete  Culture, blood (routine x 2)     Status: None (Preliminary result)   Collection Time: 04/25/20 11:37 AM   Specimen: BLOOD  Result Value Ref Range Status   Specimen Description   Final    BLOOD LEFT ANTECUBITAL Performed at [facility], [address], [place], [place] [address]    Special Requests   Final    BOTTLES DRAWN AEROBIC AND ANAEROBIC Blood Culture adequate volume Performed at [facility],  [address] [place], [place] [address]    Culture   Final    NO GROWTH 1 DAY Performed at [facility], [address], [place], [place] [address]    Report Status PENDING  Incomplete  Culture, blood (routine x 2)     Status: None (Preliminary result)   Collection Time: 04/25/20 11:37 AM   Specimen: BLOOD  Result Value Ref Range Status   Specimen Description   Final    BLOOD BLOOD LEFT FOREARM Performed at [facility], [address], [place], [place] [address]    Special Requests   Final    BOTTLES DRAWN AEROBIC AND ANAEROBIC Blood Culture results may not be optimal due to an inadequate volume of blood received in culture bottles Performed at [facility], [address], [place], [place] [address]    Culture  Setup Time   Final    IN BOTH AEROBIC AND ANAEROBIC BOTTLES GRAM NEGATIVE RODS Organism ID to follow CRITICAL RESULT CALLED TO, READ BACK BY AND VERIFIED WITH: Erling Cruz [facility] 04/26/20 0251 JDW    Culture   Final    NO GROWTH 1 DAY Performed at [facility], [address] [place], [place] [address]    Report Status PENDING  Incomplete  Blood Culture ID Panel (Reflexed)     Status: Abnormal   Collection Time: 04/25/20 11:37 AM  Result Value Ref Range Status   Enterococcus faecalis NOT DETECTED NOT DETECTED Final   Enterococcus Faecium NOT DETECTED NOT DETECTED Final   Listeria monocytogenes NOT DETECTED NOT DETECTED Final   Staphylococcus species NOT DETECTED NOT DETECTED Final   Staphylococcus aureus (BCID) NOT DETECTED NOT DETECTED Final   Staphylococcus epidermidis NOT DETECTED NOT DETECTED Final   Staphylococcus lugdunensis NOT DETECTED NOT DETECTED Final   Streptococcus species NOT DETECTED NOT DETECTED Final   Streptococcus agalactiae NOT DETECTED NOT DETECTED Final   Streptococcus pneumoniae NOT DETECTED NOT DETECTED Final   Streptococcus pyogenes NOT DETECTED NOT DETECTED Final    A.calcoaceticus-baumannii NOT DETECTED NOT DETECTED Final   Bacteroides fragilis NOT DETECTED NOT DETECTED Final   Enterobacterales DETECTED (A) NOT DETECTED Final    Comment: Enterobacterales represent a large order of  gram negative bacteria, not a single organism. CRITICAL RESULT CALLED TO, READ BACK BY AND VERIFIED WITH: E JACKSON PHARMD 04/26/20 0251 JDW    Enterobacter cloacae complex NOT DETECTED NOT DETECTED Final   Escherichia coli DETECTED (A) NOT DETECTED Final    Comment: CRITICAL RESULT CALLED TO, READ BACK BY AND VERIFIED WITH: E JACKSON PHARMD 04/26/20 0251 JDW    Klebsiella aerogenes NOT DETECTED NOT DETECTED Final   Klebsiella oxytoca NOT DETECTED NOT DETECTED Final   Klebsiella pneumoniae NOT DETECTED NOT DETECTED Final   Proteus species NOT DETECTED NOT DETECTED Final   Salmonella species NOT DETECTED NOT DETECTED Final   Serratia marcescens NOT DETECTED NOT DETECTED Final   Haemophilus influenzae NOT DETECTED NOT DETECTED Final   Neisseria meningitidis NOT DETECTED NOT DETECTED Final   Pseudomonas aeruginosa NOT DETECTED NOT DETECTED Final   Stenotrophomonas maltophilia NOT DETECTED NOT DETECTED Final   Candida albicans NOT DETECTED NOT DETECTED Final   Candida auris NOT DETECTED NOT DETECTED Final   Candida glabrata NOT DETECTED NOT DETECTED Final   Candida krusei NOT DETECTED NOT DETECTED Final   Candida parapsilosis NOT DETECTED NOT DETECTED Final   Candida tropicalis NOT DETECTED NOT DETECTED Final   Cryptococcus neoformans/gattii NOT DETECTED NOT DETECTED Final   CTX-M ESBL DETECTED (A) NOT DETECTED Final    Comment: CRITICAL RESULT CALLED TO, READ BACK BY AND VERIFIED WITH: E JACKSON PHARMD 04/26/20 0251 JDW (NOTE) Extended spectrum beta-lactamase detected. Recommend a carbapenem as initial therapy.      Carbapenem resistance IMP NOT DETECTED NOT DETECTED Final   Carbapenem resistance KPC NOT DETECTED NOT DETECTED Final   Carbapenem resistance NDM NOT  DETECTED NOT DETECTED Final   Carbapenem resist OXA 48 LIKE NOT DETECTED NOT DETECTED Final   Carbapenem resistance VIM NOT DETECTED NOT DETECTED Final    Comment: Performed at [facility], [address], [place], [place] [address]  Respiratory Panel by RT PCR (Flu A&B, Covid) - Urine, Catheterized     Status: None   Collection Time: 04/25/20 12:14 PM   Specimen: Urine, Catheterized; Nasopharyngeal  Result Value Ref Range Status   SARS Coronavirus 2 by RT PCR NEGATIVE NEGATIVE Final    Comment: (NOTE) SARS-CoV-2 target nucleic acids are NOT DETECTED.  The SARS-CoV-2 RNA is generally detectable in upper respiratoy specimens during the acute phase of infection. The lowest concentration of SARS-CoV-2 viral copies this assay can detect is 131 copies/mL. A negative result does not preclude SARS-Cov-2 infection and should not be used as the sole basis for treatment or other patient management decisions. A negative result may occur with  improper specimen collection/handling, submission of specimen other than nasopharyngeal swab, presence of viral mutation(s) within the areas targeted by this assay, and inadequate number of viral copies (<131 copies/mL). A negative result must be combined with clinical observations, patient history, and epidemiological information. The expected result is Negative.  Fact Sheet for Patients:  https://www.moore.com/  Fact Sheet for Healthcare Providers:  https://www.young.biz/  This test is no t yet approved or cleared by the [place] FDA and  has been authorized for detection and/or diagnosis of SARS-CoV-2 by FDA under an Emergency Use Authorization (EUA). This EUA will remain  in effect (meaning this test can be used) for the duration of the COVID-19 declaration under Section 564(b)(1) of the Act, 21 U.S.C. section 360bbb-3(b)(1), unless the authorization is terminated or revoked sooner.     Influenza  A by PCR NEGATIVE NEGATIVE Final  Influenza B by PCR NEGATIVE NEGATIVE Final    Comment: (NOTE) The Xpert Xpress SARS-CoV-2/FLU/RSV assay is intended as an aid in  the diagnosis of influenza from Nasopharyngeal swab specimens and  should not be used as a sole basis for treatment. Nasal washings and  aspirates are unacceptable for Xpert Xpress SARS-CoV-2/FLU/RSV  testing.  Fact Sheet for Patients: https://www.moore.com/  Fact Sheet for Healthcare Providers: https://www.young.biz/  This test is not yet approved or cleared by the [place] FDA and  has been authorized for detection and/or diagnosis of SARS-CoV-2 by  FDA under an Emergency Use Authorization (EUA). This EUA will remain  in effect (meaning this test can be used) for the duration of the  Covid-19 declaration under Section 564(b)(1) of the Act, 21  U.S.C. section 360bbb-3(b)(1), unless the authorization is  terminated or revoked. Performed at [facility], [address], [place], [place] [address]          Radiology Studies: DG Abdomen 1 View  Result Date: 04/25/2020 CLINICAL DATA:  Abdominal pain EXAM: ABDOMEN - 1 VIEW COMPARISON:  CT 01/27/2020 FINDINGS: Nonobstructive bowel gas pattern. No organomegaly or free air. Rounded calcified structures in the pelvis measuring up to 1.7 cm. These correspond to the bladder stones seen on prior CT. Vascular calcifications. Diffuse degenerative changes in the lumbar spine. No acute bony abnormality. IMPRESSION: Bladder stones again noted as seen on prior CT. No bowel obstruction. No acute findings. Electronically Signed   By: Charlett Nose M.D.   On: 04/25/2020 18:37   CT Head Wo Contrast  Result Date: 04/25/2020 CLINICAL DATA:  Mental status change. Nonverbal. Lethargy. Renal insufficiency. EXAM: CT HEAD WITHOUT CONTRAST TECHNIQUE: Contiguous axial images were obtained from the base of the skull through the vertex  without intravenous contrast. COMPARISON:  03/03/2020 FINDINGS: Brain: Advanced cerebral/cerebellar atrophy. Marked low density in the periventricular white matter likely related to small vessel disease. No mass lesion, hemorrhage, hydrocephalus, acute infarct, intra-axial, or extra-axial fluid collection. Vascular: Intracranial atherosclerosis. Skull: Motion degradation.  No significant soft tissue swelling. Sinuses/Orbits: Normal imaged portions of the orbits and globes. Clear paranasal sinuses and mastoid air cells. Other: None. IMPRESSION: 1. Motion degradation. 2. No acute intracranial abnormality. 3. Cerebral atrophy and small vessel ischemic change. Electronically Signed   By: Jeronimo Greaves M.D.   On: 04/25/2020 14:30   US RENAL  Result Date: 04/25/2020 CLINICAL DATA:  Acute kidney injury EXAM: RENAL / URINARY TRACT ULTRASOUND COMPLETE COMPARISON:  None. FINDINGS: Right Kidney: Renal measurements: 9.4 x 4.3 x 4.6 cm = volume: 96.8 mL. Echogenicity within normal limits. Mild pelvicaliectasis is seen. No shadowing stones are noted. Left Kidney: Renal measurements: 11.1 x 5.8 x 5.7 cm = volume: 192 mL. Echogenicity within normal limits. Mild pelvicaliectasis is seen. No shadowing stones are noted. Bladder: Foley catheter is noted. Other: None. IMPRESSION: Mild bilateral hydronephrosis.  No shadowing stones. Electronically Signed   By: Jonna Clark M.D.   On: 04/25/2020 20:47   DG Chest Port 1 View  Result Date: 04/25/2020 CLINICAL DATA:  Weakness. EXAM: PORTABLE CHEST 1 VIEW COMPARISON:  Chest radiograph 03/03/2020 and earlier. Chest CT 02/04/2020. FINDINGS: Shallow inspiration radiograph. Heart size at the upper limits of normal, unchanged. Aortic atherosclerosis. No appreciable airspace consolidation or pulmonary edema. No evidence of pleural effusion or pneumothorax. No acute bony abnormality identified. IMPRESSION: Shallow inspiration radiograph. No evidence of acute cardiopulmonary abnormality.  Aortic Atherosclerosis (ICD10-I70.0). Electronically Signed   By: Jackey Loge DO  On: 04/25/2020 10:49        Scheduled Meds: . amLODipine  10 mg Oral Daily  . aspirin EC  81 mg Oral Daily  . Chlorhexidine Gluconate Cloth  6 each Topical Daily  . finasteride  5 mg Oral Daily  . QUEtiapine  12.5 mg Oral QHS  . tamsulosin  0.4 mg Oral BID  . zolpidem  5 mg Oral QHS   Continuous Infusions: . dextrose 100 mL/hr at 04/26/20 0649  . meropenem (MERREM) IV 1 g (04/26/20 1305)     LOS: 1 day    Time spent: 43    Dorcas Carrow, MD [facility] Pager [phone]

## 2020-04-26 NOTE — Progress Notes (Signed)
Addendum:  E Coli was an ESBL, not carbapenem resistant. Will switch Avycaz to Meropenem. Discussed with Dr. Jerral Ralph.   Sharin Mons, PharmD, BCPS, BCIDP Infectious Diseases Clinical Pharmacist Phone: [phone] 04/26/2020 8:28 AM     PHARMACY - PHYSICIAN COMMUNICATION CRITICAL VALUE ALERT - BLOOD CULTURE IDENTIFICATION (BCID)  Christian Suess. is an 86 y.o. male who presented to [facility] on 04/25/2020 with a chief complaint of medical history significant of dementia, HTN, BPH, chronic indwelling foley.  Name of physician (or Provider) Contacted: M. Katherina Right  Current antibiotics: CTX  Changes to prescribed antibiotics recommended:  Avycaz 0.94g q24h  Results for orders placed or performed during the hospital encounter of 04/25/20  Blood Culture ID Panel (Reflexed) (Collected: 04/25/2020 11:37 AM)  Result Value Ref Range   Enterococcus faecalis NOT DETECTED NOT DETECTED   Enterococcus Faecium NOT DETECTED NOT DETECTED   Listeria monocytogenes NOT DETECTED NOT DETECTED   Staphylococcus species NOT DETECTED NOT DETECTED   Staphylococcus aureus (BCID) NOT DETECTED NOT DETECTED   Staphylococcus epidermidis NOT DETECTED NOT DETECTED   Staphylococcus lugdunensis NOT DETECTED NOT DETECTED   Streptococcus species NOT DETECTED NOT DETECTED   Streptococcus agalactiae NOT DETECTED NOT DETECTED   Streptococcus pneumoniae NOT DETECTED NOT DETECTED   Streptococcus pyogenes NOT DETECTED NOT DETECTED   A.calcoaceticus-baumannii NOT DETECTED NOT DETECTED   Bacteroides fragilis NOT DETECTED NOT DETECTED   Enterobacterales DETECTED (A) NOT DETECTED   Enterobacter cloacae complex NOT DETECTED NOT DETECTED   Escherichia coli DETECTED (A) NOT DETECTED   Klebsiella aerogenes NOT DETECTED NOT DETECTED   Klebsiella oxytoca NOT DETECTED NOT DETECTED   Klebsiella pneumoniae NOT DETECTED NOT DETECTED   Proteus species NOT DETECTED NOT DETECTED   Salmonella species NOT DETECTED NOT DETECTED    Serratia marcescens NOT DETECTED NOT DETECTED   Haemophilus influenzae NOT DETECTED NOT DETECTED   Neisseria meningitidis NOT DETECTED NOT DETECTED   Pseudomonas aeruginosa NOT DETECTED NOT DETECTED   Stenotrophomonas maltophilia NOT DETECTED NOT DETECTED   Candida albicans NOT DETECTED NOT DETECTED   Candida auris NOT DETECTED NOT DETECTED   Candida glabrata NOT DETECTED NOT DETECTED   Candida krusei NOT DETECTED NOT DETECTED   Candida parapsilosis NOT DETECTED NOT DETECTED   Candida tropicalis NOT DETECTED NOT DETECTED   Cryptococcus neoformans/gattii NOT DETECTED NOT DETECTED   CTX-M ESBL DETECTED (A) NOT DETECTED   Carbapenem resistance IMP NOT DETECTED NOT DETECTED   Carbapenem resistance KPC NOT DETECTED NOT DETECTED   Carbapenem resistance NDM NOT DETECTED NOT DETECTED   Carbapenem resist OXA 48 LIKE NOT DETECTED NOT DETECTED   Carbapenem resistance VIM NOT DETECTED NOT DETECTED    Arley Phenix RPh 04/26/2020, 8:27 AM

## 2020-04-26 NOTE — Evaluation (Signed)
Clinical/Bedside Swallow Evaluation Patient Details  Name: Christian Warren. MRN: [id number] Date of Birth: 1932/07/09  Today's Date: 04/26/2020 Time: SLP Start Time (ACUTE ONLY): 1440 SLP Stop Time (ACUTE ONLY): 1515 SLP Time Calculation (min) (ACUTE ONLY): 35 min  Past Medical History:  Past Medical History:  Diagnosis Date  . Arthritis   . Hypertension    Past Surgical History: History reviewed. No pertinent surgical history. HPI:  Per admission note, "Christian Warren. is a 86 y.o. male with medical history significant of dementia, HTN, BPH, chronic indwelling foley. Pt is currently altered. Hx is from EDP report and chart review. Patient has been more altered at his facility over the last few days. He is normally able to talk, ambulate with walker, and feed himself. Over the last few days, he has become increasing lethargic with intermittent complaints of abdominal pain. EMS was called today and he was found to be tachycardic and confused. He was transported to [facility].  ED Course: ED w/u revealed hypernatremia, AKI, and a possible UTI. He was given fluids and started on zosyn. His chronic indwelling foley was changed." CT head negative, CXR negative.   Assessment / Plan / Recommendation Clinical Impression  Patient currently presents with clinical indications of potentially severe oropharyngeal dysphagia likely due to his dementia.  He did not follow directions and is severely dysarthric but does demonstrate a strong voice.  Decreased attention to task noted with pt appearing to oral hold with solids more than liquids - requiring puree to aid oral transiting.    Clinical indications of aspiration noted with thin and nectar thick liquids c/b strong coughing post--swallow - which did not fully resolve.  Although pt did not cough with all presentations, he chronically cleared his throat and coughed after suspected aspiration event and appeared uncomfortable.  Concern for pt overtly aspirating without  clearance.   Concern present for pharyngeal retention and aspiration as a result.     Due to pt's dementia, level of dysphagia and full treatement status, recommend NPO x tsps of water and applesauce with crushed medications. Defer to MD for indication of further testing or potential transiting into comfort po given recurrent hospital admits, dementia and current level of dysphagia.  Do not anticipate MBS would change pt's outcomes however defer to MD.  Rn informed of recommendations. SLP Visit Diagnosis: Dysphagia, oropharyngeal phase (R13.12);Dysphagia, unspecified (R13.10)    Aspiration Risk  Severe aspiration risk;Risk for inadequate nutrition/hydration    Diet Recommendation NPO (tsps of water and medicine with applesauce *crushed*)   Liquid Administration via: Spoon Medication Administration: Crushed with puree    Other  Recommendations Oral Care Recommendations: Oral care QID Other Recommendations: Have oral suction available   Follow up Recommendations None      Frequency and Duration min 1 x/week  1 week       Prognosis Prognosis for Safe Diet Advancement: Fair Barriers to Reach Goals: Severity of deficits      Swallow Study   General HPI: Per admission note, "Christian Warren. is a 86 y.o. male with medical history significant of dementia, HTN, BPH, chronic indwelling foley. Pt is currently altered. Hx is from EDP report and chart review. Patient has been more altered at his facility over the last few days. He is normally able to talk, ambulate with walker, and feed himself. Over the last few days, he has become increasing lethargic with intermittent complaints of abdominal pain. EMS was called today and he was found to be tachycardic  and confused. He was transported to [facility]  ED Course: ED w/u revealed hypernatremia, AKI, and a possible UTI. He was given fluids and started on zosyn. His chronic indwelling foley was changed." CT head negative, CXR negative. Type of Study:  Bedside Swallow Evaluation Diet Prior to this Study: Regular;Thin liquids Temperature Spikes Noted: No Respiratory Status: Room air History of Recent Intubation: No Behavior/Cognition: Alert;Uncooperative;Distractible;Doesn't follow directions Oral Cavity Assessment: Other (comment) (pt did not open mouth adequately to view and did not follow directions) Vision: Impaired for self-feeding Self-Feeding Abilities:  (able to hold own cup and bring spoon to mouth with assist) Patient Positioning: Upright in bed Baseline Vocal Quality: Normal Volitional Cough: Other (Comment);Cognitively unable to elicit Volitional Swallow: Unable to elicit    Oral/Motor/Sensory Function Overall Oral Motor/Sensory Function: Generalized oral weakness (pt did not follow directions, able to seal lips on straw and spoon, difficulty feeding self - bringing cracker to mouth, etc)   Ice Chips Ice chips: Not tested   Thin Liquid Thin Liquid: Impaired Presentation: Cup;Self Fed;Spoon;Straw Oral Phase Impairments: Reduced lingual movement/coordination Oral Phase Functional Implications: Prolonged oral transit Pharyngeal  Phase Impairments: Cough - Immediate;Cough - Delayed;Suspected delayed Swallow    Nectar Thick Nectar Thick Liquid: Impaired Presentation: Self Fed;Cup;Straw;Spoon Oral Phase Impairments: Reduced lingual movement/coordination Oral phase functional implications: Prolonged oral transit Pharyngeal Phase Impairments: Suspected delayed Swallow;Cough - Delayed   Honey Thick Honey Thick Liquid: Not tested   Puree Puree: Impaired Presentation: Self Fed;Spoon Oral Phase Impairments: Reduced lingual movement/coordination Oral Phase Functional Implications: Prolonged oral transit;Oral holding;Oral residue Pharyngeal Phase Impairments: Suspected delayed Swallow   Solid     Solid: Impaired Oral Phase Impairments: Impaired mastication;Poor awareness of bolus Oral Phase Functional Implications: Impaired  mastication;Oral residue;Oral holding Pharyngeal Phase Impairments: Suspected delayed Swallow      Christian Warren 04/26/2020,3:30 PM   Christian Infante, MS [facility] SLP Acute Rehab Services Office [phone] Pager [phone]

## 2020-04-26 NOTE — Progress Notes (Signed)
PHARMACY - PHYSICIAN COMMUNICATION CRITICAL VALUE ALERT - BLOOD CULTURE IDENTIFICATION (BCID)  Christian Warren. is an 86 y.o. male who presented to [facility] on 04/25/2020 with a chief complaint of medical history significant of dementia, HTN, BPH, chronic indwelling foley.  Name of physician (or Provider) Contacted: M. Katherina Right  Current antibiotics: CTX  Changes to prescribed antibiotics recommended:  Avycaz 0.94g q24h  Results for orders placed or performed during the hospital encounter of 04/25/20  Blood Culture ID Panel (Reflexed) (Collected: 04/25/2020 11:37 AM)  Result Value Ref Range   Enterococcus faecalis NOT DETECTED NOT DETECTED   Enterococcus Faecium NOT DETECTED NOT DETECTED   Listeria monocytogenes NOT DETECTED NOT DETECTED   Staphylococcus species NOT DETECTED NOT DETECTED   Staphylococcus aureus (BCID) NOT DETECTED NOT DETECTED   Staphylococcus epidermidis NOT DETECTED NOT DETECTED   Staphylococcus lugdunensis NOT DETECTED NOT DETECTED   Streptococcus species NOT DETECTED NOT DETECTED   Streptococcus agalactiae NOT DETECTED NOT DETECTED   Streptococcus pneumoniae NOT DETECTED NOT DETECTED   Streptococcus pyogenes NOT DETECTED NOT DETECTED   A.calcoaceticus-baumannii NOT DETECTED NOT DETECTED   Bacteroides fragilis NOT DETECTED NOT DETECTED   Enterobacterales DETECTED (A) NOT DETECTED   Enterobacter cloacae complex NOT DETECTED NOT DETECTED   Escherichia coli DETECTED (A) NOT DETECTED   Klebsiella aerogenes NOT DETECTED NOT DETECTED   Klebsiella oxytoca NOT DETECTED NOT DETECTED   Klebsiella pneumoniae NOT DETECTED NOT DETECTED   Proteus species NOT DETECTED NOT DETECTED   Salmonella species NOT DETECTED NOT DETECTED   Serratia marcescens NOT DETECTED NOT DETECTED   Haemophilus influenzae NOT DETECTED NOT DETECTED   Neisseria meningitidis NOT DETECTED NOT DETECTED   Pseudomonas aeruginosa NOT DETECTED NOT DETECTED   Stenotrophomonas maltophilia NOT DETECTED NOT  DETECTED   Candida albicans NOT DETECTED NOT DETECTED   Candida auris NOT DETECTED NOT DETECTED   Candida glabrata NOT DETECTED NOT DETECTED   Candida krusei NOT DETECTED NOT DETECTED   Candida parapsilosis NOT DETECTED NOT DETECTED   Candida tropicalis NOT DETECTED NOT DETECTED   Cryptococcus neoformans/gattii NOT DETECTED NOT DETECTED   CTX-M ESBL DETECTED (A) NOT DETECTED   Carbapenem resistance IMP NOT DETECTED NOT DETECTED   Carbapenem resistance KPC NOT DETECTED NOT DETECTED   Carbapenem resistance NDM NOT DETECTED NOT DETECTED   Carbapenem resist OXA 48 LIKE NOT DETECTED NOT DETECTED   Carbapenem resistance VIM NOT DETECTED NOT DETECTED    Christian Warren RPh 04/26/2020, 3:51 AM

## 2020-04-27 DIAGNOSIS — N179 Acute kidney failure, unspecified: Secondary | ICD-10-CM | POA: Diagnosis not present

## 2020-04-27 DIAGNOSIS — E870 Hyperosmolality and hypernatremia: Secondary | ICD-10-CM

## 2020-04-27 DIAGNOSIS — R7881 Bacteremia: Secondary | ICD-10-CM | POA: Diagnosis not present

## 2020-04-27 DIAGNOSIS — R400 Somnolence: Secondary | ICD-10-CM | POA: Diagnosis not present

## 2020-04-27 LAB — CBC WITH DIFFERENTIAL/PLATELET
Abs Immature Granulocytes: 0 10*3/uL (ref 0.00–0.07)
Band Neutrophils: 3 %
Basophils Absolute: 0 10*3/uL (ref 0.0–0.1)
Basophils Relative: 0 %
Eosinophils Absolute: 0.3 10*3/uL (ref 0.0–0.5)
Eosinophils Relative: 4 %
HCT: 31.5 % — ABNORMAL LOW (ref 39.0–52.0)
Hemoglobin: 9.4 g/dL — ABNORMAL LOW (ref 13.0–17.0)
Lymphocytes Relative: 21 %
Lymphs Abs: 1.6 10*3/uL (ref 0.7–4.0)
MCH: 29.4 pg (ref 26.0–34.0)
MCHC: 29.8 g/dL — ABNORMAL LOW (ref 30.0–36.0)
MCV: 98.4 fL (ref 80.0–100.0)
Monocytes Absolute: 0.9 10*3/uL (ref 0.1–1.0)
Monocytes Relative: 12 %
Neutro Abs: 4.7 10*3/uL (ref 1.7–7.7)
Neutrophils Relative %: 60 %
Platelets: 312 10*3/uL (ref 150–400)
RBC: 3.2 MIL/uL — ABNORMAL LOW (ref 4.22–5.81)
RDW: 15.6 % — ABNORMAL HIGH (ref 11.5–15.5)
WBC: 7.5 10*3/uL (ref 4.0–10.5)
nRBC: 0 % (ref 0.0–0.2)

## 2020-04-27 LAB — COMPREHENSIVE METABOLIC PANEL WITH GFR
ALT: 20 U/L (ref 0–44)
AST: 29 U/L (ref 15–41)
Albumin: 3 g/dL — ABNORMAL LOW (ref 3.5–5.0)
Alkaline Phosphatase: 77 U/L (ref 38–126)
Anion gap: 9 (ref 5–15)
BUN: 64 mg/dL — ABNORMAL HIGH (ref 8–23)
CO2: 19 mmol/L — ABNORMAL LOW (ref 22–32)
Calcium: 9.9 mg/dL (ref 8.9–10.3)
Chloride: 119 mmol/L — ABNORMAL HIGH (ref 98–111)
Creatinine, Ser: 2.81 mg/dL — ABNORMAL HIGH (ref 0.61–1.24)
GFR, Estimated: 21 mL/min — ABNORMAL LOW
Glucose, Bld: 119 mg/dL — ABNORMAL HIGH (ref 70–99)
Potassium: 3.4 mmol/L — ABNORMAL LOW (ref 3.5–5.1)
Sodium: 147 mmol/L — ABNORMAL HIGH (ref 135–145)
Total Bilirubin: 0.5 mg/dL (ref 0.3–1.2)
Total Protein: 6.4 g/dL — ABNORMAL LOW (ref 6.5–8.1)

## 2020-04-27 LAB — URINE CULTURE: Culture: 30000 — AB

## 2020-04-27 LAB — PHOSPHORUS: Phosphorus: 2.8 mg/dL (ref 2.5–4.6)

## 2020-04-27 LAB — MAGNESIUM: Magnesium: 2.2 mg/dL (ref 1.7–2.4)

## 2020-04-27 MED ORDER — HYDRALAZINE HCL 20 MG/ML IJ SOLN: 10.0000 mg | Freq: Four times a day (QID) | INTRAMUSCULAR | Status: DC | PRN

## 2020-04-27 NOTE — TOC Initial Note (Addendum)
Transition of Care (TOC) - Initial/Assessment Note    Patient Details  Name: Christian Warren. MRN: [id number] Date of Birth: 11/18/1932  Transition of Care [facility]) CM/SW Contact:    Clearance Coots, LCSW Phone Number: 04/27/2020, 3:15 PM  Clinical Narrative:         BP:[id number] about Care at SNF (APS report).    CSW discussed with ED RNCM/social worker about the patient condition upon arrival to ED. ED triage nurse stated his condition warranted an APS report.     CSW reached out to the patient niece [place]. She reports the patient is a long term care resident at [facility] and will return at discharge. PT niece express concerns about patient care at the facility. She reports the physician informed her the patient was severely dehydrated upon arrival. Niece reports she took the patient to the urologist sometime ago and the staff express concerns about the patient foley catheter care as well. She spoke with the administration at the facility about the patient hygiene care however she feels nothing changed.  APS report made to department of social services.   Palliative Consult to follow.     Expected Discharge Plan: Skilled Nursing Facility Barriers to Discharge: Continued Medical Work up   Patient Goals and CMS Choice        Expected Discharge Plan and Services Expected Discharge Plan: Skilled Nursing Facility     Post Acute Care Choice: Skilled Nursing Facility Living arrangements for the past 2 months: Skilled Nursing Facility                                      Prior Living Arrangements/Services Living arrangements for the past 2 months: Skilled Nursing Facility Lives with:: Facility Resident          Need for Family Participation in Patient Care: Yes (Comment) Care giver support system in place?: Yes (comment) Current home services: DME    Activities of Daily Living Home Assistive Devices/Equipment: Blood pressure cuff, Grab bars around toilet, Grab  bars in shower, Hand-held shower hose, Hospital bed, [employer], Scales, Environmental consultant (specify type), Wheelchair ADL Screening (condition at time of admission) Patient's cognitive ability adequate to safely complete daily activities?: No Is the patient deaf or have difficulty hearing?: Yes (very HOH) Does the patient have difficulty seeing, even when wearing glasses/contacts?: No Does the patient have difficulty concentrating, remembering, or making decisions?: Yes Patient able to express need for assistance with ADLs?: No Does the patient have difficulty dressing or bathing?: Yes Independently performs ADLs?: No Communication: Independent Dressing (OT): Dependent Is this a change from baseline?: Pre-admission baseline Grooming: Dependent Is this a change from baseline?: Pre-admission baseline Feeding: Needs assistance Is this a change from baseline?: Pre-admission baseline Bathing: Dependent Is this a change from baseline?: Pre-admission baseline Toileting: Dependent Is this a change from baseline?: Pre-admission baseline In/Out Bed: Dependent Is this a change from baseline?: Pre-admission baseline Walks in Home: Dependent Is this a change from baseline?: Pre-admission baseline Does the patient have difficulty walking or climbing stairs?: Yes (secondary to weakness) Weakness of Legs: Both Weakness of Arms/Hands: Both  Permission Sought/Granted                  Emotional Assessment Appearance:: Appears stated age     Orientation: : Oriented to Self, Oriented to Place, Oriented to  Time, Oriented to Situation Alcohol / Substance Use: Not Applicable  Psych Involvement: No (comment)  Admission diagnosis:  Acute hypernatremia [E87.0] UTI (urinary tract infection) [N39.0] AKI (acute kidney injury) (HCC) [N17.9] Altered mental status, unspecified altered mental status type [R41.82] Sepsis with acute renal failure without septic shock, due to unspecified organism, unspecified acute  renal failure type (HCC) [A41.9, R65.20, N17.9] Patient Active Problem List   Diagnosis Date Noted  . Bacteremia due to Escherichia coli 04/26/2020  . UTI (urinary tract infection) 04/25/2020  . AKI (acute kidney injury) (HCC) 03/11/2020  . CKD (chronic kidney disease) stage 3, GFR 30-59 ml/min (HCC) 03/11/2020  . HOH (hard of hearing) 03/11/2020  . Mild dementia (HCC) 03/11/2020  . Bladder outflow obstruction 03/11/2020  . Palliative care by specialist   . Goals of care, counseling/discussion   . DNR (do not resuscitate)   . Sepsis (HCC) 03/03/2020  . Urinary retention   . Altered mental status   . Severe dehydration 02/04/2020  . Arthritis 12/30/2018  . Hyperlipidemia 12/30/2018  . Hypertension 12/30/2018  . Essential hypertension 12/30/2018  . Syncope and collapse 12/30/2018  . BPH (benign prostatic hyperplasia) 04/10/2017  . Chronic right shoulder pain 04/24/2015  . Memory impairment 04/24/2015  . Tremors of nervous system 04/24/2015  . Seizures (HCC) 12/21/2014   PCP:  Retia Passe, NP Pharmacy:   [facility] [id number] - [place], [place] - [address] AT [facility] [address]  [place] [address] Phone: [phone] Fax: [phone]     Social Determinants of Health (SDOH) Interventions    Readmission Risk Interventions No flowsheet data found.

## 2020-04-27 NOTE — Progress Notes (Signed)
Pt refusing PO intake. I offered applesauce and sips of water to which he declined both adamantly. I offered multiple times and repositioned the pt to see if that would make him more comfortable and willing to eat. He continues to refuse. MD made aware and she advised that meds be changed to IV. Palliative care has already been consulted and we will await goals of care meeting. Pt is resting comfortably in bed in no apparent distress. Bed in lowest and locked position with bed alarm on and floor mats in place.

## 2020-04-27 NOTE — Care Management Important Message (Signed)
Important Message  Patient Details IM Letter given to the Patient. Name: Christian Warren. MRN: [id number] Date of Birth: Nov 26, 1932   Medicare Important Message Given:  Yes     Caren Macadam 04/27/2020, 11:45 AM

## 2020-04-27 NOTE — Progress Notes (Signed)
[facility]                                                                              Patient Demographics  Christian Warren, is a 86 y.o. male, DOB - [birth year], ZOX:[id number]  Admit date - 04/25/2020   Admitting Physician Christian Spikeyrone A Kyle, DO  Outpatient Primary MD for the patient is Christian Passeose, Melissa S, NP  Outpatient specialists:   LOS - 2  days   Medical records reviewed and are as summarized below:    Chief Complaint  Patient presents with  . Abdominal Pain  . Altered Mental Status       Brief summary  Patient is a 86 year old male with history of dementia, hypertension, BPH, urinary obstruction the status post chronic indwelling Foley catheter and currently long-term nursing home resident brought to the ER with altered mental status for the last few days.  Reportedly, at baseline he is able to talk some and ambulates with a walker.  Over last few days he has been lethargic and complaining of abdominal pain.  In the emergency room, he was found to be tachycardic, confused.  He had elevated sodium level.  Acute kidney injury.  Urine consistent with UTI.  Blood cultures urine cultures drawn and admitted.   Assessment & Plan    Principal Problem:   Bacteremia due to Escherichia coli,  sepsis present on admission, E. coli UTI in the setting of chronic indwelling Foley catheter -Foley catheter was exchanged, blood cultures positive for ESBL E. coli, continue meropenem   Active Problems: Dementia with acute metabolic encephalopathy -Likely worsened due to #1, remains frail and debilitated, no adequate oral intake, high risk for aspiration per swallow study -Palliative medicine consulted for goals of care, may qualify for residential hospice, will follow recommendations -Patient somnolent at the time of my examination, not following commands, will DC Xanax.  Continue low-dose IV Ativan as needed.  QTC prolonged, hence no Haldol  Essential hypertension Currently  n.p.o., placed on IV hydralazine as needed for 5 parameters  Acute kidney injury superimposed on underlying CKD stage IIIa -Worsened due to dehydration, sepsis, continue IV fluids, currently n.p.o.  Severe dehydration/hyperchloremic hypernatremia Sodium 154 at the time of admission, now improving, continue D5 N.p.o. due to high risk aspiration, palliative medicine consulted for goals of care, inadequate p.o. intake, advanced dementia   Chronic urinary obstruction with indwelling Foley catheter On Flomax and Proscar, Foley catheter exchanged   Code Status: DNR DVT Prophylaxis: SCDs Family Communication: No family at the bedside   Disposition Plan:     Status is: Inpatient  Remains inpatient appropriate because:Inpatient level of care appropriate due to severity of illness   Dispo: The patient is from: SNF              Anticipated d/c is to: TBD              Anticipated d/c date is: 2 days              Patient currently is not medically stable to d/c.      Time Spent in minutes   35 minutes  Procedures:  None  Consultants:   Palliative medicine  Antimicrobials:   Anti-infectives (From admission, onward)   Start     Dose/Rate Route Frequency Ordered Stop   04/26/20 1400  meropenem (MERREM) 1 g in sodium chloride 0.9 % 100 mL IVPB        1 g 200 mL/hr over 30 Minutes Intravenous Every 12 hours 04/26/20 0812     04/26/20 0430  ceftazidime-avibactam (AVYCAZ) 0.94 g in dextrose 5 % 50 mL IVPB  Status:  Discontinued        0.94 g 25 mL/hr over 2 Hours Intravenous Every 24 hours 04/26/20 0342 04/26/20 0812   04/25/20 2200  cefTRIAXone (ROCEPHIN) 1 g in sodium chloride 0.9 % 100 mL IVPB  Status:  Discontinued        1 g 200 mL/hr over 30 Minutes Intravenous Every 24 hours 04/25/20 2109 04/26/20 0342   04/25/20 1215  piperacillin-tazobactam (ZOSYN) IVPB 3.375 g        3.375 g 100 mL/hr over 30 Minutes Intravenous  Once 04/25/20 1213 04/25/20 1359           Medications  Scheduled Meds: . aspirin EC  81 mg Oral Daily  . Chlorhexidine Gluconate Cloth  6 each Topical Daily  . finasteride  5 mg Oral Daily  . QUEtiapine  12.5 mg Oral QHS  . tamsulosin  0.4 mg Oral BID  . zolpidem  5 mg Oral QHS   Continuous Infusions: . dextrose 100 mL/hr at 04/27/20 0200  . meropenem (MERREM) IV 1 g (04/27/20 1348)   PRN Meds:.acetaminophen **OR** acetaminophen, hydrALAZINE, LORazepam      Subjective:   Christian Warren was seen and examined today.  Somnolent, not following commands, no acute fevers or chills, nausea or vomiting.  Patient refusing meds  Objective:   Vitals:   04/26/20 1006 04/26/20 2150 04/27/20 0455 04/27/20 1300  BP: (!) 142/94 109/70 119/72 (!) 148/88  Pulse: 94 66 86 84  Resp: 17 16 16 16   Temp: 97.6 F (36.4 C) 98.5 F (36.9 C) 98.4 F (36.9 C) 97.9 F (36.6 C)  TempSrc: Oral Axillary  Axillary  SpO2: 100% 100% 99% 100%  Weight:      Height:        Intake/Output Summary (Last 24 hours) at 04/27/2020 1419 Last data filed at 04/27/2020 0800 Gross per 24 hour  Intake 2162.38 ml  Output 900 ml  Net 1262.38 ml     Wt Readings from Last 3 Encounters:  04/26/20 87 kg  03/15/20 85 kg  03/10/20 84.6 kg     Exam  General: Somnolent, keeping his eyes closed, mumbling  Cardiovascular: S1 S2 auscultated, no murmurs, RRR  Respiratory: Decreased breath sound at the bases  Gastrointestinal: Soft, nontender, nondistended, + bowel sounds  Ext: no pedal edema bilaterally, abrasions on bilateral lower extremities  Neuro: not following commands  Musculoskeletal: No digital cyanosis, clubbing  Skin: Bilateral lower extremities abrasions  Psych: dementia   Data Reviewed:  I have personally reviewed following labs and imaging studies  Micro Results Recent Results (from the past 240 hour(Warren))  Urine culture     Status: Abnormal   Collection Time: 04/25/20 11:37 AM   Specimen: Urine, Clean Catch  Result  Value Ref Range Status   Specimen Description   Final    URINE, CLEAN CATCH Performed at [facility], [address] [place], [place]    Special Requests   Final    NONE Performed at [facility]  [facility], [address] [place], [place] [address]    Culture 30,000 COLONIES/mL PSEUDOMONAS AERUGINOSA (A)  Final   Report Status 04/27/2020 FINAL  Final   Organism ID, Bacteria PSEUDOMONAS AERUGINOSA (A)  Final      Susceptibility   Pseudomonas aeruginosa - MIC*    CEFTAZIDIME 2 SENSITIVE Sensitive     CIPROFLOXACIN <=0.25 SENSITIVE Sensitive     GENTAMICIN 4 SENSITIVE Sensitive     IMIPENEM 2 SENSITIVE Sensitive     PIP/TAZO 8 SENSITIVE Sensitive     CEFEPIME 2 SENSITIVE Sensitive     * 30,000 COLONIES/mL PSEUDOMONAS AERUGINOSA  Culture, blood (routine x 2)     Status: None (Preliminary result)   Collection Time: 04/25/20 11:37 AM   Specimen: BLOOD  Result Value Ref Range Status   Specimen Description   Final    BLOOD LEFT ANTECUBITAL Performed at [facility], [address] [place], [place] [address]    Special Requests   Final    BOTTLES DRAWN AEROBIC AND ANAEROBIC Blood Culture adequate volume Performed at [facility], [address], [place], [place] [address]    Culture   Final    NO GROWTH 2 DAYS Performed at [facility], [address] [place], [place] [address]    Report Status PENDING  Incomplete  Culture, blood (routine x 2)     Status: Abnormal (Preliminary result)   Collection Time: 04/25/20 11:37 AM   Specimen: BLOOD  Result Value Ref Range Status   Specimen Description   Final    BLOOD BLOOD LEFT FOREARM Performed at [facility], [address], [place], [place] [address]    Special Requests   Final    BOTTLES DRAWN AEROBIC AND ANAEROBIC Blood Culture results may not be optimal due to an inadequate volume of blood received in culture  bottles Performed at [facility], [address], [place], [place] [address]    Culture  Setup Time   Final    IN BOTH AEROBIC AND ANAEROBIC BOTTLES GRAM NEGATIVE RODS Organism ID to follow CRITICAL RESULT CALLED TO, READ BACK BY AND VERIFIED WITH: E JACKSON PHARMD 04/26/20 0251 JDW    Culture (A)  Final    ESCHERICHIA COLI SUSCEPTIBILITIES TO FOLLOW Performed at [facility], [address] [place], [place] [address]    Report Status PENDING  Incomplete  Blood Culture ID Panel (Reflexed)     Status: Abnormal   Collection Time: 04/25/20 11:37 AM  Result Value Ref Range Status   Enterococcus faecalis NOT DETECTED NOT DETECTED Final   Enterococcus Faecium NOT DETECTED NOT DETECTED Final   Listeria monocytogenes NOT DETECTED NOT DETECTED Final   Staphylococcus species NOT DETECTED NOT DETECTED Final   Staphylococcus aureus (BCID) NOT DETECTED NOT DETECTED Final   Staphylococcus epidermidis NOT DETECTED NOT DETECTED Final   Staphylococcus lugdunensis NOT DETECTED NOT DETECTED Final   Streptococcus species NOT DETECTED NOT DETECTED Final   Streptococcus agalactiae NOT DETECTED NOT DETECTED Final   Streptococcus pneumoniae NOT DETECTED NOT DETECTED Final   Streptococcus pyogenes NOT DETECTED NOT DETECTED Final   A.calcoaceticus-baumannii NOT DETECTED NOT DETECTED Final   Bacteroides fragilis NOT DETECTED NOT DETECTED Final   Enterobacterales DETECTED (A) NOT DETECTED Final    Comment: Enterobacterales represent a large order of gram negative bacteria, not a single organism. CRITICAL RESULT CALLED TO, READ BACK BY AND VERIFIED WITH: E JACKSON PHARMD 04/26/20 0251 JDW  Enterobacter cloacae complex NOT DETECTED NOT DETECTED Final   Escherichia coli DETECTED (A) NOT DETECTED Final    Comment: CRITICAL RESULT CALLED TO, READ BACK BY AND VERIFIED WITH: E JACKSON PHARMD 04/26/20 0251 JDW    Klebsiella aerogenes NOT DETECTED NOT DETECTED Final   Klebsiella  oxytoca NOT DETECTED NOT DETECTED Final   Klebsiella pneumoniae NOT DETECTED NOT DETECTED Final   Proteus species NOT DETECTED NOT DETECTED Final   Salmonella species NOT DETECTED NOT DETECTED Final   Serratia marcescens NOT DETECTED NOT DETECTED Final   Haemophilus influenzae NOT DETECTED NOT DETECTED Final   Neisseria meningitidis NOT DETECTED NOT DETECTED Final   Pseudomonas aeruginosa NOT DETECTED NOT DETECTED Final   Stenotrophomonas maltophilia NOT DETECTED NOT DETECTED Final   Candida albicans NOT DETECTED NOT DETECTED Final   Candida auris NOT DETECTED NOT DETECTED Final   Candida glabrata NOT DETECTED NOT DETECTED Final   Candida krusei NOT DETECTED NOT DETECTED Final   Candida parapsilosis NOT DETECTED NOT DETECTED Final   Candida tropicalis NOT DETECTED NOT DETECTED Final   Cryptococcus neoformans/gattii NOT DETECTED NOT DETECTED Final   CTX-M ESBL DETECTED (A) NOT DETECTED Final    Comment: CRITICAL RESULT CALLED TO, READ BACK BY AND VERIFIED WITH: E JACKSON PHARMD 04/26/20 0251 JDW (NOTE) Extended spectrum beta-lactamase detected. Recommend a carbapenem as initial therapy.      Carbapenem resistance IMP NOT DETECTED NOT DETECTED Final   Carbapenem resistance KPC NOT DETECTED NOT DETECTED Final   Carbapenem resistance NDM NOT DETECTED NOT DETECTED Final   Carbapenem resist OXA 48 LIKE NOT DETECTED NOT DETECTED Final   Carbapenem resistance VIM NOT DETECTED NOT DETECTED Final    Comment: Performed at [facility], [address], [place], [place] [address]  Respiratory Panel by RT PCR (Flu A&B, Covid) - Urine, Catheterized     Status: None   Collection Time: 04/25/20 12:14 PM   Specimen: Urine, Catheterized; Nasopharyngeal  Result Value Ref Range Status   SARS Coronavirus 2 by RT PCR NEGATIVE NEGATIVE Final    Comment: (NOTE) SARS-CoV-2 target nucleic acids are NOT DETECTED.  The SARS-CoV-2 RNA is generally detectable in upper respiratoy specimens during the  acute phase of infection. The lowest concentration of SARS-CoV-2 viral copies this assay can detect is 131 copies/mL. A negative result does not preclude SARS-Cov-2 infection and should not be used as the sole basis for treatment or other patient management decisions. A negative result may occur with  improper specimen collection/handling, submission of specimen other than nasopharyngeal swab, presence of viral mutation(Warren) within the areas targeted by this assay, and inadequate number of viral copies (<131 copies/mL). A negative result must be combined with clinical observations, patient history, and epidemiological information. The expected result is Negative.  Fact Sheet for Patients:  https://www.moore.com/  Fact Sheet for Healthcare Providers:  https://www.young.biz/  This test is no t yet approved or cleared by the [place] FDA and  has been authorized for detection and/or diagnosis of SARS-CoV-2 by FDA under an Emergency Use Authorization (EUA). This EUA will remain  in effect (meaning this test can be used) for the duration of the COVID-19 declaration under Section 564(b)(1) of the Act, 21 [place]C. section 360bbb-3(b)(1), unless the authorization is terminated or revoked sooner.     Influenza A by PCR NEGATIVE NEGATIVE Final   Influenza B by PCR NEGATIVE NEGATIVE Final    Comment: (NOTE) The Xpert Xpress SARS-CoV-2/FLU/RSV assay is intended as an aid in  the  diagnosis of influenza from Nasopharyngeal swab specimens and  should not be used as a sole basis for treatment. Nasal washings and  aspirates are unacceptable for Xpert Xpress SARS-CoV-2/FLU/RSV  testing.  Fact Sheet for Patients: https://www.moore.com/  Fact Sheet for Healthcare Providers: https://www.young.biz/  This test is not yet approved or cleared by the [place] FDA and  has been authorized for detection and/or diagnosis  of SARS-CoV-2 by  FDA under an Emergency Use Authorization (EUA). This EUA will remain  in effect (meaning this test can be used) for the duration of the  Covid-19 declaration under Section 564(b)(1) of the Act, 21  U.[place]C. section 360bbb-3(b)(1), unless the authorization is  terminated or revoked. Performed at [facility], [address] [place], [place] [address]     Radiology Reports DG Abdomen 1 View  Result Date: 04/25/2020 CLINICAL DATA:  Abdominal pain EXAM: ABDOMEN - 1 VIEW COMPARISON:  CT 01/27/2020 FINDINGS: Nonobstructive bowel gas pattern. No organomegaly or free air. Rounded calcified structures in the pelvis measuring up to 1.7 cm. These correspond to the bladder stones seen on prior CT. Vascular calcifications. Diffuse degenerative changes in the lumbar spine. No acute bony abnormality. IMPRESSION: Bladder stones again noted as seen on prior CT. No bowel obstruction. No acute findings. Electronically Signed   By: Charlett Nose M.D.   On: 04/25/2020 18:37   CT Head Wo Contrast  Result Date: 04/25/2020 CLINICAL DATA:  Mental status change. Nonverbal. Lethargy. Renal insufficiency. EXAM: CT HEAD WITHOUT CONTRAST TECHNIQUE: Contiguous axial images were obtained from the base of the skull through the vertex without intravenous contrast. COMPARISON:  03/03/2020 FINDINGS: Brain: Advanced cerebral/cerebellar atrophy. Marked low density in the periventricular white matter likely related to small vessel disease. No mass lesion, hemorrhage, hydrocephalus, acute infarct, intra-axial, or extra-axial fluid collection. Vascular: Intracranial atherosclerosis. Skull: Motion degradation.  No significant soft tissue swelling. Sinuses/Orbits: Normal imaged portions of the orbits and globes. Clear paranasal sinuses and mastoid air cells. Other: None. IMPRESSION: 1. Motion degradation. 2. No acute intracranial abnormality. 3. Cerebral atrophy and small vessel ischemic change.  Electronically Signed   By: Jeronimo Greaves M.D.   On: 04/25/2020 14:30   US RENAL  Result Date: 04/25/2020 CLINICAL DATA:  Acute kidney injury EXAM: RENAL / URINARY TRACT ULTRASOUND COMPLETE COMPARISON:  None. FINDINGS: Right Kidney: Renal measurements: 9.4 x 4.3 x 4.6 cm = volume: 96.8 mL. Echogenicity within normal limits. Mild pelvicaliectasis is seen. No shadowing stones are noted. Left Kidney: Renal measurements: 11.1 x 5.8 x 5.7 cm = volume: 192 mL. Echogenicity within normal limits. Mild pelvicaliectasis is seen. No shadowing stones are noted. Bladder: Foley catheter is noted. Other: None. IMPRESSION: Mild bilateral hydronephrosis.  No shadowing stones. Electronically Signed   By: Jonna Clark M.D.   On: 04/25/2020 20:47   DG Chest Port 1 View  Result Date: 04/25/2020 CLINICAL DATA:  Weakness. EXAM: PORTABLE CHEST 1 VIEW COMPARISON:  Chest radiograph 03/03/2020 and earlier. Chest CT 02/04/2020. FINDINGS: Shallow inspiration radiograph. Heart size at the upper limits of normal, unchanged. Aortic atherosclerosis. No appreciable airspace consolidation or pulmonary edema. No evidence of pleural effusion or pneumothorax. No acute bony abnormality identified. IMPRESSION: Shallow inspiration radiograph. No evidence of acute cardiopulmonary abnormality. Aortic Atherosclerosis (ICD10-I70.0). Electronically Signed   By: Jackey Loge DO   On: 04/25/2020 10:49    Lab Data:  CBC: Recent Labs  Lab 04/25/20 1137 04/26/20 0346 04/27/20 0358  WBC 11.6* 9.1 7.5  NEUTROABS 8.6*  --  4.7  HGB 10.6* 9.4* 9.4*  HCT 33.7* 29.8* 31.5*  MCV 96.3 95.5 98.4  PLT 346 299 312   Basic Metabolic Panel: Recent Labs  Lab 04/25/20 1137 04/26/20 0346 04/27/20 0358  NA 150* 154* 147*  K 3.7 4.3 3.4*  CL 115* 121* 119*  CO2 20* 17* 19*  GLUCOSE 133* 113* 119*  BUN 92* 81* 64*  CREATININE 4.16* 3.52* 2.81*  CALCIUM 10.5* 10.0 9.9  MG  --   --  2.2  PHOS  --   --  2.8   GFR: Estimated Creatinine  Clearance: 21.5 mL/min (A) (by C-G formula based on SCr of 2.81 mg/dL (H)). Liver Function Tests: Recent Labs  Lab 04/25/20 1137 04/26/20 0346 04/27/20 0358  AST ALT ALKPHOS 91 77 77  BILITOT 0.6 0.6 0.5  PROT 7.4 6.6 6.4*  ALBUMIN 3.4* 3.1* 3.0*   Recent Labs  Lab 04/25/20 1137  LIPASE 23   No results for input(Warren): AMMONIA in the last 168 hours. Coagulation Profile: Recent Labs  Lab 04/25/20 1137  INR 1.2   Cardiac Enzymes: No results for input(Warren): CKTOTAL, CKMB, CKMBINDEX, TROPONINI in the last 168 hours. BNP (last 3 results) No results for input(Warren): PROBNP in the last 8760 hours. HbA1C: No results for input(Warren): HGBA1C in the last 72 hours. CBG: No results for input(Warren): GLUCAP in the last 168 hours. Lipid Profile: No results for input(Warren): CHOL, HDL, LDLCALC, TRIG, CHOLHDL, LDLDIRECT in the last 72 hours. Thyroid Function Tests: No results for input(Warren): TSH, T4TOTAL, FREET4, T3FREE, THYROIDAB in the last 72 hours. Anemia Panel: No results for input(Warren): VITAMINB12, FOLATE, FERRITIN, TIBC, IRON, RETICCTPCT in the last 72 hours. Urine analysis:    Component Value Date/Time   COLORURINE YELLOW 04/25/2020 1137   APPEARANCEUR TURBID (A) 04/25/2020 1137   LABSPEC 1.011 04/25/2020 1137   PHURINE 6.0 04/25/2020 1137   GLUCOSEU NEGATIVE 04/25/2020 1137   HGBUR SMALL (A) 04/25/2020 1137   BILIRUBINUR NEGATIVE 04/25/2020 1137   KETONESUR NEGATIVE 04/25/2020 1137   PROTEINUR 100 (A) 04/25/2020 1137   NITRITE NEGATIVE 04/25/2020 1137   LEUKOCYTESUR LARGE (A) 04/25/2020 1137     Christian Warren M.D. [facility] 04/27/2020, 2:19 PM   Call night coverage person covering after 7pm

## 2020-04-28 DIAGNOSIS — Z7189 Other specified counseling: Secondary | ICD-10-CM

## 2020-04-28 DIAGNOSIS — R1310 Dysphagia, unspecified: Secondary | ICD-10-CM

## 2020-04-28 DIAGNOSIS — N179 Acute kidney failure, unspecified: Secondary | ICD-10-CM | POA: Diagnosis not present

## 2020-04-28 DIAGNOSIS — F0390 Unspecified dementia without behavioral disturbance: Secondary | ICD-10-CM

## 2020-04-28 DIAGNOSIS — Z515 Encounter for palliative care: Secondary | ICD-10-CM

## 2020-04-28 DIAGNOSIS — E870 Hyperosmolality and hypernatremia: Secondary | ICD-10-CM | POA: Diagnosis not present

## 2020-04-28 DIAGNOSIS — R7881 Bacteremia: Secondary | ICD-10-CM | POA: Diagnosis not present

## 2020-04-28 DIAGNOSIS — R400 Somnolence: Secondary | ICD-10-CM | POA: Diagnosis not present

## 2020-04-28 LAB — CBC WITH DIFFERENTIAL/PLATELET
Abs Immature Granulocytes: 0.94 10*3/uL — ABNORMAL HIGH (ref 0.00–0.07)
Basophils Absolute: 0.1 10*3/uL (ref 0.0–0.1)
Basophils Relative: 1 %
Eosinophils Absolute: 0.3 10*3/uL (ref 0.0–0.5)
Eosinophils Relative: 3 %
HCT: 32.8 % — ABNORMAL LOW (ref 39.0–52.0)
Hemoglobin: 10.2 g/dL — ABNORMAL LOW (ref 13.0–17.0)
Immature Granulocytes: 10 %
Lymphocytes Relative: 15 %
Lymphs Abs: 1.3 10*3/uL (ref 0.7–4.0)
MCH: 29.8 pg (ref 26.0–34.0)
MCHC: 31.1 g/dL (ref 30.0–36.0)
MCV: 95.9 fL (ref 80.0–100.0)
Monocytes Absolute: 0.8 10*3/uL (ref 0.1–1.0)
Monocytes Relative: 9 %
Neutro Abs: 5.6 10*3/uL (ref 1.7–7.7)
Neutrophils Relative %: 62 %
Platelets: 346 10*3/uL (ref 150–400)
RBC: 3.42 MIL/uL — ABNORMAL LOW (ref 4.22–5.81)
RDW: 15.4 % (ref 11.5–15.5)
WBC: 9.1 10*3/uL (ref 4.0–10.5)
nRBC: 0 % (ref 0.0–0.2)

## 2020-04-28 LAB — CULTURE, BLOOD (ROUTINE X 2)

## 2020-04-28 LAB — COMPREHENSIVE METABOLIC PANEL
ALT: 17 U/L (ref 0–44)
AST: 23 U/L (ref 15–41)
Albumin: 3 g/dL — ABNORMAL LOW (ref 3.5–5.0)
Alkaline Phosphatase: 75 U/L (ref 38–126)
Anion gap: 10 (ref 5–15)
BUN: 59 mg/dL — ABNORMAL HIGH (ref 8–23)
CO2: 20 mmol/L — ABNORMAL LOW (ref 22–32)
Calcium: 10 mg/dL (ref 8.9–10.3)
Chloride: 116 mmol/L — ABNORMAL HIGH (ref 98–111)
Creatinine, Ser: 2.36 mg/dL — ABNORMAL HIGH (ref 0.61–1.24)
GFR, Estimated: 26 mL/min — ABNORMAL LOW (ref >60)
Glucose, Bld: 128 mg/dL — ABNORMAL HIGH (ref 70–99)
Potassium: 3.2 mmol/L — ABNORMAL LOW (ref 3.5–5.1)
Sodium: 146 mmol/L — ABNORMAL HIGH (ref 135–145)
Total Bilirubin: 0.6 mg/dL (ref 0.3–1.2)
Total Protein: 6 g/dL — ABNORMAL LOW (ref 6.5–8.1)

## 2020-04-28 MED ORDER — POTASSIUM CHLORIDE 10 MEQ/100ML IV SOLN
10.0000 meq | INTRAVENOUS | Status: AC
  Administered 2020-04-28 (×3): 10 meq via INTRAVENOUS
  Filled 2020-04-28 (×3): qty 100

## 2020-04-28 MED ORDER — GLYCOPYRROLATE 0.2 MG/ML IJ SOLN
0.4000 mg | INTRAMUSCULAR | Status: DC | PRN
  Administered 2020-04-28: 0.4 mg via INTRAVENOUS
  Filled 2020-04-28: qty 2

## 2020-04-28 MED ORDER — FENTANYL CITRATE (PF) 100 MCG/2ML IJ SOLN: 12.5000 ug | INTRAMUSCULAR | Status: DC | PRN

## 2020-04-28 NOTE — Consult Note (Signed)
Consultation Note Date: 04/28/2020   Patient Name: Christian Warren.  DOB: 1933-01-23  MRN: [id number]  Age / Sex: 86 y.o., male  PCP: Barnie Mort, NP Referring Physician: Mendel Corning, MD  Reason for Consultation: Establishing goals of care  HPI/Patient Profile: 86 y.o. male  with past medical history of dementia, hypertension, BPH, chronic indwelling foley, CKD stage 3a admitted on 04/25/2020 with abd pain, tachycardia, confusion with UTI in setting of underlying dementia along with AKI and hypernatremia. Also found to have bacteremia. Refusing intake and pills. High risk for aspiration with severe dysphagia. Completed MOST form reviewed in electronic chart confirming DNR and NO feeding tubes.   Clinical Assessment and Goals of Care: I met today at [name]'s bedside. He is sleeping soundly but does respond to verbal questions. He does not answer consistently and some responses do not make sense. He does not open eyes. He is calm and no signs of discomfort. RN reports he was more alert this morning with eyes open but similar response and that he consistently denies hunger or thirst.   I called and spoke with niece, Christian Warren, who is next of kin. We discussed current state with UTI, bacteremia but more importantly his high aspiration risk and no desire for food or drink. We discussed natural trajectory of dementia that is accelerated by acute infections and hospitalizations. Discussed end stage is poor intake and swallowing function in which there is no treatment for this. Sandy understands. We discussed consideration of comfort care and hospice facility. Christian Warren is open and would consider transition to hospice in [place] or [place]. She would like to speak with her husband about this decision tonight and let us know her final decision tomorrow. She has palliative contact number to update my colleagues when she has  reached a decision.   All questions/concerns addressed. Emotional support provided. Updated Dr. Tana Coast.   Primary Decision Maker NEXT OF KIN niece Christian Warren    SUMMARY OF RECOMMENDATIONS   - Family leaning towards comfort care and hospice facility placement  Code Status/Advance Care Planning:  DNR   Symptom Management:   No current symptoms.   As needed medications made available for pain, shortness of breath, anxiety, secretions.   Palliative Prophylaxis:   Aspiration, Delirium Protocol, Frequent Pain Assessment, Oral Care and Turn Reposition  Prognosis:   < 2 weeks  Discharge Planning: Likely hospice facility.      Primary Diagnoses: Present on Admission: . UTI (urinary tract infection) . Bacteremia due to Escherichia coli . AKI (acute kidney injury) ([facility]) . Altered mental status . CKD (chronic kidney disease) stage 3, GFR 30-59 ml/min (H[facility]) . Essential hypertension . Urinary retention . Severe dehydration   I have reviewed the medical record, interviewed the patient and family, and examined the patient. The following aspects are pertinent.  Past Medical History:  Diagnosis Date  . Arthritis   . Hypertension    Social History   Socioeconomic History  . Marital status: Divorced    Spouse name: Not on  file  . Number of children: Not on file  . Years of education: Not on file  . Highest education level: Not on file  Occupational History  . Not on file  Tobacco Use  . Smoking status: Never Smoker  . Smokeless tobacco: Current User    Types: Chew  Vaping Use  . Vaping Use: Never used  Substance and Sexual Activity  . Alcohol use: Yes    Alcohol/week: 3.0 standard drinks    Types: 3 Cans of beer per week  . Drug use: Never  . Sexual activity: Not Currently  Other Topics Concern  . Not on file  Social History Narrative  . Not on file   Social Determinants of Health   Financial Resource Strain:   . Difficulty of Paying Living Expenses: Not on file   Food Insecurity:   . Worried About Charity fundraiser in the Last Year: Not on file  . Ran Out of Food in the Last Year: Not on file  Transportation Needs:   . Lack of Transportation (Medical): Not on file  . Lack of Transportation (Non-Medical): Not on file  Physical Activity:   . Days of Exercise per Week: Not on file  . Minutes of Exercise per Session: Not on file  Stress:   . Feeling of Stress : Not on file  Social Connections:   . Frequency of Communication with Friends and Family: Not on file  . Frequency of Social Gatherings with Friends and Family: Not on file  . Attends Religious Services: Not on file  . Active Member of Clubs or Organizations: Not on file  . Attends Archivist Meetings: Not on file  . Marital Status: Not on file   History reviewed. No pertinent family history. Scheduled Meds: . aspirin EC  81 mg Oral Daily  . Chlorhexidine Gluconate Cloth  6 each Topical Daily  . finasteride  5 mg Oral Daily  . QUEtiapine  12.5 mg Oral QHS  . tamsulosin  0.4 mg Oral BID  . zolpidem  5 mg Oral QHS   Continuous Infusions: . dextrose 100 mL/hr at 04/28/20 1329  . meropenem (MERREM) IV 1 g (04/28/20 1330)  . potassium chloride     PRN Meds:.acetaminophen **OR** acetaminophen, hydrALAZINE, LORazepam Allergies  Allergen Reactions  . Orange Juice [Orange Oil] Itching   Review of Systems  Unable to perform ROS: Dementia    Physical Exam Vitals and nursing note reviewed.  Constitutional:      General: He is not in acute distress.    Appearance: He is ill-appearing.  Cardiovascular:     Rate and Rhythm: Normal rate.  Pulmonary:     Effort: Pulmonary effort is normal. No tachypnea, accessory muscle usage or respiratory distress.  Abdominal:     Palpations: Abdomen is soft.  Skin:    Coloration: Skin is pale.  Neurological:     Mental Status: He is lethargic and confused.     Vital Signs: BP (!) 150/89 (BP Location: Right Arm)   Pulse 60   Temp  98.1 F (36.7 C) (Oral)   Resp 18   Ht 6' (1.829 m)   Wt 87 kg   SpO2 100%   BMI 26.01 kg/m  Pain Scale: PAINAD POSS *See Group Information*: 1-Acceptable,Awake and alert Pain Score: 0-No pain   SpO2: SpO2: 100 % O2 Device:SpO2: 100 % O2 Flow Rate: .O2 Flow Rate (L/min): 2 L/min  IO: Intake/output summary:   Intake/Output Summary (Last 24 hours)  at 04/28/2020 1428 Last data filed at 04/28/2020 1000 Gross per 24 hour  Intake 3042.11 ml  Output 800 ml  Net 2242.11 ml    LBM: Last BM Date: 04/25/20 Baseline Weight: Weight: 87 kg Most recent weight: Weight: 87 kg     Palliative Assessment/Data:     Time In: 1430 Time Out: 1500 Time Total: 30 min Greater than 50%  of this time was spent counseling and coordinating care related to the above assessment and plan.  Signed by: Vinie Sill, NP Palliative Medicine Team Pager # [phone] (M-F 8a-5p) Team Phone # [phone] (Nights/Weekends)

## 2020-04-28 NOTE — Progress Notes (Signed)
[facility]                                                                              Patient Demographics  Christian Warren, is a 86 y.o. male, DOB - 1933/04/05, YJE:[id number]  Admit date - 04/25/2020   Admitting Physician Teddy Spike, DO  Outpatient Primary MD for the patient is Retia Passe, NP  Outpatient specialists:   LOS - 3  days   Medical records reviewed and are as summarized below:    Chief Complaint  Patient presents with  . Abdominal Pain  . Altered Mental Status       Brief summary  Patient is a 86 year old male with history of dementia, hypertension, BPH, urinary obstruction the status post chronic indwelling Foley catheter and currently long-term nursing home resident brought to the ER with altered mental status for the last few days.  Reportedly, at baseline he is able to talk some and ambulates with a walker.  Over last few days he has been lethargic and complaining of abdominal pain.  In the emergency room, he was found to be tachycardic, confused.  He had elevated sodium level.  Acute kidney injury.  Urine consistent with UTI.  Blood cultures urine cultures drawn and admitted.   Assessment & Plan    Principal Problem:   Bacteremia due to Escherichia coli,  sepsis present on admission, E. coli UTI in the setting of chronic indwelling Foley catheter -Foley catheter was exchanged, blood cultures positive for ESBL E. coli, continue meropenem  Active Problems: Dementia with acute metabolic encephalopathy - Likely worsened due to #1, remains frail and debilitated, no adequate oral intake, high risk for aspiration per swallow study - Palliative medicine consulted for goals of care, may qualify for residential hospice, discussed with Dr. Neale Burly   Essential hypertension Currently n.p.o., placed on IV hydralazine as needed  Acute kidney injury superimposed on underlying CKD stage IIIa -Worsened due to dehydration, sepsis -Creatinine 3.52  at the time of admission, now improved to 2.36  Severe dehydration/hyperchloremic hypernatremia Sodium 154 at the time of admission, now improving, continue D5 N.p.o. due to high risk aspiration, palliative medicine consulted for goals of care, inadequate p.o. intake, advanced dementia  Hypokalemia -Will replace IV  Chronic urinary obstruction with indwelling Foley catheter -On Flomax and Proscar, Foley catheter exchanged   Code Status: DNR DVT Prophylaxis: SCDs Family Communication: No family at the bedside   Disposition Plan:     Status is: Inpatient  Remains inpatient appropriate because:Inpatient level of care appropriate due to severity of illness   Dispo: The patient is from: SNF              Anticipated d/c is to: TBD              Anticipated d/c date is: 2 days              Patient currently is not medically stable to d/c.  Need palliative medicine goals of care, poor prognosis, advanced dementia, inadequate p.o. intake, admitted with sepsis, UTI, altered mental status      Time Spent in minutes   35  minutes  Procedures:  None  Consultants:   Palliative medicine  Antimicrobials:   Anti-infectives (From admission, onward)   Start     Dose/Rate Route Frequency Ordered Stop   04/26/20 1400  meropenem (MERREM) 1 g in sodium chloride 0.9 % 100 mL IVPB        1 g 200 mL/hr over 30 Minutes Intravenous Every 12 hours 04/26/20 0812     04/26/20 0430  ceftazidime-avibactam (AVYCAZ) 0.94 g in dextrose 5 % 50 mL IVPB  Status:  Discontinued        0.94 g 25 mL/hr over 2 Hours Intravenous Every 24 hours 04/26/20 0342 04/26/20 0812   04/25/20 2200  cefTRIAXone (ROCEPHIN) 1 g in sodium chloride 0.9 % 100 mL IVPB  Status:  Discontinued        1 g 200 mL/hr over 30 Minutes Intravenous Every 24 hours 04/25/20 2109 04/26/20 0342   04/25/20 1215  piperacillin-tazobactam (ZOSYN) IVPB 3.375 g        3.375 g 100 mL/hr over 30 Minutes Intravenous  Once 04/25/20 1213 04/25/20  1359         Medications  Scheduled Meds: . aspirin EC  81 mg Oral Daily  . Chlorhexidine Gluconate Cloth  6 each Topical Daily  . finasteride  5 mg Oral Daily  . QUEtiapine  12.5 mg Oral QHS  . tamsulosin  0.4 mg Oral BID  . zolpidem  5 mg Oral QHS   Continuous Infusions: . dextrose 100 mL/hr at 04/28/20 1329  . meropenem (MERREM) IV 1 g (04/28/20 1330)   PRN Meds:.acetaminophen **OR** acetaminophen, hydrALAZINE, LORazepam      Subjective:   Christian Warren was seen and examined today.  Somnolent, no fevers or chills, does not follow commands.  No active nausea vomiting does not appear to be in pain, has mitten on   Objective:   Vitals:   04/27/20 2156 04/28/20 0421 04/28/20 0623 04/28/20 1053  BP: (!) 149/70 132/87 (!) 156/94 (!) 150/89  Pulse: 93 100 83 60  Resp: Temp: 98 F (36.7 C) 98.2 F (36.8 C) 98 F (36.7 C) 98.1 F (36.7 C)  TempSrc: Oral Oral Oral Oral  SpO2:  99%  100%  Weight:      Height:        Intake/Output Summary (Last 24 hours) at 04/28/2020 1402 Last data filed at 04/28/2020 1610 Gross per 24 hour  Intake 2642.08 ml  Output 800 ml  Net 1842.08 ml     Wt Readings from Last 3 Encounters:  04/26/20 87 kg  03/15/20 85 kg  03/10/20 84.6 kg    Physical Exam  General: Somnolent, mumbling  Cardiovascular: S1 S2 clear, RRR. No pedal edema b/l  Respiratory: CTAB, no wheezing, rales or rhonchi  Gastrointestinal: Soft, nontender, nondistended, NBS  Ext: no pedal edema bilaterally, abrasions on bilateral lower extremities  Neuro: no new deficits  Musculoskeletal: No cyanosis, clubbing  Skin: Bilateral lower extremities abrasions  Psych: dementia   Data Reviewed:  I have personally reviewed following labs and imaging studies  Micro Results Recent Results (from the past 240 hour(s))  Urine culture     Status: Abnormal   Collection Time: 04/25/20 11:37 AM   Specimen: Urine, Clean Catch  Result Value Ref Range  Status   Specimen Description   Final    URINE, CLEAN CATCH Performed at [facility], [address] [place], [place] [address]    Special Requests  Final    NONE Performed at [facility], [address] [place], [place] [address]    Culture 30,000 COLONIES/mL PSEUDOMONAS AERUGINOSA (A)  Final   Report Status 04/27/2020 FINAL  Final   Organism ID, Bacteria PSEUDOMONAS AERUGINOSA (A)  Final      Susceptibility   Pseudomonas aeruginosa - MIC*    CEFTAZIDIME 2 SENSITIVE Sensitive     CIPROFLOXACIN <=0.25 SENSITIVE Sensitive     GENTAMICIN 4 SENSITIVE Sensitive     IMIPENEM 2 SENSITIVE Sensitive     PIP/TAZO 8 SENSITIVE Sensitive     CEFEPIME 2 SENSITIVE Sensitive     * 30,000 COLONIES/mL PSEUDOMONAS AERUGINOSA  Culture, blood (routine x 2)     Status: None (Preliminary result)   Collection Time: 04/25/20 11:37 AM   Specimen: BLOOD  Result Value Ref Range Status   Specimen Description   Final    BLOOD LEFT ANTECUBITAL Performed at [facility], [address] [place], [place] [address]    Special Requests   Final    BOTTLES DRAWN AEROBIC AND ANAEROBIC Blood Culture adequate volume Performed at [facility], [address] [place], [place] [address]    Culture   Final    NO GROWTH 3 DAYS Performed at [facility], [address], [place], [place] [address]    Report Status PENDING  Incomplete  Culture, blood (routine x 2)     Status: Abnormal   Collection Time: 04/25/20 11:37 AM   Specimen: BLOOD  Result Value Ref Range Status   Specimen Description   Final    BLOOD BLOOD LEFT FOREARM Performed at [facility], [address] [place], [place] [address]    Special Requests   Final    BOTTLES DRAWN AEROBIC AND ANAEROBIC Blood Culture results may not be optimal due to an inadequate volume of blood received in culture bottles Performed at [facility],  [address] [place], [place] [address]    Culture  Setup Time   Final    IN BOTH AEROBIC AND ANAEROBIC BOTTLES GRAM NEGATIVE RODS Organism ID to follow CRITICAL RESULT CALLED TO, READ BACK BY AND VERIFIED WITHErling Cruz [facility] 04/26/20 0251 JDW Performed at [facility], [address] [place], [place] [address]    Culture (A)  Final    ESCHERICHIA COLI Confirmed Extended Spectrum Beta-Lactamase Producer (ESBL).  In bloodstream infections from ESBL organisms, carbapenems are preferred over piperacillin/tazobactam. They are shown to have a lower risk of mortality.    Report Status 04/28/2020 FINAL  Final   Organism ID, Bacteria ESCHERICHIA COLI  Final      Susceptibility   Escherichia coli - MIC*    AMPICILLIN >=32 RESISTANT Resistant     CEFAZOLIN >=64 RESISTANT Resistant     CEFEPIME 16 RESISTANT Resistant     CEFTAZIDIME RESISTANT Resistant     CEFTRIAXONE >=64 RESISTANT Resistant     CIPROFLOXACIN >=4 RESISTANT Resistant     GENTAMICIN >=16 RESISTANT Resistant     IMIPENEM <=0.25 SENSITIVE Sensitive     TRIMETH/SULFA <=20 SENSITIVE Sensitive     AMPICILLIN/SULBACTAM >=32 RESISTANT Resistant     PIP/TAZO 16 SENSITIVE Sensitive     * ESCHERICHIA COLI  Blood Culture ID Panel (Reflexed)     Status: Abnormal   Collection Time: 04/25/20 11:37 AM  Result Value Ref Range Status   Enterococcus faecalis NOT DETECTED NOT DETECTED Final   Enterococcus Faecium  NOT DETECTED NOT DETECTED Final   Listeria monocytogenes NOT DETECTED NOT DETECTED Final   Staphylococcus species NOT DETECTED NOT DETECTED Final   Staphylococcus aureus (BCID) NOT DETECTED NOT DETECTED Final   Staphylococcus epidermidis NOT DETECTED NOT DETECTED Final   Staphylococcus lugdunensis NOT DETECTED NOT DETECTED Final   Streptococcus species NOT DETECTED NOT DETECTED Final   Streptococcus agalactiae NOT DETECTED NOT DETECTED Final   Streptococcus pneumoniae NOT DETECTED NOT DETECTED Final   Streptococcus  pyogenes NOT DETECTED NOT DETECTED Final   A.calcoaceticus-baumannii NOT DETECTED NOT DETECTED Final   Bacteroides fragilis NOT DETECTED NOT DETECTED Final   Enterobacterales DETECTED (A) NOT DETECTED Final    Comment: Enterobacterales represent a large order of gram negative bacteria, not a single organism. CRITICAL RESULT CALLED TO, READ BACK BY AND VERIFIED WITH: E JACKSON PHARMD 04/26/20 0251 JDW    Enterobacter cloacae complex NOT DETECTED NOT DETECTED Final   Escherichia coli DETECTED (A) NOT DETECTED Final    Comment: CRITICAL RESULT CALLED TO, READ BACK BY AND VERIFIED WITH: E JACKSON PHARMD 04/26/20 0251 JDW    Klebsiella aerogenes NOT DETECTED NOT DETECTED Final   Klebsiella oxytoca NOT DETECTED NOT DETECTED Final   Klebsiella pneumoniae NOT DETECTED NOT DETECTED Final   Proteus species NOT DETECTED NOT DETECTED Final   Salmonella species NOT DETECTED NOT DETECTED Final   Serratia marcescens NOT DETECTED NOT DETECTED Final   Haemophilus influenzae NOT DETECTED NOT DETECTED Final   Neisseria meningitidis NOT DETECTED NOT DETECTED Final   Pseudomonas aeruginosa NOT DETECTED NOT DETECTED Final   Stenotrophomonas maltophilia NOT DETECTED NOT DETECTED Final   Candida albicans NOT DETECTED NOT DETECTED Final   Candida auris NOT DETECTED NOT DETECTED Final   Candida glabrata NOT DETECTED NOT DETECTED Final   Candida krusei NOT DETECTED NOT DETECTED Final   Candida parapsilosis NOT DETECTED NOT DETECTED Final   Candida tropicalis NOT DETECTED NOT DETECTED Final   Cryptococcus neoformans/gattii NOT DETECTED NOT DETECTED Final   CTX-M ESBL DETECTED (A) NOT DETECTED Final    Comment: CRITICAL RESULT CALLED TO, READ BACK BY AND VERIFIED WITH: E JACKSON PHARMD 04/26/20 0251 JDW (NOTE) Extended spectrum beta-lactamase detected. Recommend a carbapenem as initial therapy.      Carbapenem resistance IMP NOT DETECTED NOT DETECTED Final   Carbapenem resistance KPC NOT DETECTED NOT  DETECTED Final   Carbapenem resistance NDM NOT DETECTED NOT DETECTED Final   Carbapenem resist OXA 48 LIKE NOT DETECTED NOT DETECTED Final   Carbapenem resistance VIM NOT DETECTED NOT DETECTED Final    Comment: Performed at [facility], [address] [place], [place] [address]  Respiratory Panel by RT PCR (Flu A&B, Covid) - Urine, Catheterized     Status: None   Collection Time: 04/25/20 12:14 PM   Specimen: Urine, Catheterized; Nasopharyngeal  Result Value Ref Range Status   SARS Coronavirus 2 by RT PCR NEGATIVE NEGATIVE Final    Comment: (NOTE) SARS-CoV-2 target nucleic acids are NOT DETECTED.  The SARS-CoV-2 RNA is generally detectable in upper respiratoy specimens during the acute phase of infection. The lowest concentration of SARS-CoV-2 viral copies this assay can detect is 131 copies/mL. A negative result does not preclude SARS-Cov-2 infection and should not be used as the sole basis for treatment or other patient management decisions. A negative result may occur with  improper specimen collection/handling, submission of specimen other than nasopharyngeal swab, presence of viral mutation(s) within the areas targeted by this assay, and inadequate number of  viral copies (<131 copies/mL). A negative result must be combined with clinical observations, patient history, and epidemiological information. The expected result is Negative.  Fact Sheet for Patients:  https://www.moore.com/  Fact Sheet for Healthcare Providers:  https://www.young.biz/  This test is no t yet approved or cleared by the [place] FDA and  has been authorized for detection and/or diagnosis of SARS-CoV-2 by FDA under an Emergency Use Authorization (EUA). This EUA will remain  in effect (meaning this test can be used) for the duration of the COVID-19 declaration under Section 564(b)(1) of the Act, 21 U.S.C. section 360bbb-3(b)(1), unless the authorization is  terminated or revoked sooner.     Influenza A by PCR NEGATIVE NEGATIVE Final   Influenza B by PCR NEGATIVE NEGATIVE Final    Comment: (NOTE) The Xpert Xpress SARS-CoV-2/FLU/RSV assay is intended as an aid in  the diagnosis of influenza from Nasopharyngeal swab specimens and  should not be used as a sole basis for treatment. Nasal washings and  aspirates are unacceptable for Xpert Xpress SARS-CoV-2/FLU/RSV  testing.  Fact Sheet for Patients: https://www.moore.com/  Fact Sheet for Healthcare Providers: https://www.young.biz/  This test is not yet approved or cleared by the [place] FDA and  has been authorized for detection and/or diagnosis of SARS-CoV-2 by  FDA under an Emergency Use Authorization (EUA). This EUA will remain  in effect (meaning this test can be used) for the duration of the  Covid-19 declaration under Section 564(b)(1) of the Act, 21  U.S.C. section 360bbb-3(b)(1), unless the authorization is  terminated or revoked. Performed at [facility], [address] [place], [place] [address]     Radiology Reports DG Abdomen 1 View  Result Date: 04/25/2020 CLINICAL DATA:  Abdominal pain EXAM: ABDOMEN - 1 VIEW COMPARISON:  CT 01/27/2020 FINDINGS: Nonobstructive bowel gas pattern. No organomegaly or free air. Rounded calcified structures in the pelvis measuring up to 1.7 cm. These correspond to the bladder stones seen on prior CT. Vascular calcifications. Diffuse degenerative changes in the lumbar spine. No acute bony abnormality. IMPRESSION: Bladder stones again noted as seen on prior CT. No bowel obstruction. No acute findings. Electronically Signed   By: Charlett Nose M.D.   On: 04/25/2020 18:37   CT Head Wo Contrast  Result Date: 04/25/2020 CLINICAL DATA:  Mental status change. Nonverbal. Lethargy. Renal insufficiency. EXAM: CT HEAD WITHOUT CONTRAST TECHNIQUE: Contiguous axial images were obtained from the  base of the skull through the vertex without intravenous contrast. COMPARISON:  03/03/2020 FINDINGS: Brain: Advanced cerebral/cerebellar atrophy. Marked low density in the periventricular white matter likely related to small vessel disease. No mass lesion, hemorrhage, hydrocephalus, acute infarct, intra-axial, or extra-axial fluid collection. Vascular: Intracranial atherosclerosis. Skull: Motion degradation.  No significant soft tissue swelling. Sinuses/Orbits: Normal imaged portions of the orbits and globes. Clear paranasal sinuses and mastoid air cells. Other: None. IMPRESSION: 1. Motion degradation. 2. No acute intracranial abnormality. 3. Cerebral atrophy and small vessel ischemic change. Electronically Signed   By: Jeronimo Greaves M.D.   On: 04/25/2020 14:30   US RENAL  Result Date: 04/25/2020 CLINICAL DATA:  Acute kidney injury EXAM: RENAL / URINARY TRACT ULTRASOUND COMPLETE COMPARISON:  None. FINDINGS: Right Kidney: Renal measurements: 9.4 x 4.3 x 4.6 cm = volume: 96.8 mL. Echogenicity within normal limits. Mild pelvicaliectasis is seen. No shadowing stones are noted. Left Kidney: Renal measurements: 11.1 x 5.8 x 5.7 cm = volume: 192 mL. Echogenicity within normal limits. Mild pelvicaliectasis is seen. No shadowing stones are  noted. Bladder: Foley catheter is noted. Other: None. IMPRESSION: Mild bilateral hydronephrosis.  No shadowing stones. Electronically Signed   By: Jonna Clark M.D.   On: 04/25/2020 20:47   DG Chest Port 1 View  Result Date: 04/25/2020 CLINICAL DATA:  Weakness. EXAM: PORTABLE CHEST 1 VIEW COMPARISON:  Chest radiograph 03/03/2020 and earlier. Chest CT 02/04/2020. FINDINGS: Shallow inspiration radiograph. Heart size at the upper limits of normal, unchanged. Aortic atherosclerosis. No appreciable airspace consolidation or pulmonary edema. No evidence of pleural effusion or pneumothorax. No acute bony abnormality identified. IMPRESSION: Shallow inspiration radiograph. No evidence of  acute cardiopulmonary abnormality. Aortic Atherosclerosis (ICD10-I70.0). Electronically Signed   By: Jackey Loge DO   On: 04/25/2020 10:49    Lab Data:  CBC: Recent Labs  Lab 04/25/20 1137 04/26/20 0346 04/27/20 0358 04/28/20 0412  WBC 11.6* 9.1 7.5 9.1  NEUTROABS 8.6*  --  4.7 5.6  HGB 10.6* 9.4* 9.4* 10.2*  HCT 33.7* 29.8* 31.5* 32.8*  MCV 96.3 95.5 98.4 95.9  PLT 346 299 312 346   Basic Metabolic Panel: Recent Labs  Lab 04/25/20 1137 04/26/20 0346 04/27/20 0358 04/28/20 0412  NA 150* 154* 147* 146*  K 3.7 4.3 3.4* 3.2*  CL 115* 121* 119* 116*  CO2 20* 17* 19* 20*  GLUCOSE 133* 113* 119* 128*  BUN 92* 81* 64* 59*  CREATININE 4.16* 3.52* 2.81* 2.36*  CALCIUM 10.5* 10.0 9.9 10.0  MG  --   --  2.2  --   PHOS  --   --  2.8  --    GFR: Estimated Creatinine Clearance: 25.6 mL/min (A) (by C-G formula based on SCr of 2.36 mg/dL (H)). Liver Function Tests: Recent Labs  Lab 04/25/20 1137 04/26/20 0346 04/27/20 0358 04/28/20 0412  AST ALT ALKPHOS 91 77 77 75  BILITOT 0.6 0.6 0.5 0.6  PROT 7.4 6.6 6.4* 6.0*  ALBUMIN 3.4* 3.1* 3.0* 3.0*   Recent Labs  Lab 04/25/20 1137  LIPASE 23   No results for input(s): AMMONIA in the last 168 hours. Coagulation Profile: Recent Labs  Lab 04/25/20 1137  INR 1.2   Cardiac Enzymes: No results for input(s): CKTOTAL, CKMB, CKMBINDEX, TROPONINI in the last 168 hours. BNP (last 3 results) No results for input(s): PROBNP in the last 8760 hours. HbA1C: No results for input(s): HGBA1C in the last 72 hours. CBG: No results for input(s): GLUCAP in the last 168 hours. Lipid Profile: No results for input(s): CHOL, HDL, LDLCALC, TRIG, CHOLHDL, LDLDIRECT in the last 72 hours. Thyroid Function Tests: No results for input(s): TSH, T4TOTAL, FREET4, T3FREE, THYROIDAB in the last 72 hours. Anemia Panel: No results for input(s): VITAMINB12, FOLATE, FERRITIN, TIBC, IRON, RETICCTPCT in the last 72 hours. Urine  analysis:    Component Value Date/Time   COLORURINE YELLOW 04/25/2020 1137   APPEARANCEUR TURBID (A) 04/25/2020 1137   LABSPEC 1.011 04/25/2020 1137   PHURINE 6.0 04/25/2020 1137   GLUCOSEU NEGATIVE 04/25/2020 1137   HGBUR SMALL (A) 04/25/2020 1137   BILIRUBINUR NEGATIVE 04/25/2020 1137   KETONESUR NEGATIVE 04/25/2020 1137   PROTEINUR 100 (A) 04/25/2020 1137   NITRITE NEGATIVE 04/25/2020 1137   LEUKOCYTESUR LARGE (A) 04/25/2020 1137     Matea Stanard M.D. [facility] 04/28/2020, 2:02 PM   Call night coverage person covering after 7pm

## 2020-04-28 NOTE — Plan of Care (Signed)

## 2020-04-28 NOTE — Progress Notes (Signed)
Pt is alert, responds yes and no to certain questions. Pt responds when called by his name. No s/s of pain noted. Pt was turned and positioned every 2 hours.  Monitoring closely.

## 2020-04-28 NOTE — Progress Notes (Signed)
Patient being repositioned frequently with pillow support under bilateral hips and legs, sacral foam dressing in place and skin intact under, many scabbed areas on bilateral lower legs, foley intact and draining well, bilateral safety mitts in place however pt still attempting to pull at o2 tubing and iv tubing, low dose prn ativan given, robinol also given for increase in secretions, attempted to suction but secretions are deep, iv fluids infusing well, vss, patient will acknowledge by grunting when name is called but most speech is incomprehensible, bed locked and low, bed alarm on, floor mats down on both sides, put some relaxation music on the computer to aid in sleep, will continue to monitor.

## 2020-04-29 DIAGNOSIS — E870 Hyperosmolality and hypernatremia: Secondary | ICD-10-CM | POA: Diagnosis not present

## 2020-04-29 DIAGNOSIS — R400 Somnolence: Secondary | ICD-10-CM | POA: Diagnosis not present

## 2020-04-29 DIAGNOSIS — N179 Acute kidney failure, unspecified: Secondary | ICD-10-CM | POA: Diagnosis not present

## 2020-04-29 DIAGNOSIS — R7881 Bacteremia: Secondary | ICD-10-CM | POA: Diagnosis not present

## 2020-04-29 LAB — CBC WITH DIFFERENTIAL/PLATELET
Abs Immature Granulocytes: 0.74 10*3/uL — ABNORMAL HIGH (ref 0.00–0.07)
Basophils Absolute: 0.1 10*3/uL (ref 0.0–0.1)
Basophils Relative: 1 %
Eosinophils Absolute: 0.3 10*3/uL (ref 0.0–0.5)
Eosinophils Relative: 3 %
HCT: 28 % — ABNORMAL LOW (ref 39.0–52.0)
Hemoglobin: 8.5 g/dL — ABNORMAL LOW (ref 13.0–17.0)
Immature Granulocytes: 10 %
Lymphocytes Relative: 18 %
Lymphs Abs: 1.4 10*3/uL (ref 0.7–4.0)
MCH: 29.4 pg (ref 26.0–34.0)
MCHC: 30.4 g/dL (ref 30.0–36.0)
MCV: 96.9 fL (ref 80.0–100.0)
Monocytes Absolute: 0.9 10*3/uL (ref 0.1–1.0)
Monocytes Relative: 11 %
Neutro Abs: 4.5 10*3/uL (ref 1.7–7.7)
Neutrophils Relative %: 57 %
Platelets: 292 10*3/uL (ref 150–400)
RBC: 2.89 MIL/uL — ABNORMAL LOW (ref 4.22–5.81)
RDW: 15.6 % — ABNORMAL HIGH (ref 11.5–15.5)
WBC: 7.8 10*3/uL (ref 4.0–10.5)
nRBC: 0 % (ref 0.0–0.2)

## 2020-04-29 LAB — COMPREHENSIVE METABOLIC PANEL
ALT: 16 U/L (ref 0–44)
AST: 26 U/L (ref 15–41)
Albumin: 2.5 g/dL — ABNORMAL LOW (ref 3.5–5.0)
Alkaline Phosphatase: 65 U/L (ref 38–126)
Anion gap: 6 (ref 5–15)
BUN: 60 mg/dL — ABNORMAL HIGH (ref 8–23)
CO2: 21 mmol/L — ABNORMAL LOW (ref 22–32)
Calcium: 9.3 mg/dL (ref 8.9–10.3)
Chloride: 115 mmol/L — ABNORMAL HIGH (ref 98–111)
Creatinine, Ser: 2.59 mg/dL — ABNORMAL HIGH (ref 0.61–1.24)
GFR, Estimated: 24 mL/min — ABNORMAL LOW (ref >60)
Glucose, Bld: 107 mg/dL — ABNORMAL HIGH (ref 70–99)
Potassium: 4.1 mmol/L (ref 3.5–5.1)
Sodium: 142 mmol/L (ref 135–145)
Total Bilirubin: 0.5 mg/dL (ref 0.3–1.2)
Total Protein: 5.3 g/dL — ABNORMAL LOW (ref 6.5–8.1)

## 2020-04-29 NOTE — Progress Notes (Signed)
Pt is alert and able to engage in conversation. Pt is able to tolerate 1 cup of water and 1 apple sauce. MD is paged via secure chat and SLP consult is placed. Pt's mental status is improving. Monitoring closely.

## 2020-04-29 NOTE — Progress Notes (Signed)
Called by lab with the following result: Potassium 4.1 Notification to on-call physician not necessary as result is within normal range and improved since potassium boluses were given yesterday.

## 2020-04-29 NOTE — Progress Notes (Signed)
[facility]                                                                              Patient Demographics  Christian Warren, is a 86 y.o. male, DOB - 17-Jun-1932, YQM:[id number]  Admit date - 04/25/2020   Admitting Physician Teddy Spike, DO  Outpatient Primary MD for the patient is Retia Passe, NP  Outpatient specialists:   LOS - 4  days   Medical records reviewed and are as summarized below:    Chief Complaint  Patient presents with  . Abdominal Pain  . Altered Mental Status       Brief summary  Patient is a 86 year old male with history of dementia, hypertension, BPH, urinary obstruction the status post chronic indwelling Foley catheter and currently long-term nursing home resident brought to the ER with altered mental status for the last few days.  Reportedly, at baseline he is able to talk some and ambulates with a walker.  Over last few days he has been lethargic and complaining of abdominal pain.  In the emergency room, he was found to be tachycardic, confused.  He had elevated sodium level.  Acute kidney injury.  Urine consistent with UTI.  Blood cultures urine cultures drawn and admitted.   Assessment & Plan    Principal Problem:   Bacteremia due to Escherichia coli,  sepsis present on admission, E. coli UTI in the setting of chronic indwelling Foley catheter -Foley catheter was exchanged - blood cultures positive for ESBL E. coli, continue meropenem  Active Problems: Dementia with acute metabolic encephalopathy - Likely worsened due to #1, remains frail and debilitated, no adequate oral intake, high risk for aspiration per swallow study -Medicine following, may qualify for residential hospice, awaiting decision from family  Essential hypertension BP stable  Acute kidney injury superimposed on underlying CKD stage IIIa -Worsened due to dehydration, sepsis -Creatinine 3.52 at the time of admission, currently 2.5, poor p.o. intake  Severe  dehydration/hyperchloremic hypernatremia Sodium 154 at the time of admission, now improving, continue D5 N.p.o. due to high risk aspiration, palliative medicine consulted for goals of care, inadequate p.o. intake, advanced dementia -Sodium improving  Hypokalemia Resolved  Chronic urinary obstruction with indwelling Foley catheter -On Flomax and Proscar, Foley catheter exchanged   Code Status: DNR DVT Prophylaxis: SCDs Family Communication: No family at the bedside   Disposition Plan:     Status is: Inpatient  Remains inpatient appropriate because:Inpatient level of care appropriate due to severity of illness   Dispo: The patient is from: SNF              Anticipated d/c is to: TBD              Anticipated d/c date is: 2 days              Patient currently is not medically stable to d/c.  poor prognosis, advanced dementia, inadequate p.o. intake, admitted with sepsis, UTI, altered mental status      Time Spent in minutes   25 minutes  Procedures:  None  Consultants:   Palliative medicine  Antimicrobials:   Anti-infectives (From  admission, onward)   Start     Dose/Rate Route Frequency Ordered Stop   04/26/20 1400  meropenem (MERREM) 1 g in sodium chloride 0.9 % 100 mL IVPB        1 g 200 mL/hr over 30 Minutes Intravenous Every 12 hours 04/26/20 0812     04/26/20 0430  ceftazidime-avibactam (AVYCAZ) 0.94 g in dextrose 5 % 50 mL IVPB  Status:  Discontinued        0.94 g 25 mL/hr over 2 Hours Intravenous Every 24 hours 04/26/20 0342 04/26/20 0812   04/25/20 2200  cefTRIAXone (ROCEPHIN) 1 g in sodium chloride 0.9 % 100 mL IVPB  Status:  Discontinued        1 g 200 mL/hr over 30 Minutes Intravenous Every 24 hours 04/25/20 2109 04/26/20 0342   04/25/20 1215  piperacillin-tazobactam (ZOSYN) IVPB 3.375 g        3.375 g 100 mL/hr over 30 Minutes Intravenous  Once 04/25/20 1213 04/25/20 1359         Medications  Scheduled Meds: . aspirin EC  81 mg Oral Daily  .  Chlorhexidine Gluconate Cloth  6 each Topical Daily  . finasteride  5 mg Oral Daily  . QUEtiapine  12.5 mg Oral QHS  . tamsulosin  0.4 mg Oral BID  . zolpidem  5 mg Oral QHS   Continuous Infusions: . dextrose 100 mL/hr at 04/29/20 0225  . meropenem (MERREM) IV 1 g (04/29/20 0227)   PRN Meds:.acetaminophen **OR** acetaminophen, fentaNYL (SUBLIMAZE) injection, glycopyrrolate, hydrALAZINE, LORazepam      Subjective:   Christian Warren was seen and examined today.  Much more alert and awake today however does not follow commands, mumbling, difficult to understand.  No fevers or chills, active nausea or vomiting.  Does not appear to be in any pain   Objective:   Vitals:   04/28/20 0623 04/28/20 1053 04/28/20 2153 04/29/20 0605  BP: (!) 156/94 (!) 150/89 117/82 119/74  Pulse: 83 60 94 93  Resp: 17 18 16    Temp: 98 F (36.7 C) 98.1 F (36.7 C) (!) 97.4 F (36.3 C) (!) 96.9 F (36.1 C)  TempSrc: Oral Oral Axillary Axillary  SpO2:  100% 100% 100%  Weight:      Height:        Intake/Output Summary (Last 24 hours) at 04/29/2020 1217 Last data filed at 04/29/2020 0900 Gross per 24 hour  Intake 1712.03 ml  Output 1350 ml  Net 362.03 ml     Wt Readings from Last 3 Encounters:  04/26/20 87 kg  03/15/20 85 kg  03/10/20 84.6 kg   Physical Exam  General: Alert and awake, tracking, mumbling, does not follow commands  Cardiovascular: S1 S2 clear, RRR. No pedal edema b/l  Respiratory: CTAB, no wheezing, rales or rhonchi  Gastrointestinal: Soft, nontender, nondistended, NBS  Ext: no pedal edema bilaterally, abrasions on lower extremities  Neuro: no new deficits  Musculoskeletal: No cyanosis, clubbing  Skin: No rashes  Psych: dementia however much more alert and awake today   Data Reviewed:  I have personally reviewed following labs and imaging studies  Micro Results Recent Results (from the past 240 hour(s))  Urine culture     Status: Abnormal   Collection Time:  04/25/20 11:37 AM   Specimen: Urine, Clean Catch  Result Value Ref Range Status   Specimen Description   Final    URINE, CLEAN CATCH Performed at [facility], [address] [place], [place] [address]  Special Requests   Final    NONE Performed at [facility], [address], [place], [place] [address]    Culture 30,000 COLONIES/mL PSEUDOMONAS AERUGINOSA (A)  Final   Report Status 04/27/2020 FINAL  Final   Organism ID, Bacteria PSEUDOMONAS AERUGINOSA (A)  Final      Susceptibility   Pseudomonas aeruginosa - MIC*    CEFTAZIDIME 2 SENSITIVE Sensitive     CIPROFLOXACIN <=0.25 SENSITIVE Sensitive     GENTAMICIN 4 SENSITIVE Sensitive     IMIPENEM 2 SENSITIVE Sensitive     PIP/TAZO 8 SENSITIVE Sensitive     CEFEPIME 2 SENSITIVE Sensitive     * 30,000 COLONIES/mL PSEUDOMONAS AERUGINOSA  Culture, blood (routine x 2)     Status: None (Preliminary result)   Collection Time: 04/25/20 11:37 AM   Specimen: BLOOD  Result Value Ref Range Status   Specimen Description   Final    BLOOD LEFT ANTECUBITAL Performed at [facility], [address] [place], [place] [address]    Special Requests   Final    BOTTLES DRAWN AEROBIC AND ANAEROBIC Blood Culture adequate volume Performed at [facility], [address], [place], [place] [address]    Culture   Final    NO GROWTH 4 DAYS Performed at [facility], [address], [place], [place] [address]    Report Status PENDING  Incomplete  Culture, blood (routine x 2)     Status: Abnormal   Collection Time: 04/25/20 11:37 AM   Specimen: BLOOD  Result Value Ref Range Status   Specimen Description   Final    BLOOD BLOOD LEFT FOREARM Performed at [facility], [address] [place], [place] [address]    Special Requests   Final    BOTTLES DRAWN AEROBIC AND ANAEROBIC Blood Culture results may not be optimal due to an inadequate volume of blood  received in culture bottles Performed at [facility], [address] [place], [place] [address]    Culture  Setup Time   Final    IN BOTH AEROBIC AND ANAEROBIC BOTTLES GRAM NEGATIVE RODS Organism ID to follow CRITICAL RESULT CALLED TO, READ BACK BY AND VERIFIED WITHErling Cruz [facility] 04/26/20 0251 JDW Performed at [facility], [address], [place], [place] [address]    Culture (A)  Final    ESCHERICHIA COLI Confirmed Extended Spectrum Beta-Lactamase Producer (ESBL).  In bloodstream infections from ESBL organisms, carbapenems are preferred over piperacillin/tazobactam. They are shown to have a lower risk of mortality.    Report Status 04/28/2020 FINAL  Final   Organism ID, Bacteria ESCHERICHIA COLI  Final      Susceptibility   Escherichia coli - MIC*    AMPICILLIN >=32 RESISTANT Resistant     CEFAZOLIN >=64 RESISTANT Resistant     CEFEPIME 16 RESISTANT Resistant     CEFTAZIDIME RESISTANT Resistant     CEFTRIAXONE >=64 RESISTANT Resistant     CIPROFLOXACIN >=4 RESISTANT Resistant     GENTAMICIN >=16 RESISTANT Resistant     IMIPENEM <=0.25 SENSITIVE Sensitive     TRIMETH/SULFA <=20 SENSITIVE Sensitive     AMPICILLIN/SULBACTAM >=32 RESISTANT Resistant     PIP/TAZO 16 SENSITIVE Sensitive     * ESCHERICHIA COLI  Blood Culture ID Panel (Reflexed)     Status: Abnormal   Collection Time: 04/25/20 11:37 AM  Result Value Ref Range Status   Enterococcus faecalis NOT DETECTED NOT DETECTED Final  Enterococcus Faecium NOT DETECTED NOT DETECTED Final   Listeria monocytogenes NOT DETECTED NOT DETECTED Final   Staphylococcus species NOT DETECTED NOT DETECTED Final   Staphylococcus aureus (BCID) NOT DETECTED NOT DETECTED Final   Staphylococcus epidermidis NOT DETECTED NOT DETECTED Final   Staphylococcus lugdunensis NOT DETECTED NOT DETECTED Final   Streptococcus species NOT DETECTED NOT DETECTED Final   Streptococcus agalactiae NOT DETECTED NOT DETECTED Final    Streptococcus pneumoniae NOT DETECTED NOT DETECTED Final   Streptococcus pyogenes NOT DETECTED NOT DETECTED Final   A.calcoaceticus-baumannii NOT DETECTED NOT DETECTED Final   Bacteroides fragilis NOT DETECTED NOT DETECTED Final   Enterobacterales DETECTED (A) NOT DETECTED Final    Comment: Enterobacterales represent a large order of gram negative bacteria, not a single organism. CRITICAL RESULT CALLED TO, READ BACK BY AND VERIFIED WITH: E JACKSON PHARMD 04/26/20 0251 JDW    Enterobacter cloacae complex NOT DETECTED NOT DETECTED Final   Escherichia coli DETECTED (A) NOT DETECTED Final    Comment: CRITICAL RESULT CALLED TO, READ BACK BY AND VERIFIED WITH: E JACKSON PHARMD 04/26/20 0251 JDW    Klebsiella aerogenes NOT DETECTED NOT DETECTED Final   Klebsiella oxytoca NOT DETECTED NOT DETECTED Final   Klebsiella pneumoniae NOT DETECTED NOT DETECTED Final   Proteus species NOT DETECTED NOT DETECTED Final   Salmonella species NOT DETECTED NOT DETECTED Final   Serratia marcescens NOT DETECTED NOT DETECTED Final   Haemophilus influenzae NOT DETECTED NOT DETECTED Final   Neisseria meningitidis NOT DETECTED NOT DETECTED Final   Pseudomonas aeruginosa NOT DETECTED NOT DETECTED Final   Stenotrophomonas maltophilia NOT DETECTED NOT DETECTED Final   Candida albicans NOT DETECTED NOT DETECTED Final   Candida auris NOT DETECTED NOT DETECTED Final   Candida glabrata NOT DETECTED NOT DETECTED Final   Candida krusei NOT DETECTED NOT DETECTED Final   Candida parapsilosis NOT DETECTED NOT DETECTED Final   Candida tropicalis NOT DETECTED NOT DETECTED Final   Cryptococcus neoformans/gattii NOT DETECTED NOT DETECTED Final   CTX-M ESBL DETECTED (A) NOT DETECTED Final    Comment: CRITICAL RESULT CALLED TO, READ BACK BY AND VERIFIED WITH: E JACKSON PHARMD 04/26/20 0251 JDW (NOTE) Extended spectrum beta-lactamase detected. Recommend a carbapenem as initial therapy.      Carbapenem resistance IMP NOT  DETECTED NOT DETECTED Final   Carbapenem resistance KPC NOT DETECTED NOT DETECTED Final   Carbapenem resistance NDM NOT DETECTED NOT DETECTED Final   Carbapenem resist OXA 48 LIKE NOT DETECTED NOT DETECTED Final   Carbapenem resistance VIM NOT DETECTED NOT DETECTED Final    Comment: Performed at [facility], [address] [place], [place] [address]  Respiratory Panel by RT PCR (Flu A&B, Covid) - Urine, Catheterized     Status: None   Collection Time: 04/25/20 12:14 PM   Specimen: Urine, Catheterized; Nasopharyngeal  Result Value Ref Range Status   SARS Coronavirus 2 by RT PCR NEGATIVE NEGATIVE Final    Comment: (NOTE) SARS-CoV-2 target nucleic acids are NOT DETECTED.  The SARS-CoV-2 RNA is generally detectable in upper respiratoy specimens during the acute phase of infection. The lowest concentration of SARS-CoV-2 viral copies this assay can detect is 131 copies/mL. A negative result does not preclude SARS-Cov-2 infection and should not be used as the sole basis for treatment or other patient management decisions. A negative result may occur with  improper specimen collection/handling, submission of specimen other than nasopharyngeal swab, presence of viral mutation(s) within the areas targeted by this assay, and inadequate  number of viral copies (<131 copies/mL). A negative result must be combined with clinical observations, patient history, and epidemiological information. The expected result is Negative.  Fact Sheet for Patients:  https://www.moore.com/  Fact Sheet for Healthcare Providers:  https://www.young.biz/  This test is no t yet approved or cleared by the [place] FDA and  has been authorized for detection and/or diagnosis of SARS-CoV-2 by FDA under an Emergency Use Authorization (EUA). This EUA will remain  in effect (meaning this test can be used) for the duration of the COVID-19 declaration under Section 564(b)(1)  of the Act, 21 U.S.C. section 360bbb-3(b)(1), unless the authorization is terminated or revoked sooner.     Influenza A by PCR NEGATIVE NEGATIVE Final   Influenza B by PCR NEGATIVE NEGATIVE Final    Comment: (NOTE) The Xpert Xpress SARS-CoV-2/FLU/RSV assay is intended as an aid in  the diagnosis of influenza from Nasopharyngeal swab specimens and  should not be used as a sole basis for treatment. Nasal washings and  aspirates are unacceptable for Xpert Xpress SARS-CoV-2/FLU/RSV  testing.  Fact Sheet for Patients: https://www.moore.com/  Fact Sheet for Healthcare Providers: https://www.young.biz/  This test is not yet approved or cleared by the [place] FDA and  has been authorized for detection and/or diagnosis of SARS-CoV-2 by  FDA under an Emergency Use Authorization (EUA). This EUA will remain  in effect (meaning this test can be used) for the duration of the  Covid-19 declaration under Section 564(b)(1) of the Act, 21  U.S.C. section 360bbb-3(b)(1), unless the authorization is  terminated or revoked. Performed at [facility], [address], [place], [place] [address]     Radiology Reports DG Abdomen 1 View  Result Date: 04/25/2020 CLINICAL DATA:  Abdominal pain EXAM: ABDOMEN - 1 VIEW COMPARISON:  CT 01/27/2020 FINDINGS: Nonobstructive bowel gas pattern. No organomegaly or free air. Rounded calcified structures in the pelvis measuring up to 1.7 cm. These correspond to the bladder stones seen on prior CT. Vascular calcifications. Diffuse degenerative changes in the lumbar spine. No acute bony abnormality. IMPRESSION: Bladder stones again noted as seen on prior CT. No bowel obstruction. No acute findings. Electronically Signed   By: Charlett Nose M.D.   On: 04/25/2020 18:37   CT Head Wo Contrast  Result Date: 04/25/2020 CLINICAL DATA:  Mental status change. Nonverbal. Lethargy. Renal insufficiency. EXAM: CT HEAD  WITHOUT CONTRAST TECHNIQUE: Contiguous axial images were obtained from the base of the skull through the vertex without intravenous contrast. COMPARISON:  03/03/2020 FINDINGS: Brain: Advanced cerebral/cerebellar atrophy. Marked low density in the periventricular white matter likely related to small vessel disease. No mass lesion, hemorrhage, hydrocephalus, acute infarct, intra-axial, or extra-axial fluid collection. Vascular: Intracranial atherosclerosis. Skull: Motion degradation.  No significant soft tissue swelling. Sinuses/Orbits: Normal imaged portions of the orbits and globes. Clear paranasal sinuses and mastoid air cells. Other: None. IMPRESSION: 1. Motion degradation. 2. No acute intracranial abnormality. 3. Cerebral atrophy and small vessel ischemic change. Electronically Signed   By: Jeronimo Greaves M.D.   On: 04/25/2020 14:30   US RENAL  Result Date: 04/25/2020 CLINICAL DATA:  Acute kidney injury EXAM: RENAL / URINARY TRACT ULTRASOUND COMPLETE COMPARISON:  None. FINDINGS: Right Kidney: Renal measurements: 9.4 x 4.3 x 4.6 cm = volume: 96.8 mL. Echogenicity within normal limits. Mild pelvicaliectasis is seen. No shadowing stones are noted. Left Kidney: Renal measurements: 11.1 x 5.8 x 5.7 cm = volume: 192 mL. Echogenicity within normal limits. Mild pelvicaliectasis is seen. No shadowing  stones are noted. Bladder: Foley catheter is noted. Other: None. IMPRESSION: Mild bilateral hydronephrosis.  No shadowing stones. Electronically Signed   By: Jonna Clark M.D.   On: 04/25/2020 20:47   DG Chest Port 1 View  Result Date: 04/25/2020 CLINICAL DATA:  Weakness. EXAM: PORTABLE CHEST 1 VIEW COMPARISON:  Chest radiograph 03/03/2020 and earlier. Chest CT 02/04/2020. FINDINGS: Shallow inspiration radiograph. Heart size at the upper limits of normal, unchanged. Aortic atherosclerosis. No appreciable airspace consolidation or pulmonary edema. No evidence of pleural effusion or pneumothorax. No acute bony  abnormality identified. IMPRESSION: Shallow inspiration radiograph. No evidence of acute cardiopulmonary abnormality. Aortic Atherosclerosis (ICD10-I70.0). Electronically Signed   By: Jackey Loge DO   On: 04/25/2020 10:49    Lab Data:  CBC: Recent Labs  Lab 04/25/20 1137 04/26/20 0346 04/27/20 0358 04/28/20 0412 04/29/20 0318  WBC 11.6* 9.1 7.5 9.1 7.8  NEUTROABS 8.6*  --  4.7 5.6 4.5  HGB 10.6* 9.4* 9.4* 10.2* 8.5*  HCT 33.7* 29.8* 31.5* 32.8* 28.0*  MCV 96.3 95.5 98.4 95.9 96.9  PLT 346 299 312 346 292   Basic Metabolic Panel: Recent Labs  Lab 04/25/20 1137 04/26/20 0346 04/27/20 0358 04/28/20 0412 04/29/20 0318  NA 150* 154* 147* 146* 142  K 3.7 4.3 3.4* 3.2* 4.1  CL 115* 121* 119* 116* 115*  CO2 20* 17* 19* 20* 21*  GLUCOSE 133* 113* 119* 128* 107*  BUN 92* 81* 64* 59* 60*  CREATININE 4.16* 3.52* 2.81* 2.36* 2.59*  CALCIUM 10.5* 10.0 9.9 10.0 9.3  MG  --   --  2.2  --   --   PHOS  --   --  2.8  --   --    GFR: Estimated Creatinine Clearance: 23.3 mL/min (A) (by C-G formula based on SCr of 2.59 mg/dL (H)). Liver Function Tests: Recent Labs  Lab 04/25/20 1137 04/26/20 0346 04/27/20 0358 04/28/20 0412 04/29/20 0318  AST ALT ALKPHOS 91 77 77 75 65  BILITOT 0.6 0.6 0.5 0.6 0.5  PROT 7.4 6.6 6.4* 6.0* 5.3*  ALBUMIN 3.4* 3.1* 3.0* 3.0* 2.5*   Recent Labs  Lab 04/25/20 1137  LIPASE 23   No results for input(s): AMMONIA in the last 168 hours. Coagulation Profile: Recent Labs  Lab 04/25/20 1137  INR 1.2   Cardiac Enzymes: No results for input(s): CKTOTAL, CKMB, CKMBINDEX, TROPONINI in the last 168 hours. BNP (last 3 results) No results for input(s): PROBNP in the last 8760 hours. HbA1C: No results for input(s): HGBA1C in the last 72 hours. CBG: No results for input(s): GLUCAP in the last 168 hours. Lipid Profile: No results for input(s): CHOL, HDL, LDLCALC, TRIG, CHOLHDL, LDLDIRECT in the last 72 hours. Thyroid  Function Tests: No results for input(s): TSH, T4TOTAL, FREET4, T3FREE, THYROIDAB in the last 72 hours. Anemia Panel: No results for input(s): VITAMINB12, FOLATE, FERRITIN, TIBC, IRON, RETICCTPCT in the last 72 hours. Urine analysis:    Component Value Date/Time   COLORURINE YELLOW 04/25/2020 1137   APPEARANCEUR TURBID (A) 04/25/2020 1137   LABSPEC 1.011 04/25/2020 1137   PHURINE 6.0 04/25/2020 1137   GLUCOSEU NEGATIVE 04/25/2020 1137   HGBUR SMALL (A) 04/25/2020 1137   BILIRUBINUR NEGATIVE 04/25/2020 1137   KETONESUR NEGATIVE 04/25/2020 1137   PROTEINUR 100 (A) 04/25/2020 1137   NITRITE NEGATIVE 04/25/2020 1137   LEUKOCYTESUR LARGE (A) 04/25/2020 1137     Siaosi Alter M.D. [facility] 04/29/2020, 12:17  PM   Call night coverage person covering after 7pm

## 2020-04-30 DIAGNOSIS — F0391 Unspecified dementia with behavioral disturbance: Secondary | ICD-10-CM

## 2020-04-30 DIAGNOSIS — N179 Acute kidney failure, unspecified: Secondary | ICD-10-CM | POA: Diagnosis not present

## 2020-04-30 DIAGNOSIS — R7881 Bacteremia: Secondary | ICD-10-CM | POA: Diagnosis not present

## 2020-04-30 DIAGNOSIS — R400 Somnolence: Secondary | ICD-10-CM | POA: Diagnosis not present

## 2020-04-30 DIAGNOSIS — E870 Hyperosmolality and hypernatremia: Secondary | ICD-10-CM | POA: Diagnosis not present

## 2020-04-30 LAB — CULTURE, BLOOD (ROUTINE X 2)
Culture: NO GROWTH
Special Requests: ADEQUATE

## 2020-04-30 LAB — CBC WITH DIFFERENTIAL/PLATELET
Abs Immature Granulocytes: 0.82 10*3/uL — ABNORMAL HIGH (ref 0.00–0.07)
Basophils Absolute: 0.1 10*3/uL (ref 0.0–0.1)
Basophils Relative: 1 %
Eosinophils Absolute: 0.3 10*3/uL (ref 0.0–0.5)
Eosinophils Relative: 3 %
HCT: 29.9 % — ABNORMAL LOW (ref 39.0–52.0)
Hemoglobin: 9.1 g/dL — ABNORMAL LOW (ref 13.0–17.0)
Immature Granulocytes: 10 %
Lymphocytes Relative: 20 %
Lymphs Abs: 1.6 10*3/uL (ref 0.7–4.0)
MCH: 29.9 pg (ref 26.0–34.0)
MCHC: 30.4 g/dL (ref 30.0–36.0)
MCV: 98.4 fL (ref 80.0–100.0)
Monocytes Absolute: 0.9 10*3/uL (ref 0.1–1.0)
Monocytes Relative: 12 %
Neutro Abs: 4.5 10*3/uL (ref 1.7–7.7)
Neutrophils Relative %: 54 %
Platelets: 193 10*3/uL (ref 150–400)
RBC: 3.04 MIL/uL — ABNORMAL LOW (ref 4.22–5.81)
RDW: 15.4 % (ref 11.5–15.5)
WBC: 8.2 10*3/uL (ref 4.0–10.5)
nRBC: 0 % (ref 0.0–0.2)

## 2020-04-30 NOTE — Plan of Care (Signed)

## 2020-04-30 NOTE — Progress Notes (Addendum)
Speech Language Pathology Treatment: Dysphagia  Patient Details Name: Christian Warren. MRN: [id number] DOB: [birth year] Today's Date: 04/30/2020 Time: [id number] SLP Time Calculation (min) (ACUTE ONLY): 21 min  Assessment / Plan / Recommendation Clinical Impression  Pt did not elicit swallow with all boluses attempted including tsp nor tip of straw boluses of jello, soda and water.  Excessive oral holding with wet voice phonation noted without pharyngeal swallow triggered- SlP Orally suctioned pt after no swallow was produced with removal of secretions from oral cavity.  He is slightly resistant to oral suction but when able to obtain, it was effective to clear thin secretions - suspect he is not swallowing saliva resulting in oral holding/pooling.    Multiple different boluses as well as compensations including dry spoon, verbal/ctactile cues trialed to elicit swallow to no avail.  In addition, soda was provided for improved sensory input given his dementia but without effectiveness.    Pt tends to be resistant to po intake, pushing SLP away with his arms with all po trials, even with attempts at hand over hand assistance with mitt off.  At this time, this pt will not consume adequate po intake given his resistance to all po and he also appears to be aspirating his secretions c/b wet phonation/breathing.    Note plans for potential hospice -thus will follow up if family around to discuss comfort po intake including even using swabs with flavored drink for pt's comfort.  He unfortuantely has not demonstrated significant improvement since evaluation four days prior.   Given pt will likely not swallow po medications, would advise to discontinue all that are not for comfort. SLP reviewed findings/recommendations with RN.  Left music on for pt from the 50s in hopes to help soothe him.      HPI HPI: Per admission note, "Christian Warren. is a 86 y.o. male with medical history significant of dementia, HTN,  BPH, chronic indwelling foley. Pt is currently altered. Hx is from EDP report and chart review. Patient has been more altered at his facility over the last few days. He is normally able to talk, ambulate with walker, and feed himself. Over the last few days, he has become increasing lethargic with intermittent complaints of abdominal pain. EMS was called today and he was found to be tachycardic and confused. He was [facility] to [facility]  ED Course: ED w/u revealed hypernatremia, AKI, and a possible UTI. He was given fluids and started on zosyn. His chronic indwelling foley was changed." CT head negative, CXR negative.  Pt underwent BSE 11/18 and recommendation was for npo except needed medications crushed with puree and water via tsp.  Per notes, pt more alert since eval and re-eval ordered.  Pt has been declining po intake.      SLP Plan  Continue with current plan of care       Recommendations  Diet recommendations: NPO;Other(comment) (oral moisture, water via tsp, tip of straw - oral suction if pt does not swallow) Medication Administration: Crushed with puree                Oral Care Recommendations: Oral care QID Follow up Recommendations: None SLP Visit Diagnosis: Dysphagia, oropharyngeal phase (R13.12);Dysphagia, unspecified (R13.10) Plan: Continue with current plan of care       GO                Christian Warren 04/30/2020, 2:41 PM  Christian Infante, MS [facility] SLP Acute Rehab Services Office [phone] Pager [phone]

## 2020-04-30 NOTE — TOC Progression Note (Signed)
Transition of Care (TOC) - Progression Note    Patient Details  Name: Christian Warren. MRN: [id number] Date of Birth: [birth year]  Transition of Care [facility]) CM/SW Contact  Clearance Coots, LCSW Phone Number: 04/30/2020, 2:32 PM  Clinical Narrative:    Patient family request residential [facility] services. CSW made a referral to staff Mechele Collin.   Adult Protective Service case worker Towana Badger made an in person visit today.She request to know the patient disposition. CSW left a voicemail for a return call.    Expected Discharge Plan: Hospice Medical Facility Barriers to Discharge: Continued Medical Work up  Expected Discharge Plan and Services Expected Discharge Plan: Hospice Medical Facility     Post Acute Care Choice: Skilled Nursing Facility Living arrangements for the past 2 months: Skilled Nursing Facility                                       Social Determinants of Health (SDOH) Interventions    Readmission Risk Interventions No flowsheet data found.

## 2020-04-30 NOTE — Discharge Summary (Signed)
Physician Discharge Summary   Patient ID: Christian GentileJohn Gow Jr. MRN: [id number] DOB/AGE: 1932-11-20 86 y.o.  Admit date: 04/25/2020 Discharge date: 04/30/2020  Primary Care Physician:  Retia Warren, Christian S, NP   Recommendations for Outpatient Follow-up:  1. Patient accepted to residential hospice   Home Health: hospice  Equipment/Devices:   Discharge Condition: guarded, overall poor prognosis CODE STATUS: DNR  Diet recommendation: comfort care   Discharge Diagnoses:    Sepsis, present on admission  . pseudomonas UTI (urinary tract infection) . Bacteremia due to ESBL Escherichia coli . AKI (acute kidney injury) (HCC) . Altered mental status . CKD (chronic kidney disease) stage 3a , GFR 30-59 ml/min (HCC) . Essential hypertension . chronic Urinary retention with indwelling foley cath  . Severe dehydration with hyperchloremic hypernatremia  hypokalemia Dementia with failure to thrive  dysphagia with aspiration  DNR    Consults:   Palliative medicine     Allergies:   Allergies  Allergen Reactions  . Orange Juice [Orange Oil] Itching     DISCHARGE MEDICATIONS: Allergies as of 04/30/2020      Reactions   Orange Juice [orange Oil] Itching      Medication List    STOP taking these medications   ALPRAZolam 1 MG tablet Commonly known as: XANAX   amLODipine 10 MG tablet Commonly known as: NORVASC   feeding supplement Liqd   omeprazole 20 MG capsule Commonly known as: PRILOSEC   OVER THE COUNTER MEDICATION   polyethylene glycol 17 g packet Commonly known as: MIRALAX / GLYCOLAX     TAKE these medications   acetaminophen 325 MG tablet Commonly known as: TYLENOL Take 650 mg by mouth every 6 (six) hours as needed for mild pain, fever or headache.   aspirin EC 81 MG tablet Take 81 mg by mouth daily.   finasteride 5 MG tablet Commonly known as: PROSCAR Take 1 tablet (5 mg total) by mouth daily.   QUEtiapine 25 MG tablet Commonly known as: SEROQUEL Take  0.5 tablets (12.5 mg total) by mouth at bedtime.   tamsulosin 0.4 MG Caps capsule Commonly known as: FLOMAX Take 0.4 mg by mouth 2 (two) times daily.   zolpidem 5 MG tablet Commonly known as: AMBIEN Take 5 mg by mouth at bedtime.        Brief H and P: For complete details please refer to admission H and P, but in brief Patient is a 86 year old male with history of dementia, hypertension, BPH, urinary obstruction the status post chronic indwelling Foley catheter and currently long-term nursing home resident brought to the ER with altered mental status for the last few days. Reportedly, at baseline he is able to talk some and ambulates with a walker. Over last few days he has been lethargic and complaining of abdominal pain. In the emergency room, he was found to be tachycardic, confused. He had elevated sodium level. Acute kidney injury. Urine consistent with UTI. Blood cultures urine cultures drawn and admitted.   Hospital Course:   Bacteremia due to ESBL Escherichia coli,  sepsis present on admission, pseudomonas UTI in the setting of chronic indwelling Foley catheter -Foley catheter was exchanged - blood cultures positive for ESBL E. coli, patient was placed on IV meropenem. -Urine culture showed 30,000 colonies of Pseudomonas, sensitive to imipenem - now comfort care as per family's wishes, poor prognosis, end stage dementia, with FTT, poor PO intake, dysphagia with high aspiration risk.    Dementia with acute metabolic encephalopathy, sundowning - Likely worsened due to #1,  remains frail and debilitated, no adequate oral intake, high risk for aspiration per swallow study   Essential hypertension BP stable  Acute kidney injury superimposed on underlying CKD stage IIIa -Worsened due to dehydration, sepsis -Creatinine 3.52 at the time of admission, poor p.o. intake - cr 2.59 at discharge   Severe dehydration/hyperchloremic hypernatremia Sodium 154 at the time of  admission, now improving, patient was placed on D5 drip N.p.o. due to high risk aspiration on swallow study, palliative medicine was consulted for goals of care, inadequate p.o. intake, advanced dementia   Hypokalemia Resolved  Chronic urinary obstruction with indwelling Foley catheter -On Flomax and Proscar, Foley catheter exchanged    Day of Discharge S: Somnolent, no acute issues, no fevers or pain, vomiting   BP (!) 127/45 (BP Location: Right Arm)   Pulse 69   Temp 98 F (36.7 C) (Axillary)   Resp 18   Ht 6' (1.829 m)   Wt 87 kg   SpO2 100%   BMI 26.01 kg/m   Physical Exam: General: somnolent, not in any acute distress. HEENT: anicteric sclera, pupils reactive to light and accommodation CVS: S1-S2 clear no murmur rubs or gallops Chest: clear to auscultation bilaterally, no wheezing rales or rhonchi Abdomen: soft nontender, nondistended, normal bowel sounds Extremities: no cyanosis, clubbing or edema noted bilaterally    Get Medicines reviewed and adjusted: Please take all your medications with you for your next visit with your Primary MD  Please request your Primary MD to go over all hospital tests and procedure/radiological results at the follow up. Please ask your Primary MD to get all Hospital records sent to his/her office.  If you experience worsening of your admission symptoms, develop shortness of breath, life threatening emergency, suicidal or homicidal thoughts you must seek medical attention immediately by calling 911 or calling your MD immediately  if symptoms less severe.  You must read complete instructions/literature along with all the possible adverse reactions/side effects for all the Medicines you take and that have been prescribed to you. Take any new Medicines after you have completely understood and accept all the possible adverse reactions/side effects.   Do not drive when taking pain medications.   Do not take more than prescribed Pain,  Sleep and Anxiety Medications  Special Instructions: If you have smoked or chewed Tobacco  in the last 2 yrs please stop smoking, stop any regular Alcohol  and or any Recreational drug use.  Wear Seat belts while driving.  Please note  You were cared for by a hospitalist during your hospital stay. Once you are discharged, your primary care physician will handle any further medical issues. Please note that NO REFILLS for any discharge medications will be authorized once you are discharged, as it is imperative that you return to your primary care physician (or establish a relationship with a primary care physician if you do not have one) for your aftercare needs so that they can reassess your need for medications and monitor your lab values.   The results of significant diagnostics from this hospitalization (including imaging, microbiology, ancillary and laboratory) are listed below for reference.      Procedures/Studies:  DG Abdomen 1 View  Result Date: 04/25/2020 CLINICAL DATA:  Abdominal pain EXAM: ABDOMEN - 1 VIEW COMPARISON:  CT 01/27/2020 FINDINGS: Nonobstructive bowel gas pattern. No organomegaly or free air. Rounded calcified structures in the pelvis measuring up to 1.7 cm. These correspond to the bladder stones seen on prior CT. Vascular calcifications. Diffuse degenerative  changes in the lumbar spine. No acute bony abnormality. IMPRESSION: Bladder stones again noted as seen on prior CT. No bowel obstruction. No acute findings. Electronically Signed   By: Charlett Nose M.D.   On: 04/25/2020 18:37   CT Head Wo Contrast  Result Date: 04/25/2020 CLINICAL DATA:  Mental status change. Nonverbal. Lethargy. Renal insufficiency. EXAM: CT HEAD WITHOUT CONTRAST TECHNIQUE: Contiguous axial images were obtained from the base of the skull through the vertex without intravenous contrast. COMPARISON:  03/03/2020 FINDINGS: Brain: Advanced cerebral/cerebellar atrophy. Marked low density in the  periventricular white matter likely related to small vessel disease. No mass lesion, hemorrhage, hydrocephalus, acute infarct, intra-axial, or extra-axial fluid collection. Vascular: Intracranial atherosclerosis. Skull: Motion degradation.  No significant soft tissue swelling. Sinuses/Orbits: Normal imaged portions of the orbits and globes. Clear paranasal sinuses and mastoid air cells. Other: None. IMPRESSION: 1. Motion degradation. 2. No acute intracranial abnormality. 3. Cerebral atrophy and small vessel ischemic change. Electronically Signed   By: Jeronimo Greaves M.D.   On: 04/25/2020 14:30   US RENAL  Result Date: 04/25/2020 CLINICAL DATA:  Acute kidney injury EXAM: RENAL / URINARY TRACT ULTRASOUND COMPLETE COMPARISON:  None. FINDINGS: Right Kidney: Renal measurements: 9.4 x 4.3 x 4.6 cm = volume: 96.8 mL. Echogenicity within normal limits. Mild pelvicaliectasis is seen. No shadowing stones are noted. Left Kidney: Renal measurements: 11.1 x 5.8 x 5.7 cm = volume: 192 mL. Echogenicity within normal limits. Mild pelvicaliectasis is seen. No shadowing stones are noted. Bladder: Foley catheter is noted. Other: None. IMPRESSION: Mild bilateral hydronephrosis.  No shadowing stones. Electronically Signed   By: Jonna Clark M.D.   On: 04/25/2020 20:47   DG Chest Port 1 View  Result Date: 04/25/2020 CLINICAL DATA:  Weakness. EXAM: PORTABLE CHEST 1 VIEW COMPARISON:  Chest radiograph 03/03/2020 and earlier. Chest CT 02/04/2020. FINDINGS: Shallow inspiration radiograph. Heart size at the upper limits of normal, unchanged. Aortic atherosclerosis. No appreciable airspace consolidation or pulmonary edema. No evidence of pleural effusion or pneumothorax. No acute bony abnormality identified. IMPRESSION: Shallow inspiration radiograph. No evidence of acute cardiopulmonary abnormality. Aortic Atherosclerosis (ICD10-I70.0). Electronically Signed   By: Jackey Loge DO   On: 04/25/2020 10:49       LAB RESULTS: Basic  Metabolic Panel: Recent Labs  Lab 04/27/20 0358 04/27/20 0358 04/28/20 0412 04/29/20 0318  NA 147*   < > 146* 142  K 3.4*   < > 3.2* 4.1  CL 119*   < > 116* 115*  CO2 19*   < > 20* 21*  GLUCOSE 119*   < > 128* 107*  BUN 64*   < > 59* 60*  CREATININE 2.81*   < > 2.36* 2.59*  CALCIUM 9.9   < > 10.0 9.3  MG 2.2  --   --   --   PHOS 2.8  --   --   --    < > = values in this interval not displayed.   Liver Function Tests: Recent Labs  Lab 04/28/20 0412 04/29/20 0318  AST 23 26  ALT 17 16  ALKPHOS 75 65  BILITOT 0.6 0.5  PROT 6.0* 5.3*  ALBUMIN 3.0* 2.5*   Recent Labs  Lab 04/25/20 1137  LIPASE 23   No results for input(s): AMMONIA in the last 168 hours. CBC: Recent Labs  Lab 04/29/20 0318 04/29/20 0318 04/30/20 0318  WBC 7.8  --  8.2  NEUTROABS 4.5   < > 4.5  HGB 8.5*  --  9.1*  HCT 28.0*  --  29.9*  MCV 96.9   < > 98.4  PLT 292  --  193   < > = values in this interval not displayed.   Cardiac Enzymes: No results for input(s): CKTOTAL, CKMB, CKMBINDEX, TROPONINI in the last 168 hours. BNP: Invalid input(s): POCBNP CBG: No results for input(s): GLUCAP in the last 168 hours.     Disposition and Follow-up:    DISPOSITION: hospice    DISCHARGE FOLLOW-UP  Follow-up Information    Retia Passe, NP Follow up.   Specialty: Nurse Practitioner Why: as needed  Contact information: [address] [place] [address] [phone]                Time coordinating discharge:    Signed:   Thad Ranger M.D. [facility] 04/30/2020, 3:19 PM

## 2020-04-30 NOTE — Progress Notes (Addendum)
[facility]                                                                              Patient Demographics  Christian Warren, is a 86 y.o. male, DOB - 1932-07-03, WJX:[id number]  Admit date - 04/25/2020   Admitting Physician Teddy Spike, DO  Outpatient Primary MD for the patient is Retia Passe, NP  Outpatient specialists:   LOS - 5  days   Medical records reviewed and are as summarized below:    Chief Complaint  Patient presents with  . Abdominal Pain  . Altered Mental Status       Brief summary  Patient is a 86 year old male with history of dementia, hypertension, BPH, urinary obstruction the status post chronic indwelling Foley catheter and currently long-term nursing home resident brought to the ER with altered mental status for the last few days.  Reportedly, at baseline he is able to talk some and ambulates with a walker.  Over last few days he has been lethargic and complaining of abdominal pain.  In the emergency room, he was found to be tachycardic, confused.  He had elevated sodium level.  Acute kidney injury.  Urine consistent with UTI.  Blood cultures urine cultures drawn and admitted.   Assessment & Plan    Principal Problem:   Bacteremia due to Escherichia coli,  sepsis present on admission, E. coli UTI in the setting of chronic indwelling Foley catheter -Foley catheter was exchanged - blood cultures positive for ESBL E. coli, continue meropenem -Urine culture showed 30,000 colonies of Pseudomonas, sensitive to imipenem  Active Problems: Dementia with acute metabolic encephalopathy, sundowning - Likely worsened due to #1, remains frail and debilitated, no adequate oral intake, high risk for aspiration per swallow study -Awaiting family's decision regarding hospice facility, comfort care  Essential hypertension BP stable  Acute kidney injury superimposed on underlying CKD stage IIIa -Worsened due to dehydration, sepsis -Creatinine 3.52  at the time of admission, poor p.o. intake  Severe dehydration/hyperchloremic hypernatremia Sodium 154 at the time of admission, now improving, continue D5 N.p.o. due to high risk aspiration, palliative medicine consulted for goals of care, inadequate p.o. intake, advanced dementia -Sodium improving, obtain BMET in a.m. -Poor p.o. intake, SLP evaluation, to assess if the diet can be advanced  Hypokalemia Resolved  Chronic urinary obstruction with indwelling Foley catheter -On Flomax and Proscar, Foley catheter exchanged   Code Status: DNR DVT Prophylaxis: SCDs Family Communication: No family at the bedside   Disposition Plan:     Status is: Inpatient  Remains inpatient appropriate because:Inpatient level of care appropriate due to severity of illness   Dispo: The patient is from: SNF              Anticipated d/c is to: TBD              Anticipated d/c date is: 2 days              Patient currently is not medically stable to d/c.  poor prognosis, advanced dementia, inadequate p.o. intake, admitted with sepsis, UTI, altered mental status      Time  Spent in minutes   25 minutes  Procedures:  None  Consultants:   Palliative medicine  Antimicrobials:   Anti-infectives (From admission, onward)   Start     Dose/Rate Route Frequency Ordered Stop   04/26/20 1400  meropenem (MERREM) 1 g in sodium chloride 0.9 % 100 mL IVPB        1 g 200 mL/hr over 30 Minutes Intravenous Every 12 hours 04/26/20 0812     04/26/20 0430  ceftazidime-avibactam (AVYCAZ) 0.94 g in dextrose 5 % 50 mL IVPB  Status:  Discontinued        0.94 g 25 mL/hr over 2 Hours Intravenous Every 24 hours 04/26/20 0342 04/26/20 0812   04/25/20 2200  cefTRIAXone (ROCEPHIN) 1 g in sodium chloride 0.9 % 100 mL IVPB  Status:  Discontinued        1 g 200 mL/hr over 30 Minutes Intravenous Every 24 hours 04/25/20 2109 04/26/20 0342   04/25/20 1215  piperacillin-tazobactam (ZOSYN) IVPB 3.375 g        3.375 g 100  mL/hr over 30 Minutes Intravenous  Once 04/25/20 1213 04/25/20 1359         Medications  Scheduled Meds: . aspirin EC  81 mg Oral Daily  . Chlorhexidine Gluconate Cloth  6 each Topical Daily  . finasteride  5 mg Oral Daily  . QUEtiapine  12.5 mg Oral QHS  . tamsulosin  0.4 mg Oral BID  . zolpidem  5 mg Oral QHS   Continuous Infusions: . dextrose 100 mL/hr at 04/30/20 0200  . meropenem (MERREM) IV Stopped (04/30/20 0139)   PRN Meds:.acetaminophen **OR** acetaminophen, fentaNYL (SUBLIMAZE) injection, glycopyrrolate, hydrALAZINE, LORazepam      Subjective:   Tracy Kinner was seen and examined today.  Does not follow commands, difficult to understand.  Overnight received Ativan, was somewhat somnolent this morning.    No fevers or chills, active nausea or vomiting.  Does not appear to be in any pain   Objective:   Vitals:   04/28/20 2153 04/29/20 0605 04/29/20 2137 04/30/20 0502  BP: 117/82 119/74 121/79 (!) 140/96  Pulse: 94 93 76 90  Resp: 16  18 20   Temp: (!) 97.4 F (36.3 C) (!) 96.9 F (36.1 C) (!) 97.5 F (36.4 C) (!) 97.5 F (36.4 C)  TempSrc: Axillary Axillary Oral Oral  SpO2: 100% 100% 100% 100%  Weight:      Height:        Intake/Output Summary (Last 24 hours) at 04/30/2020 1220 Last data filed at 04/30/2020 1000 Gross per 24 hour  Intake 2447.66 ml  Output 1200 ml  Net 1247.66 ml     Wt Readings from Last 3 Encounters:  04/26/20 87 kg  03/15/20 85 kg  03/10/20 84.6 kg   Physical Exam  General: Somnolent, does not follow commands, mumbling  Cardiovascular: S1 S2 clear, RRR. No pedal edema b/l  Respiratory: CTAB, no wheezing, rales or rhonchi  Gastrointestinal: Soft, nontender, nondistended, NBS  Ext: no pedal edema bilaterally, abrasions on the lower extremity  Neuro: moving extremities spontaneously but does not follow commands  Musculoskeletal: No cyanosis, clubbing  Skin: No rashes  Psych: dementia    Data Reviewed:  I have  personally reviewed following labs and imaging studies  Micro Results Recent Results (from the past 240 hour(s))  Urine culture     Status: Abnormal   Collection Time: 04/25/20 11:37 AM   Specimen: Urine, Clean Catch  Result Value Ref Range Status   Specimen  Description   Final    URINE, CLEAN CATCH Performed at [facility], [address] [place], [place] [address]    Special Requests   Final    NONE Performed at [facility], [address] [place], [place] [address]    Culture 30,000 COLONIES/mL PSEUDOMONAS AERUGINOSA (A)  Final   Report Status 04/27/2020 FINAL  Final   Organism ID, Bacteria PSEUDOMONAS AERUGINOSA (A)  Final      Susceptibility   Pseudomonas aeruginosa - MIC*    CEFTAZIDIME 2 SENSITIVE Sensitive     CIPROFLOXACIN <=0.25 SENSITIVE Sensitive     GENTAMICIN 4 SENSITIVE Sensitive     IMIPENEM 2 SENSITIVE Sensitive     PIP/TAZO 8 SENSITIVE Sensitive     CEFEPIME 2 SENSITIVE Sensitive     * 30,000 COLONIES/mL PSEUDOMONAS AERUGINOSA  Culture, blood (routine x 2)     Status: None   Collection Time: 04/25/20 11:37 AM   Specimen: BLOOD  Result Value Ref Range Status   Specimen Description   Final    BLOOD LEFT ANTECUBITAL Performed at [facility], [address] [place], [place] [address]    Special Requests   Final    BOTTLES DRAWN AEROBIC AND ANAEROBIC Blood Culture adequate volume Performed at [facility], [address] [place], [place] [address]    Culture   Final    NO GROWTH 5 DAYS Performed at [facility], [address] [place], [place] [address]    Report Status 04/30/2020 FINAL  Final  Culture, blood (routine x 2)     Status: Abnormal   Collection Time: 04/25/20 11:37 AM   Specimen: BLOOD  Result Value Ref Range Status   Specimen Description   Final    BLOOD BLOOD LEFT FOREARM Performed at [facility], [address] [place] [address]    Special Requests   Final    BOTTLES DRAWN AEROBIC AND ANAEROBIC Blood Culture results may not be optimal due to an inadequate volume of blood received in culture bottles Performed at [facility], [address] [place], [place] [address]    Culture  Setup Time   Final    IN BOTH AEROBIC AND ANAEROBIC BOTTLES GRAM NEGATIVE RODS Organism ID to follow CRITICAL RESULT CALLED TO, READ BACK BY AND VERIFIED WITHErling Cruz: E JACKSON [facility] 04/26/20 0251 JDW Performed at [facility], [address] [place], [place] [address]    Culture (A)  Final    ESCHERICHIA COLI Confirmed Extended Spectrum Beta-Lactamase Producer (ESBL).  In bloodstream infections from ESBL organisms, carbapenems are preferred over piperacillin/tazobactam. They are shown to have a lower risk of mortality.    Report Status 04/28/2020 FINAL  Final   Organism ID, Bacteria ESCHERICHIA COLI  Final      Susceptibility   Escherichia coli - MIC*    AMPICILLIN >=32 RESISTANT Resistant     CEFAZOLIN >=64 RESISTANT Resistant     CEFEPIME 16 RESISTANT Resistant     CEFTAZIDIME RESISTANT Resistant     CEFTRIAXONE >=64 RESISTANT Resistant     CIPROFLOXACIN >=4 RESISTANT Resistant     GENTAMICIN >=16 RESISTANT Resistant     IMIPENEM <=0.25 SENSITIVE Sensitive     TRIMETH/SULFA <=20 SENSITIVE Sensitive     AMPICILLIN/SULBACTAM >=32 RESISTANT Resistant     PIP/TAZO 16 SENSITIVE Sensitive     * ESCHERICHIA COLI  Blood Culture ID Panel (Reflexed)  Status: Abnormal   Collection Time: 04/25/20 11:37 AM  Result Value Ref Range Status   Enterococcus faecalis NOT DETECTED NOT DETECTED Final   Enterococcus Faecium NOT DETECTED NOT DETECTED Final   Listeria monocytogenes NOT DETECTED NOT DETECTED Final   Staphylococcus species NOT DETECTED NOT DETECTED Final   Staphylococcus aureus (BCID) NOT DETECTED NOT DETECTED Final   Staphylococcus epidermidis NOT DETECTED NOT DETECTED Final   Staphylococcus  lugdunensis NOT DETECTED NOT DETECTED Final   Streptococcus species NOT DETECTED NOT DETECTED Final   Streptococcus agalactiae NOT DETECTED NOT DETECTED Final   Streptococcus pneumoniae NOT DETECTED NOT DETECTED Final   Streptococcus pyogenes NOT DETECTED NOT DETECTED Final   A.calcoaceticus-baumannii NOT DETECTED NOT DETECTED Final   Bacteroides fragilis NOT DETECTED NOT DETECTED Final   Enterobacterales DETECTED (A) NOT DETECTED Final    Comment: Enterobacterales represent a large order of gram negative bacteria, not a single organism. CRITICAL RESULT CALLED TO, READ BACK BY AND VERIFIED WITH: E JACKSON PHARMD 04/26/20 0251 JDW    Enterobacter cloacae complex NOT DETECTED NOT DETECTED Final   Escherichia coli DETECTED (A) NOT DETECTED Final    Comment: CRITICAL RESULT CALLED TO, READ BACK BY AND VERIFIED WITH: E JACKSON PHARMD 04/26/20 0251 JDW    Klebsiella aerogenes NOT DETECTED NOT DETECTED Final   Klebsiella oxytoca NOT DETECTED NOT DETECTED Final   Klebsiella pneumoniae NOT DETECTED NOT DETECTED Final   Proteus species NOT DETECTED NOT DETECTED Final   Salmonella species NOT DETECTED NOT DETECTED Final   Serratia marcescens NOT DETECTED NOT DETECTED Final   Haemophilus influenzae NOT DETECTED NOT DETECTED Final   Neisseria meningitidis NOT DETECTED NOT DETECTED Final   Pseudomonas aeruginosa NOT DETECTED NOT DETECTED Final   Stenotrophomonas maltophilia NOT DETECTED NOT DETECTED Final   Candida albicans NOT DETECTED NOT DETECTED Final   Candida auris NOT DETECTED NOT DETECTED Final   Candida glabrata NOT DETECTED NOT DETECTED Final   Candida krusei NOT DETECTED NOT DETECTED Final   Candida parapsilosis NOT DETECTED NOT DETECTED Final   Candida tropicalis NOT DETECTED NOT DETECTED Final   Cryptococcus neoformans/gattii NOT DETECTED NOT DETECTED Final   CTX-M ESBL DETECTED (A) NOT DETECTED Final    Comment: CRITICAL RESULT CALLED TO, READ BACK BY AND VERIFIED WITH: E JACKSON  PHARMD 04/26/20 0251 JDW (NOTE) Extended spectrum beta-lactamase detected. Recommend a carbapenem as initial therapy.      Carbapenem resistance IMP NOT DETECTED NOT DETECTED Final   Carbapenem resistance KPC NOT DETECTED NOT DETECTED Final   Carbapenem resistance NDM NOT DETECTED NOT DETECTED Final   Carbapenem resist OXA 48 LIKE NOT DETECTED NOT DETECTED Final   Carbapenem resistance VIM NOT DETECTED NOT DETECTED Final    Comment: Performed at [facility], [address], [place], [place] [address]  Respiratory Panel by RT PCR (Flu A&B, Covid) - Urine, Catheterized     Status: None   Collection Time: 04/25/20 12:14 PM   Specimen: Urine, Catheterized; Nasopharyngeal  Result Value Ref Range Status   SARS Coronavirus 2 by RT PCR NEGATIVE NEGATIVE Final    Comment: (NOTE) SARS-CoV-2 target nucleic acids are NOT DETECTED.  The SARS-CoV-2 RNA is generally detectable in upper respiratoy specimens during the acute phase of infection. The lowest concentration of SARS-CoV-2 viral copies this assay can detect is 131 copies/mL. A negative result does not preclude SARS-Cov-2 infection and should not be used as the sole basis for treatment or other patient management decisions. A negative result may  occur with  improper specimen collection/handling, submission of specimen other than nasopharyngeal swab, presence of viral mutation(s) within the areas targeted by this assay, and inadequate number of viral copies (<131 copies/mL). A negative result must be combined with clinical observations, patient history, and epidemiological information. The expected result is Negative.  Fact Sheet for Patients:  https://www.moore.com/  Fact Sheet for Healthcare Providers:  https://www.young.biz/  This test is no t yet approved or cleared by the [place] FDA and  has been authorized for detection and/or diagnosis of SARS-CoV-2 by FDA under an Emergency  Use Authorization (EUA). This EUA will remain  in effect (meaning this test can be used) for the duration of the COVID-19 declaration under Section 564(b)(1) of the Act, 21 U.S.C. section 360bbb-3(b)(1), unless the authorization is terminated or revoked sooner.     Influenza A by PCR NEGATIVE NEGATIVE Final   Influenza B by PCR NEGATIVE NEGATIVE Final    Comment: (NOTE) The Xpert Xpress SARS-CoV-2/FLU/RSV assay is intended as an aid in  the diagnosis of influenza from Nasopharyngeal swab specimens and  should not be used as a sole basis for treatment. Nasal washings and  aspirates are unacceptable for Xpert Xpress SARS-CoV-2/FLU/RSV  testing.  Fact Sheet for Patients: https://www.moore.com/  Fact Sheet for Healthcare Providers: https://www.young.biz/  This test is not yet approved or cleared by the [place] FDA and  has been authorized for detection and/or diagnosis of SARS-CoV-2 by  FDA under an Emergency Use Authorization (EUA). This EUA will remain  in effect (meaning this test can be used) for the duration of the  Covid-19 declaration under Section 564(b)(1) of the Act, 21  U.S.C. section 360bbb-3(b)(1), unless the authorization is  terminated or revoked. Performed at [facility], [address], [place], [place] [address]     Radiology Reports DG Abdomen 1 View  Result Date: 04/25/2020 CLINICAL DATA:  Abdominal pain EXAM: ABDOMEN - 1 VIEW COMPARISON:  CT 01/27/2020 FINDINGS: Nonobstructive bowel gas pattern. No organomegaly or free air. Rounded calcified structures in the pelvis measuring up to 1.7 cm. These correspond to the bladder stones seen on prior CT. Vascular calcifications. Diffuse degenerative changes in the lumbar spine. No acute bony abnormality. IMPRESSION: Bladder stones again noted as seen on prior CT. No bowel obstruction. No acute findings. Electronically Signed   By: Charlett Nose M.D.   On:  04/25/2020 18:37   CT Head Wo Contrast  Result Date: 04/25/2020 CLINICAL DATA:  Mental status change. Nonverbal. Lethargy. Renal insufficiency. EXAM: CT HEAD WITHOUT CONTRAST TECHNIQUE: Contiguous axial images were obtained from the base of the skull through the vertex without intravenous contrast. COMPARISON:  03/03/2020 FINDINGS: Brain: Advanced cerebral/cerebellar atrophy. Marked low density in the periventricular white matter likely related to small vessel disease. No mass lesion, hemorrhage, hydrocephalus, acute infarct, intra-axial, or extra-axial fluid collection. Vascular: Intracranial atherosclerosis. Skull: Motion degradation.  No significant soft tissue swelling. Sinuses/Orbits: Normal imaged portions of the orbits and globes. Clear paranasal sinuses and mastoid air cells. Other: None. IMPRESSION: 1. Motion degradation. 2. No acute intracranial abnormality. 3. Cerebral atrophy and small vessel ischemic change. Electronically Signed   By: Jeronimo Greaves M.D.   On: 04/25/2020 14:30   US RENAL  Result Date: 04/25/2020 CLINICAL DATA:  Acute kidney injury EXAM: RENAL / URINARY TRACT ULTRASOUND COMPLETE COMPARISON:  None. FINDINGS: Right Kidney: Renal measurements: 9.4 x 4.3 x 4.6 cm = volume: 96.8 mL. Echogenicity within normal limits. Mild pelvicaliectasis is seen. No shadowing stones  are noted. Left Kidney: Renal measurements: 11.1 x 5.8 x 5.7 cm = volume: 192 mL. Echogenicity within normal limits. Mild pelvicaliectasis is seen. No shadowing stones are noted. Bladder: Foley catheter is noted. Other: None. IMPRESSION: Mild bilateral hydronephrosis.  No shadowing stones. Electronically Signed   By: Jonna Clark M.D.   On: 04/25/2020 20:47   DG Chest Port 1 View  Result Date: 04/25/2020 CLINICAL DATA:  Weakness. EXAM: PORTABLE CHEST 1 VIEW COMPARISON:  Chest radiograph 03/03/2020 and earlier. Chest CT 02/04/2020. FINDINGS: Shallow inspiration radiograph. Heart size at the upper limits of normal,  unchanged. Aortic atherosclerosis. No appreciable airspace consolidation or pulmonary edema. No evidence of pleural effusion or pneumothorax. No acute bony abnormality identified. IMPRESSION: Shallow inspiration radiograph. No evidence of acute cardiopulmonary abnormality. Aortic Atherosclerosis (ICD10-I70.0). Electronically Signed   By: Jackey Loge DO   On: 04/25/2020 10:49    Lab Data:  CBC: Recent Labs  Lab 04/25/20 1137 04/25/20 1137 04/26/20 0346 04/27/20 0358 04/28/20 0412 04/29/20 0318 04/30/20 0318  WBC 11.6*   < > 9.1 7.5 9.1 7.8 8.2  NEUTROABS 8.6*  --   --  4.7 5.6 4.5 4.5  HGB 10.6*   < > 9.4* 9.4* 10.2* 8.5* 9.1*  HCT 33.7*   < > 29.8* 31.5* 32.8* 28.0* 29.9*  MCV 96.3   < > 95.5 98.4 95.9 96.9 98.4  PLT 346   < > 299 312 346 292 193   < > = values in this interval not displayed.   Basic Metabolic Panel: Recent Labs  Lab 04/25/20 1137 04/26/20 0346 04/27/20 0358 04/28/20 0412 04/29/20 0318  NA 150* 154* 147* 146* 142  K 3.7 4.3 3.4* 3.2* 4.1  CL 115* 121* 119* 116* 115*  CO2 20* 17* 19* 20* 21*  GLUCOSE 133* 113* 119* 128* 107*  BUN 92* 81* 64* 59* 60*  CREATININE 4.16* 3.52* 2.81* 2.36* 2.59*  CALCIUM 10.5* 10.0 9.9 10.0 9.3  MG  --   --  2.2  --   --   PHOS  --   --  2.8  --   --    GFR: Estimated Creatinine Clearance: 23.3 mL/min (A) (by C-G formula based on SCr of 2.59 mg/dL (H)). Liver Function Tests: Recent Labs  Lab 04/25/20 1137 04/26/20 0346 04/27/20 0358 04/28/20 0412 04/29/20 0318  AST 26 24 29 23 26   ALT 23 19 20 17 16   ALKPHOS 91 77 77 75 65  BILITOT 0.6 0.6 0.5 0.6 0.5  PROT 7.4 6.6 6.4* 6.0* 5.3*  ALBUMIN 3.4* 3.1* 3.0* 3.0* 2.5*   Recent Labs  Lab 04/25/20 1137  LIPASE 23   No results for input(s): AMMONIA in the last 168 hours. Coagulation Profile: Recent Labs  Lab 04/25/20 1137  INR 1.2   Cardiac Enzymes: No results for input(s): CKTOTAL, CKMB, CKMBINDEX, TROPONINI in the last 168 hours. BNP (last 3 results) No  results for input(s): PROBNP in the last 8760 hours. HbA1C: No results for input(s): HGBA1C in the last 72 hours. CBG: No results for input(s): GLUCAP in the last 168 hours. Lipid Profile: No results for input(s): CHOL, HDL, LDLCALC, TRIG, CHOLHDL, LDLDIRECT in the last 72 hours. Thyroid Function Tests: No results for input(s): TSH, T4TOTAL, FREET4, T3FREE, THYROIDAB in the last 72 hours. Anemia Panel: No results for input(s): VITAMINB12, FOLATE, FERRITIN, TIBC, IRON, RETICCTPCT in the last 72 hours. Urine analysis:    Component Value Date/Time   COLORURINE YELLOW 04/25/2020 1137   APPEARANCEUR TURBID (A)  04/25/2020 1137   LABSPEC 1.011 04/25/2020 1137   PHURINE 6.0 04/25/2020 1137   GLUCOSEU NEGATIVE 04/25/2020 1137   HGBUR SMALL (A) 04/25/2020 1137   BILIRUBINUR NEGATIVE 04/25/2020 1137   KETONESUR NEGATIVE 04/25/2020 1137   PROTEINUR 100 (A) 04/25/2020 1137   NITRITE NEGATIVE 04/25/2020 1137   LEUKOCYTESUR LARGE (A) 04/25/2020 1137     Tayson Schnelle M.D. [facility] 04/30/2020, 12:20 PM   Call night coverage person covering after 7pm

## 2020-04-30 NOTE — TOC Transition Note (Addendum)
Transition of Care [facility] - CM/SW Discharge Note   Patient Details  Name: Christian Warren. MRN: [id number] Date of Birth: 06-18-1932  Transition of [facility] CM/SW Contact:  Clearance Coots, LCSW Phone Number: 04/30/2020, 3:36 PM   Clinical Narrative:    [facility] ready to accept the patient today.  Nurse call report to: [phone] PTAR called to transport/Niece notified.  DNR signed.   APS notified.   Final next level of care: Hospice Medical Facility Barriers to Discharge: Barriers Resolved   Patient Goals and CMS Choice   CMS Medicare.gov Compare Post Acute Care list provided to:: Other (Comment Required) (Niece Dois Davenport) Choice offered to / list presented to :  (Niece Dois [place]en[place])  Discharge Placement                       Discharge Plan and Services     Post Acute Care Choice: Skilled Nursing Facility                               Social Determinants of Health (SDOH) Interventions     Readmission Risk Interventions No flowsheet data found.

## 2020-04-30 NOTE — Progress Notes (Signed)
Report given to [facility], the receiving nurse at [facility]. Waiting for transport to arrive at this time.

## 2020-04-30 NOTE — Progress Notes (Signed)
Palliative:  86 y.o. male  with past medical history of dementia, hypertension, BPH, chronic indwelling foley, CKD stage 3a admitted on 04/25/2020 with abd pain, tachycardia, confusion with UTI in setting of underlying dementia along with AKI and hypernatremia. Also found to have bacteremia. Refusing intake and pills. High risk for aspiration with severe dysphagia. Completed MOST form reviewed in electronic chart confirming DNR and NO feeding tubes.   I met today again with Christian Warren. He is restless and sideways in bed with mittens. Continues in same state of lethargy and confusion as he did Saturday when I met with him. Noted he was more alert at one point yesterday and had a little intake but he continues at high risk of aspiration and does not exhibit interest in intake. I spoke again with niece, Lovey Newcomer, and provided update and continue to recommend hospice facility for comfort care as he is very high aspiration risk and even without risk I do not expect him to meet nutritional/hydration needs. Lovey Newcomer agrees and main goal is that he is comfortable and does not suffer. She would like to pursue placement at hospice facility with [facility]. She had questions regarding payment with hospice and she knows to expect phone call from hospice liaison who can speak to this better than I.   All questions/concerns addressed. Emotional support provided. Updated Dr. Tana Coast, RN, [place].   Exam: Lethargic. No eye opening or following commands. Responds verbally sometimes but difficult to understand. Verbal responses are not consistent. Intermittent restlessness but otherwise appears comfortable. Breathing regular, unlabored. Abd soft. Moves all extremities.   Plan: - Transition to hospice facility for comfort care.   [place], NP Palliative Medicine Team Pager [phone] (Please see [email or web address] for schedule) Team Phone [phone]    Greater than 50%  of this time was spent counseling and  coordinating care related to the above assessment and plan

## 2020-04-30 NOTE — Progress Notes (Signed)
   Referral received from [facility] and SW for [facility] in [place]. I spoke to the [facility] and she does confim interest and would like to proceed with workup for the inpt facility, Our MD has agreed pt is appropriate and Sandi signed paperwork by doc u sign agreement.   Thank you for this referral pt will be d/c to the Hospice home in [facility] today.  The number for RN to call report is [phone]  Norm Parcel RN [phone]

## 2020-06-09 DEATH — deceased

## 2022-06-19 IMAGING — CR DG KNEE COMPLETE 4+V*L*
4 series · 4 of 4 positions shown · non-contrast
Comparison: None.

CLINICAL DATA: Pain after fall

EXAM:
LEFT KNEE - COMPLETE 4+ VIEW

[t knee obl left]
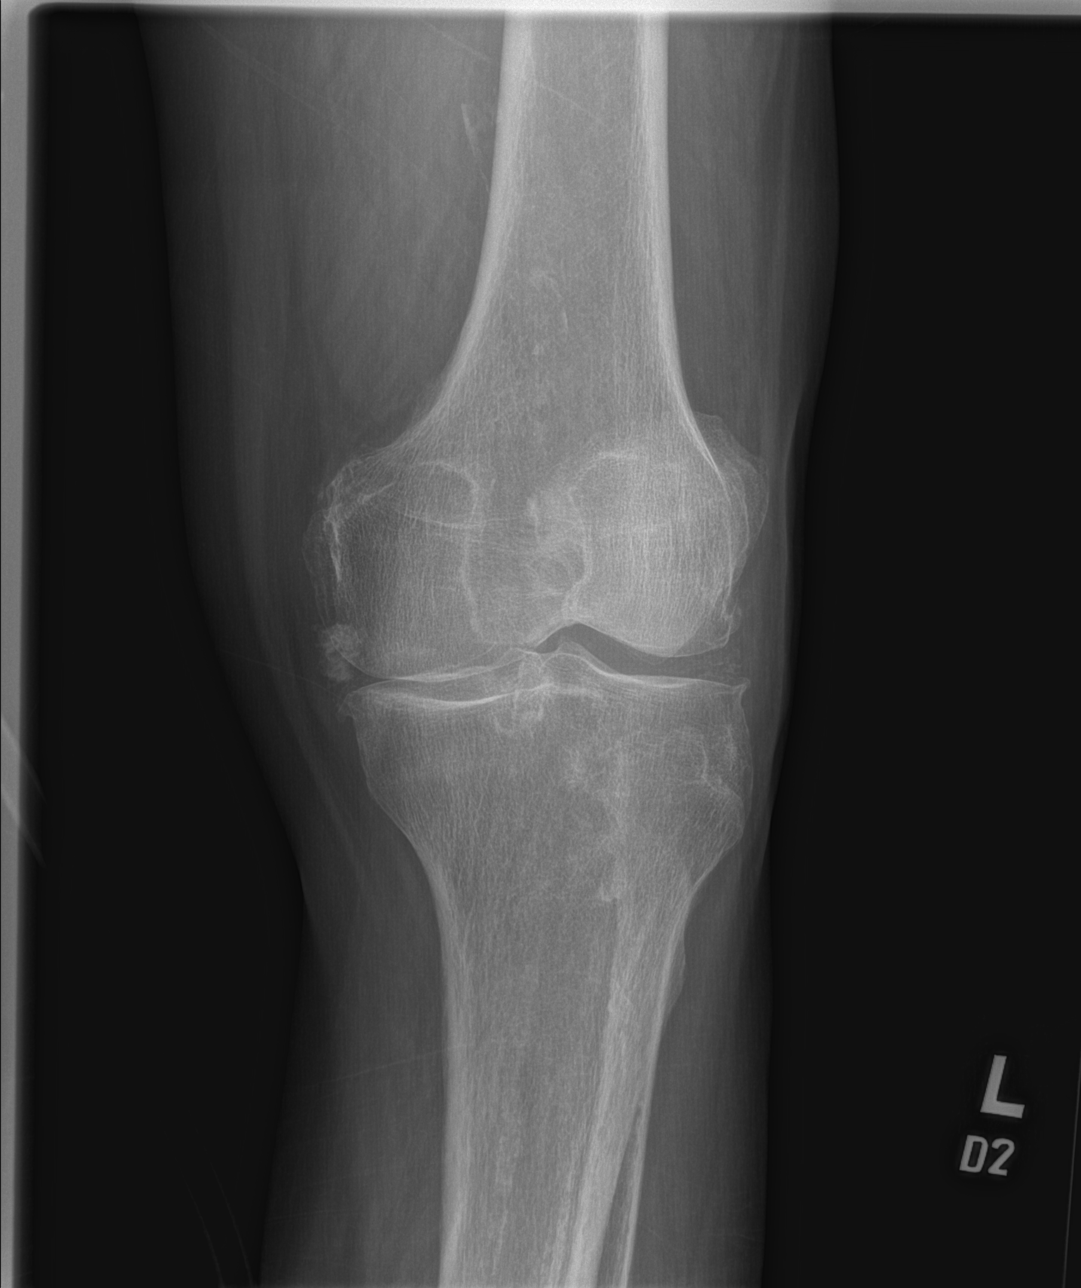

[t knee ap left]
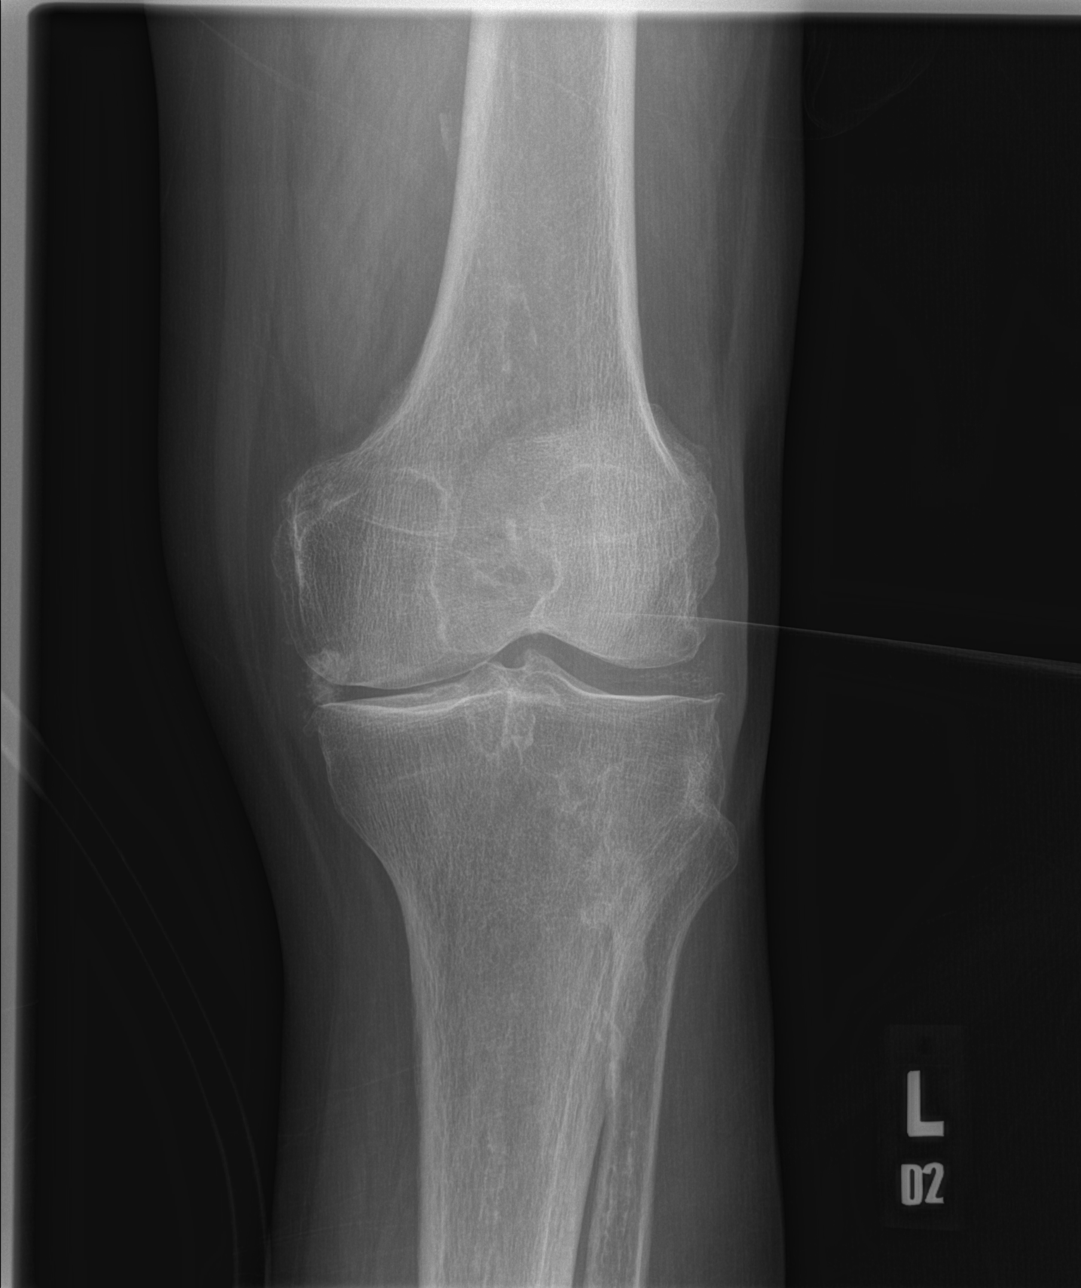

[x knee lat left]
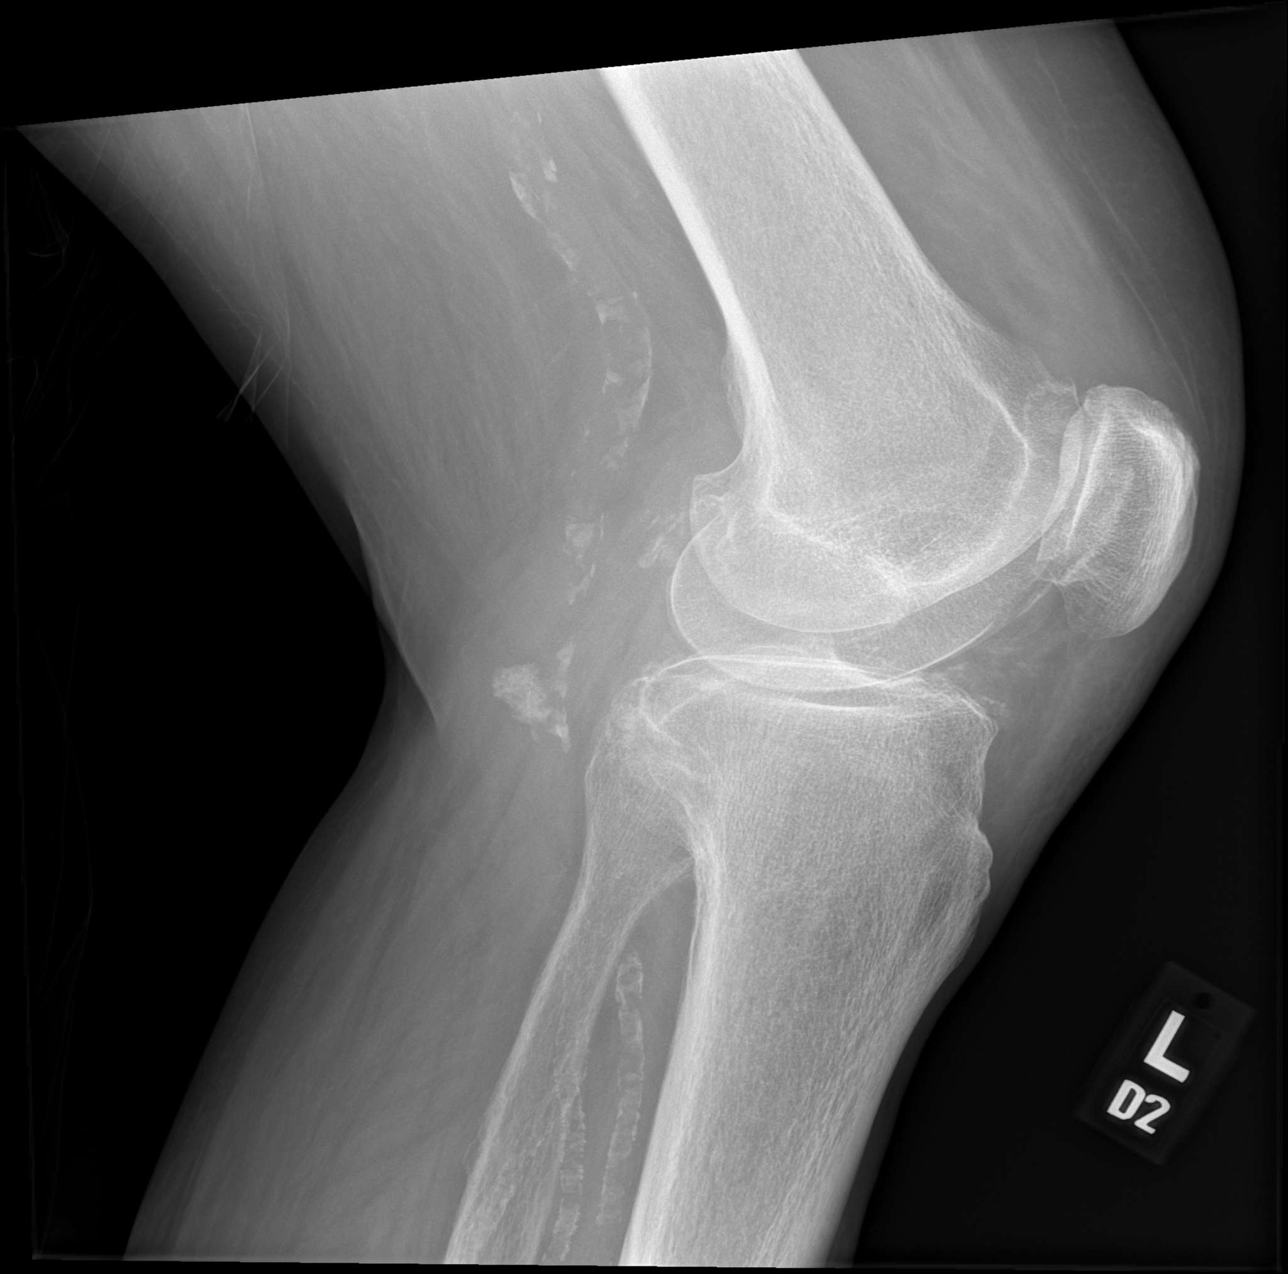

[x knee obl left]
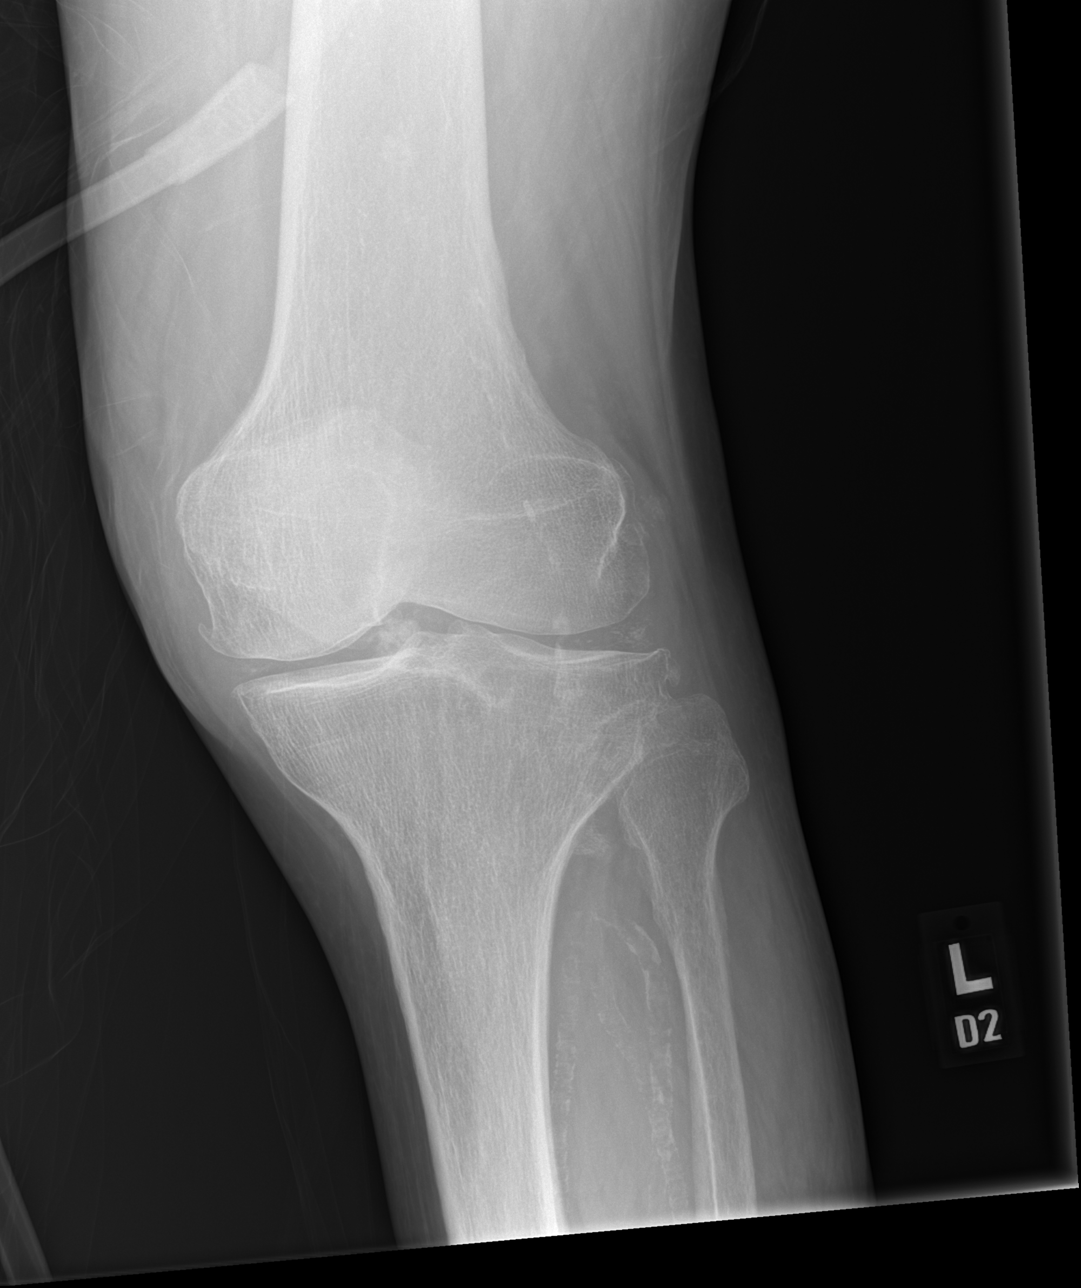

[4 of 4 positions shown; findings below may reference images not displayed]

FINDINGS: Chondrocalcinosis in the lateral compartment. Tricompartmental
degenerative changes. No effusion. No fracture.
IMPRESSION: Chondrocalcinosis consistent with CPPD. Tricompartmental
degenerative changes. No fracture or effusion.
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
## 2022-06-19 IMAGING — CR DG ELBOW COMPLETE 3+V*L*
4 series · 4 of 4 positions shown · non-contrast
Comparison: None.

CLINICAL DATA: Pain after fall

EXAM:
LEFT ELBOW - COMPLETE 3+ VIEW

[x elbow ap left]
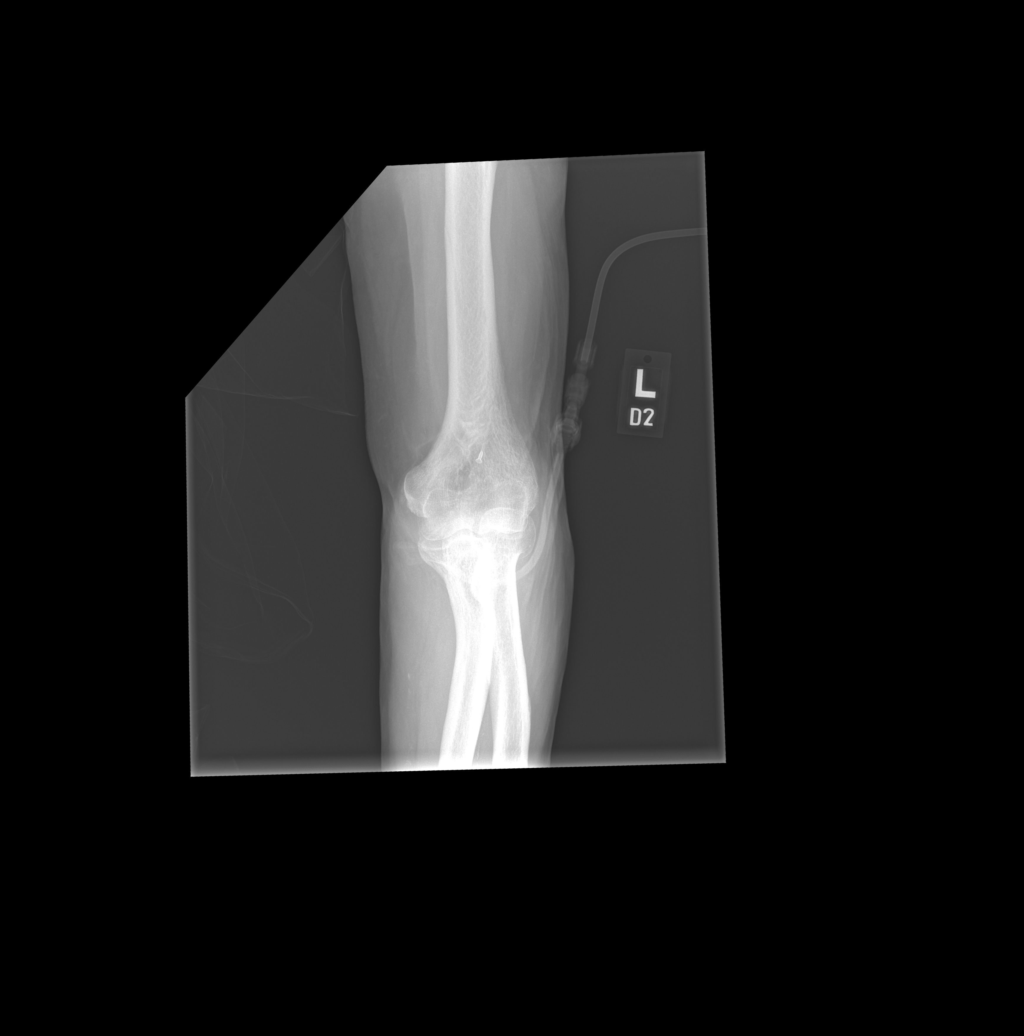

[x elbow obl left (1 of 2)]
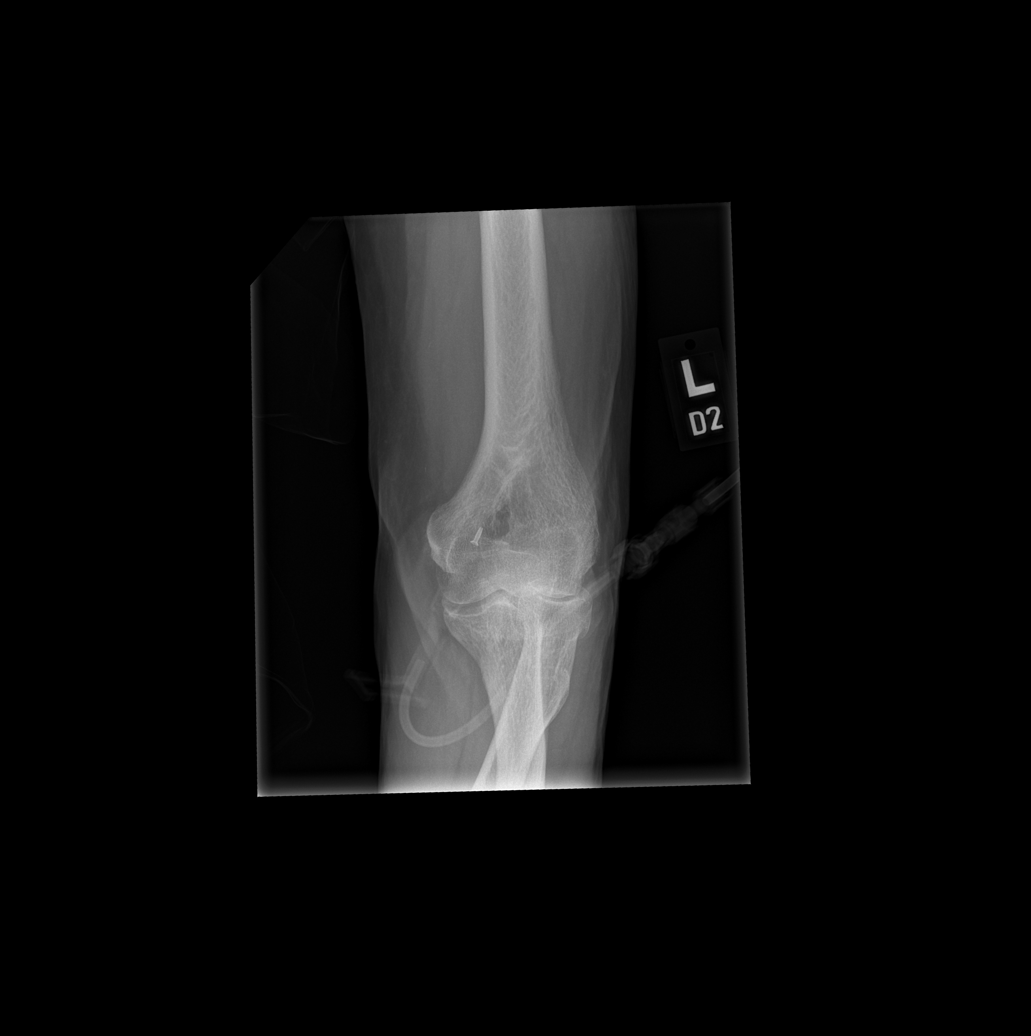

[x elbow obl left (2 of 2)]
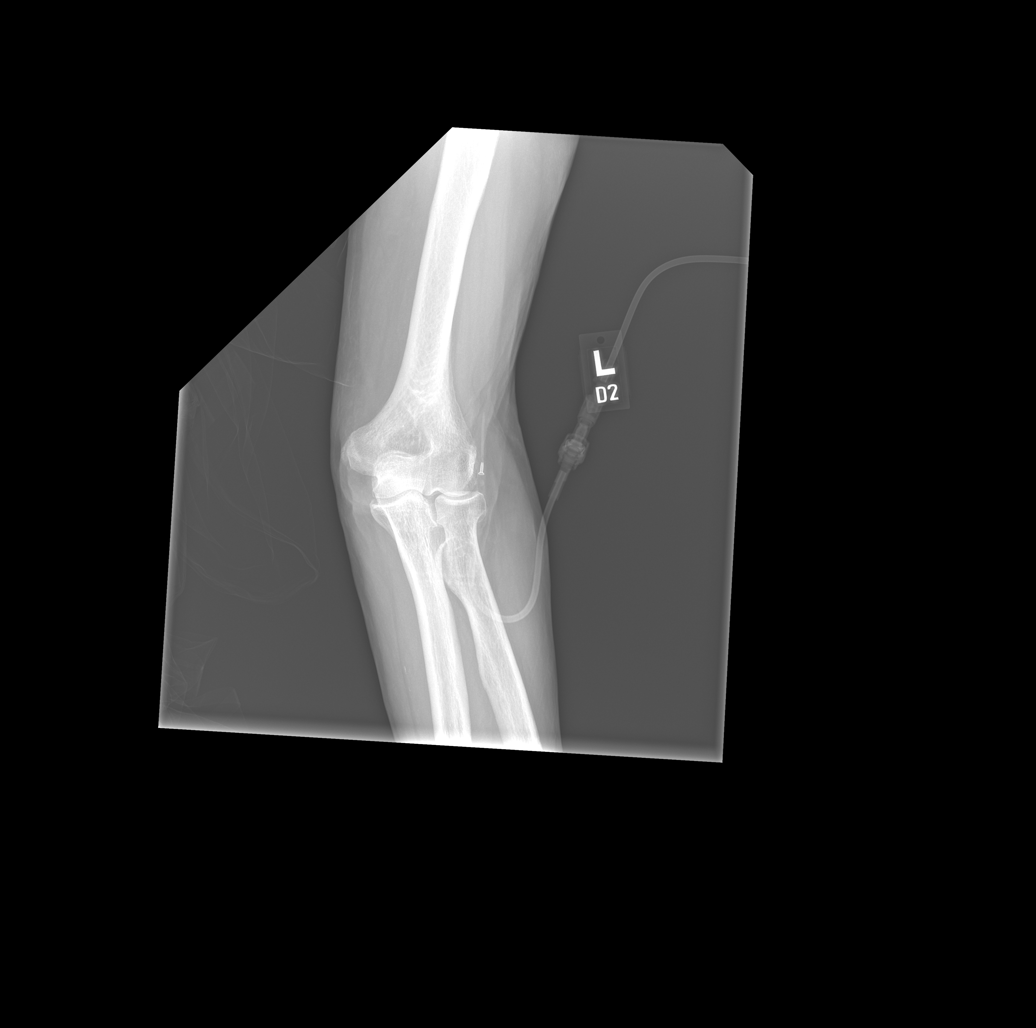

[x elbow lat left]
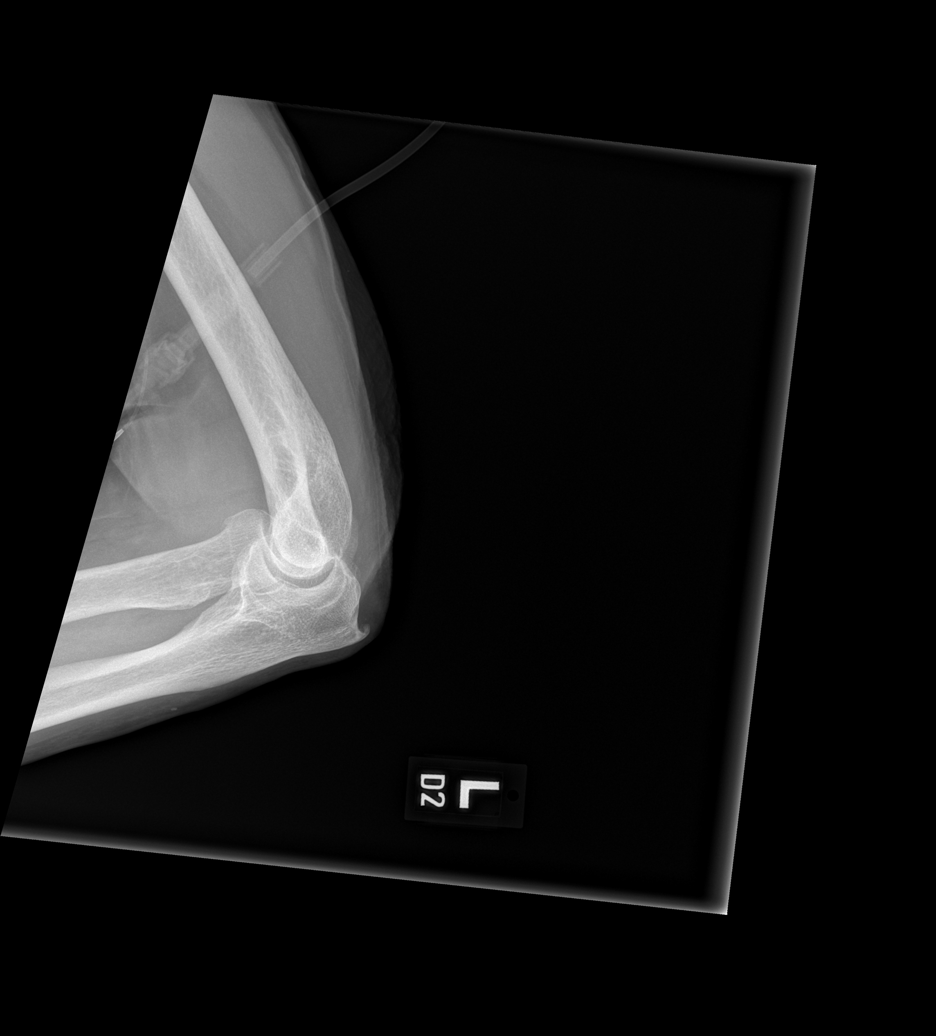

[4 of 4 positions shown; findings below may reference images not displayed]

FINDINGS: Enthesopathic changes at the triceps insertion site on the olecranon
process. No joint effusion. There is an osteophyte off the radial
head. No fractures are noted.
IMPRESSION: Degenerative and enthesopathic changes.  No fractures identified.
